# Patient Record
Sex: Female | Born: 1965 | Race: Black or African American | Hispanic: No | Marital: Single | Smoking: Never smoker
Health system: Southern US, Community
[De-identification: ages and names within clinical notes are randomized; demographics above are authoritative.]

## PROBLEM LIST (undated history)

## (undated) ENCOUNTER — Encounter

## (undated) ENCOUNTER — Telehealth

## (undated) ENCOUNTER — Encounter: Attending: Mental Health

## (undated) ENCOUNTER — Ambulatory Visit

## (undated) ENCOUNTER — Encounter: Attending: Physician Assistant

## (undated) ENCOUNTER — Inpatient Hospital Stay

## (undated) ENCOUNTER — Other Ambulatory Visit

## (undated) ENCOUNTER — Encounter: Attending: Internal Medicine

## (undated) ENCOUNTER — Telehealth: Attending: Internal Medicine

## (undated) DIAGNOSIS — I2699 Other pulmonary embolism without acute cor pulmonale: Secondary | ICD-10-CM

## (undated) DIAGNOSIS — I639 Cerebral infarction, unspecified: Secondary | ICD-10-CM

## (undated) DIAGNOSIS — M1990 Unspecified osteoarthritis, unspecified site: Secondary | ICD-10-CM

## (undated) DIAGNOSIS — M797 Fibromyalgia: Secondary | ICD-10-CM

## (undated) DIAGNOSIS — M3500 Sicca syndrome, unspecified: Secondary | ICD-10-CM

## (undated) DIAGNOSIS — M329 Systemic lupus erythematosus, unspecified: Secondary | ICD-10-CM

## (undated) DIAGNOSIS — I201 Angina pectoris with documented spasm: Secondary | ICD-10-CM

## (undated) DIAGNOSIS — IMO0002 Reserved for concepts with insufficient information to code with codable children: Secondary | ICD-10-CM

## (undated) HISTORY — PX: CHOLECYSTECTOMY: SHX55

---

## 2016-07-09 DIAGNOSIS — R748 Abnormal levels of other serum enzymes: Principal | ICD-10-CM

## 2016-07-09 DIAGNOSIS — K7200 Acute and subacute hepatic failure without coma: Secondary | ICD-10-CM

## 2016-07-09 DIAGNOSIS — R197 Diarrhea, unspecified: Secondary | ICD-10-CM

## 2016-07-17 ENCOUNTER — Encounter: Attending: Physician Assistant | Primary: Internal Medicine

## 2016-08-20 ENCOUNTER — Encounter: Attending: Physician Assistant | Primary: Internal Medicine

## 2016-08-22 ENCOUNTER — Encounter: Attending: Internal Medicine | Primary: Internal Medicine

## 2016-11-19 DIAGNOSIS — M329 Systemic lupus erythematosus, unspecified: Secondary | ICD-10-CM

## 2016-11-19 DIAGNOSIS — G43009 Migraine without aura, not intractable, without status migrainosus: Principal | ICD-10-CM

## 2016-11-19 DIAGNOSIS — M3500 Sicca syndrome, unspecified: Secondary | ICD-10-CM

## 2016-11-20 MED ORDER — TOPIRAMATE 50 MG PO TABS
0 refills | Status: CP
Start: 2016-11-20 — End: 2017-01-21

## 2016-12-24 ENCOUNTER — Ambulatory Visit: Attending: Internal Medicine | Primary: Internal Medicine

## 2016-12-24 DIAGNOSIS — IMO0002 Atrial fib/flutter, transient: Secondary | ICD-10-CM

## 2016-12-24 DIAGNOSIS — M1990 Unspecified osteoarthritis, unspecified site: Secondary | ICD-10-CM

## 2016-12-24 DIAGNOSIS — G459 Transient cerebral ischemic attack, unspecified: Secondary | ICD-10-CM

## 2016-12-24 DIAGNOSIS — I2510 Atherosclerotic heart disease of native coronary artery without angina pectoris: Secondary | ICD-10-CM

## 2016-12-24 DIAGNOSIS — M797 Fibromyalgia: Secondary | ICD-10-CM

## 2016-12-24 DIAGNOSIS — J449 Chronic obstructive pulmonary disease, unspecified: Secondary | ICD-10-CM

## 2016-12-24 DIAGNOSIS — M3500 Sicca syndrome, unspecified: Secondary | ICD-10-CM

## 2016-12-24 DIAGNOSIS — I2699 Other pulmonary embolism without acute cor pulmonale: Secondary | ICD-10-CM

## 2016-12-24 DIAGNOSIS — M3501 Sicca syndrome with keratoconjunctivitis: Secondary | ICD-10-CM

## 2016-12-24 DIAGNOSIS — I8290 Acute embolism and thrombosis of unspecified vein: Secondary | ICD-10-CM

## 2016-12-24 DIAGNOSIS — E785 Hyperlipidemia, unspecified: Secondary | ICD-10-CM

## 2016-12-24 DIAGNOSIS — M329 Systemic lupus erythematosus, unspecified: Principal | ICD-10-CM

## 2016-12-24 DIAGNOSIS — I10 Essential (primary) hypertension: Principal | ICD-10-CM

## 2016-12-24 DIAGNOSIS — R251 Tremor, unspecified: Secondary | ICD-10-CM

## 2016-12-24 MED ORDER — CYCLOBENZAPRINE HCL 5 MG PO TABS
5 mg | Freq: Two times a day (BID) | ORAL | 0 refills | Status: CP | PRN
Start: 2016-12-24 — End: 2017-01-15

## 2016-12-24 NOTE — Progress Notes
Teaching/Attestation Statement  I saw and evaluated the patient.  I reviewed the Resident note and agree with the assessment and plan. See Resident's note for details.     Having neck spasm ; switched from baclofen to flexeril  Stop Plaquenil since no eye exam for > 2 years and she c/o poor vision when she took.  # Won't add any other additional meds for Sjogren's without  Evidence of active disease .  # counsel exercise and sleep hygiene

## 2016-12-24 NOTE — Progress Notes
hospitalized 2 times since last being seen for her IBS-D. Denies any other complaints during that time or any surgical procedure. Per pt she still has not see the Ophthalmologist and ran out of her plaquenil about 1 week ago. She also has complaints of continued Sicca symptoms as well as sharp shooting pains in her upper back and shoulders for the past day.    The pain is At   Knees , no swelling Morning stiffness and tender +  Patient describes the level of pain as  7 of 10.   The pain was first noticed    6 Years     It usually lasts  * always there.   When is the problem worst?  No specific time   Does anything make the problem worse?  moving around, walking ,standing , driving ,lying on my side   Does anything help or make the problem better?no     CONSTITUTIONAL SYMPTOMS:No Fever, Weight Loss or Night Sweats , +++fatigue    Review of systems for connective tissue disease revealed:   No Photosensitivity,malar rash ,discoid rash , hair loss, mouth ulcer , dysphagia, dyspnea, chest pain,SOB,  abdominal pain, blood in stool ,easy bruising  paraesthesia, weakness or Raynaud's  + nausea and heart burn     + sicca         ROS FOR FMS: Patient is reviewed for fibromyalgia symptoms.  Sleep difficulty +   Exercise :  None.      Associated symptoms:   Migraine headaches.    Irritable bowel symptoms.  ,Depression:      Review Of Systems: All other ROS were reviewed and were unremarkable      . Rheumatology Family Hx: no family hx related to rheumatology noted  Previous Report(s) Reviewed:  lab reports     Outpatient Medications:  Current Outpatient Medications    Medication Sig Start Date End Date Taking? Authorizing Provider   baclofen (LIORESAL) 10 MG Tablet Take 1 tablet by mouth 2 times daily. 04/04/16  Yes Thway, Myint Vic Blackbird, MD   diltiaZEM (DILT-XR) 180 MG Capsule Extended Release 24 Hour TAKE 1 CAPSULE BY MOUTH DAILY 02/12/16  Yes Gretchen Short, MD

## 2016-12-24 NOTE — Progress Notes
Will stop plaquenil for now as pt has not seen opthalmology since 6/16.   Curently on pilocarpine for Sicca symptoms, now with complaints of worsening dry eye. Pt told to continue with OTC eye drops scheduled 3-4 time daily.  Pt counseled on the need for regular Dental Appointments every 6 months.   Will need Lab work done prior to next appt; for CBC, CMP, ESR, CRP, C3, C4, ds-DNA and UA     FMS: Reports poor sleep with trouble falling asleep and staying asleep as well as daytime fatigue.  Pt counseled on need for better sleep hygiene including setting a scheduled bedtime, no day time napping, appropriate bedroom environment as well scheduled exercise program ( a least 30 mins of activity daily)  Currently on gabapentin and Zoloft  Will stop baclofen at this time and start pt on short course of flexeril to help with muscle spasms.    Clinically stable     follow up 6 -8 weeks with requested labwork.      Noberto Retort, MD   IM PGY-3  12/24/2016  3:40 PM

## 2016-12-24 NOTE — Progress Notes
ALBUMIN/GLOBULIN RATIO 1.2 1.0 - 2.5 (calc)    Total Bilirubin 0.4 0.2 - 1.2 mg/dL    Alkaline Phosphatase 64 33 - 115 U/L    AST 13 10 - 35 U/L    ALT 6 6 - 29 U/L   Urinalysis W Microscopy (No Culture)([facility])   Result Value Ref Range    Color -Ur YELLOW YELLOW    APPEARANCE CLEAR CLEAR    Specific Gravity -Ur 1.019 1.001 - 1.035    pH -Ur 7.5 5.0 - 8.0    Glucose -Ur NEGATIVE NEGATIVE    Bilirubin -Ur NEGATIVE NEGATIVE    Ketones UA NEGATIVE NEGATIVE    Hb -Ur NEGATIVE NEGATIVE    Protein-UA NEGATIVE NEGATIVE    NITRITE NEGATIVE NEGATIVE    Leukocyte Esterase -Ur NEGATIVE NEGATIVE    WBC -Ur NONE SEEN < OR = 5 /HPF    RBC -Ur NONE SEEN < OR = 2 /HPF    Squam Epithel, UA 0-5 < OR = 5 /HPF    BACTERIA NONE SEEN NONE SEEN /HPF    Hyaline Casts, UA NONE SEEN NONE SEEN /LPF   Sedimentation Rate   Result Value Ref Range    SED RATE BY MODIFIED WESTERGREN 17 < OR = 20 mm/h   Anti DNA, Double Stranded   Result Value Ref Range    dsDNA Ab 18 (H) IU/mL   CBC and Differential   Result Value Ref Range    WHITE BLOOD CELL COUNT 3.4 (L) 3.8 - 10.8 Thousand/uL    RBC 4.75 3.80 - 5.10 Million/uL    Hemoglobin 13.4 11.7 - 15.5 g/dL    Hematocrit 40.2 35.0 - 45.0 %    MCV 84.6 80.0 - 100.0 fL    MCH 28.2 27.0 - 33.0 pg    MCHC 33.3 32.0 - 36.0 g/dL    RDW 13.1 11.0 - 15.0 %    Platelets 216 140 - 400 Thousand/uL    MPV 10.1 7.5 - 12.5 fL    Neutrophils Absolute 925 (L) 1500 - 7800 cells/uL    Lymphocytes Absolute 1975 850 - 3900 cells/uL    Monocytes Absolute 350 200 - 950 cells/uL    Eosinophils Absolute 129 15 - 500 cells/uL    Basophils Absolute 20 0 - 200 cells/uL    Neutrophils 27.2 %    LYMPHOCYTES 58.1 %    MONOCYTES 10.3 %    EOSINOPHILS 3.8 %    BASOPHILS 0.6 %       [facility] records  Last visit march 30  SLE Dx 2010 , anti SSA, SSB + , borderline anti ds dna, diffuse arthralgia since 2007  FMS  Complex migraine  But in 2010 labs RF 25, SSA 118, SSB 26, Ds DNA 200 - overall mild disease

## 2016-12-24 NOTE — Progress Notes
hair distribution   Eyes  conjunctivae/corneas clear. PERTL, EOMI    HEENT Lips, mucosa very  dry  .no oral ulceration   Neck supple, symmetrical, trachea midline, no adenopathy,thyroid: not enlarged,  no JVD   Back   Normal posture, alignment  +  tenderness to palpation of spinous processes.? muscle spasm in neck and trapezius   ?   Lungs   Not tachypnea . Normal respiratory effort  clear to auscultation bilaterally       Heart  regular rate and rhythm, S1, S2 normal, no murmur, click, rub or gallop   Abdomen   soft, non-tender. Bowel sounds normal. No masses,  No organomegaly   MSK No objective  synovitis , dactylitis in complete exam.  FMS tender point 16/18*   Extremities extremities normal, atraumatic, no cyanosis or edema   Pulses 2+ and symmetric   Skin Skin color, texture, turgor normal. No rashes or lesions   Lymph nodes Cervical, supraclavicular, and axillary nodes normal.   Neurologic MENTAL STATUS: Alert. Oriented to person, place, and time. Intact recent and remote memory. No aphasia.   CRANIAL NERVES: Cranial nerves II-XII were normal   No motor /sensory deficit        Lab or Diagnostic Testing:     Results for orders placed or performed in visit on 02/15/16   C3 Complement   Result Value Ref Range    C3 Complement 113 90 - 180 mg/dL   C4 Complement   Result Value Ref Range    C4 Complement 21 16 - 47 mg/dL   Comprehensive Metabolic Panel   Result Value Ref Range    Glucose 84 65 - 99 mg/dL    Urea Nitrogen 14 7 - 25 mg/dL    Creatinine 1.01 0.50 - 1.10 mg/dL    EGFR 65 > OR = 60 mL/min/1.57m    Glom Filt Rate, Est African American 76 > OR = 60 mL/min/1.739m   BUN/Creatinine Ratio NOT APPLICABLE 6 - 22 (calc)    Sodium 138 135 - 146 mmol/L    Potassium 3.8 3.5 - 5.3 mmol/L    Chloride 103 98 - 110 mmol/L    CARBON DIOXIDE 23 20 - 31 mmol/L    Calcium 9.7 8.6 - 10.2 mg/dL    Protein, Total 7.9 6.1 - 8.1 g/dL    ALBUMIN 4.3 3.6 - 5.1 g/dL    Globulin 3.6 1.9 - 3.7 g/dL (calc)

## 2016-12-24 NOTE — Progress Notes
Procedure Laterality Date   ? CHOLECYSTECTOMY      C-Section   ? CHOLECYSTECTOMY      Surgery Description: Cholecystectomy;   (Created by Conversion)       Family History:     Family History   Problem Relation Age of Onset   ? Asthma Mother    ? Heart Disease Mother    ? Hypertension Mother    ? Stroke Mother    ? Vision Loss Mother    ? Asthma Brother    ? Birth Defects Brother    ? Early Death Brother    ? Heart Disease Brother    ? High Cholesterol Brother    ? Hypertension Brother    ? Asthma Daughter    ? High Cholesterol Daughter    ? Hypertension Daughter    ? Asthma Maternal Aunt    ? Heart Disease Maternal Aunt    ? Hypertension Maternal Aunt    ? Miscarriages / Stillbirths Maternal Aunt    ? Stroke Maternal Aunt    ? Vision Loss Maternal Aunt    ? Heart Disease Maternal Uncle    ? Hypertension Maternal Uncle    ? Stroke Maternal Uncle    ? Arthritis Maternal Grandmother    ? Asthma Maternal Grandmother    ? Clotting Disorder Maternal Grandmother    ? Heart Disease Maternal Grandmother    ? Hypertension Maternal Grandmother    ? Miscarriages / Stillbirths Maternal Grandmother    ? Vision Loss Maternal Grandmother        Social History:     Social History     Social History   ? Marital status: Single     Spouse name: N/A   ? Number of children: N/A   ? Years of education: N/A     Social History Main Topics   ? Smoking status: Never Smoker   ? Smokeless tobacco: Never Used   ? Alcohol use No   ? Drug use: No   ? Sexual activity: Not Currently     Other Topics Concern   ? None     Social History Narrative       Physical Exam:   BP 128/79 - Pulse 53 - Temp 36.1 ?C (97 ?F) - Resp 14 - Ht 1.702 m (5\' 7" ) - Wt 78.1 kg (172 lb 3.2 oz) - BMI 26.97 kg/m2      General appearance  alert, cooperative, no distress, appears stated age, obese    Psychiatric/Behavioral:  Negative for behavioral problems, confusion and agitation.          Head  Normocephalic, without obvious abnormality, no scalp lesion  normal

## 2016-12-24 NOTE — Progress Notes
gabapentin (NEURONTIN) 600 MG Tablet TAKE 1 TABLET BY MOUTH THREE TIMES DAILY 02/12/16  Yes Gretchen Short, MD   hydroCHLOROthiazide (HYDRODIURIL) 25 MG Tablet TAKE 1 TABLET BY MOUTH DAILY 09/20/15  Yes Gretchen Short, MD   hydroCHLOROthiazide (HYDRODIURIL) 25 MG Tablet TAKE 1 TABLET BY MOUTH DAILY 02/12/16  Yes Gretchen Short, MD   hydroxychloroquine (PLAQUENIL) 200 MG Tablet Take 1 tablet by mouth 2 times daily. 01/31/16  Yes Thway, Myint Myat, MD   magnesium oxide (MAG-OX) 400 MG tablet Take 400 mg by mouth daily.   Yes Information, Historical   meclizine (ANTIVERT) 25 MG Tablet  01/05/15  Yes Information, Historical   moxifloxacin (VIGAMOX) 0.5 % Solution Place 1 drop into both eyes 3 times daily. 03/01/16  Yes Gretchen Short, MD   pilocarpine (SALAGEN) 5 MG Tablet Take 1 tablet by mouth 2 times daily. 02/20/16  Yes Thway, Myint Vic Blackbird, MD   prochlorperazine (COMPAZINE) 10 MG Tablet Take 1 Tablet by mouth every 8 hours as needed. 04/03/15  Yes [name], ARNP   sertraline (ZOLOFT) 50 MG Tablet TAKE 1 TABLET BY MOUTH EVERY DAY 02/12/16  Yes Gretchen Short, MD   solifenacin (VESICARE) 5 MG Tablet Take 1 tablet by mouth daily. 02/12/16  Yes Gretchen Short, MD   topiramate (TOPAMAX) 50 MG PO Tablet TAKE 2 TABLETS BY MOUTH TWICE DAILY 11/20/16  Yes [name], ARNP   vitamin D-2 (ERGOCALCIFEROL) 50000 UNITS Capsule TAKE 1 CAPSULE BY MOUTH 1 TIME A WEEK ON AN EMPTY STOMACH 03/30/15  Yes Thway, Arlyss Gandy, MD       Allergies:  Allergies   Allergen Reactions   ? No Known Drug Allergy      No Known Drug Allergies       PMH:   Past Medical History:   Diagnosis Date   ? Atrial fib/flutter, transient    ? Blood clot in vein    ? CAD (coronary artery disease)    ? COPD (chronic obstructive pulmonary disease)    ? Hyperlipemia    ? Hypertension    ? Lupus    ? Occasional tremors    ? Osteoarthritis    ? Pulmonary embolism    ? Sjogren's syndrome    ? TIA (transient ischemic attack)        PSH:  Past Surgical History:

## 2016-12-24 NOTE — Progress Notes
Ineffecive response to Pla   Rx SSZ 1000mg  bid (started 3/15)  Lack of response to tramadol  Gabapentin 600mg  tid  Pred short taper   MRI lumbar spine -NO sig   One episode of WBC 3.7       Radiology:    05/27/15  [facility]   Electrophysiologic findings:  1. Normal sural sensory conduction studies bilaterally.  2. Absent superficial peroneal sensory responses bilaterally. The symmetric absence responses a normal finding.  3. Reduced amplitude left tibial motor response with a normal distal latency. The right tibial motor conduction was normal.  4. Normal common peroneal motor conductions bilaterally.  5. Absent common peroneal F-wave responses bilaterally. The symmetric absence of responses is sometimes a normal finding and female patients.  6. Prolonged right tibial F-wave latency. The left tibial F-wave latency was normal.  7. Prolonged tibial H reflex responses bilaterally.  ?  Needle EMG: EMG in the left leg was normal in the tibialis anterior and gastrocnemius muscles.  ?  Impression: Electrophysiologic study was abnormal with findings that seem to be most consistent with mild lumbosacral radicular dysfunction as evidenced by the prolonged late responses, low amplitude distal motor response but normal sensory conductions. The absence of any EMG abnormality indicates that there is not significant motor axon loss associated. We did not find any evidence here to suggest that there is a large fiber peripheral neuropathy. Her symptoms do not seem to be consistent with a small fiber neuropathy    ASSESSMENT:     Problem List Items Addressed This Visit     Systemic lupus erythematosus    Sicca syndrome    Sjogrens syndrome    Fibromyalgia          PLAN:     Counseling was given regarding impression.     Sjogren's /SLE: She has Pos ANA 1:1280 in 2012. Previously treated with PLa and SSZ at [facility] .  Now repeat labs consistent with  + SSA , negative SSB, mildly low C4 and low level pos anti Ds DNA   Negative RF and ant CCP

## 2016-12-24 NOTE — Progress Notes
Department of Rheumatology    Patient: Nichole Colon  MRN: [id number]  DOB: 1966/06/03        Chief Complaint:  Follow-up (6 Months)    Patient Active Problem List   Diagnosis   ? Plantar fascial fibromatosis   ? Systemic lupus erythematosus   ? Sicca syndrome   ? Osteoarthrosis involving lower leg   ? Other disorder of menstruation and other abnormal bleeding from female genital tract   ? Unspecified transient cerebral ischemia   ? Other chronic pain   ? Other malaise and fatigue   ? Routine general medical examination at a health care facility   ? Allergy, unspecified not elsewhere classified   ? Chronic airway obstruction, not elsewhere classified   ? Essential hypertension   ? Gonococcal infection (acute) of lower genitourinary tract   ? Other and unspecified hyperlipidemia   ? Pain in limb   ? Sjogrens syndrome   ? Fibromyalgia   ? Migraine without aura and without status migrainosus, not intractable   ? Chronic bilateral low back pain without sciatica   ? OAB (overactive bladder)   ? Body mass index 28.0-28.9, adult       History of Present Illness:     Nichole Colon is an 51 y.o. AA female who is here for   Sjogren's  Disease and fibromyalgia .    Past history   She has been following  with Dr Goal for several years and established Dx of lupus/Sjogren's, Fibromyalgia and OA .   Her last visit was 03/14/14. Her insurance changed   She was Dx  Sj syndrome  in 2010 , was treated with Plaquenil then switched to SSZ . She was on SSZ 2 months and stopped because of nausea . Plq also gave her dry skin and it was stopped .  She was not on any med in her first visit 03/06/15  except for FMS.  Neurontin, baclofen and mobic prn   I also received notes from [facility]   They Dx SLE (2010) anti SSA, SSB  + , borderline Ds DNA , negative APL study   She was restarted PLq and salagen (03/30/2015)  .      12/24/16   She is here for follow up. Last seen 3/17. Reports that she was

## 2017-01-13 ENCOUNTER — Ambulatory Visit: Attending: Ophthalmology | Primary: Internal Medicine

## 2017-01-13 DIAGNOSIS — M1990 Unspecified osteoarthritis, unspecified site: Secondary | ICD-10-CM

## 2017-01-13 DIAGNOSIS — I8290 Acute embolism and thrombosis of unspecified vein: Secondary | ICD-10-CM

## 2017-01-13 DIAGNOSIS — M3501 Sicca syndrome with keratoconjunctivitis: Principal | ICD-10-CM

## 2017-01-13 DIAGNOSIS — R251 Tremor, unspecified: Secondary | ICD-10-CM

## 2017-01-13 DIAGNOSIS — Z79899 Other long term (current) drug therapy: Secondary | ICD-10-CM

## 2017-01-13 DIAGNOSIS — M329 Systemic lupus erythematosus, unspecified: Secondary | ICD-10-CM

## 2017-01-13 DIAGNOSIS — H04123 Dry eye syndrome of bilateral lacrimal glands: Secondary | ICD-10-CM

## 2017-01-13 DIAGNOSIS — E785 Hyperlipidemia, unspecified: Secondary | ICD-10-CM

## 2017-01-13 DIAGNOSIS — H40003 Preglaucoma, unspecified, bilateral: Secondary | ICD-10-CM

## 2017-01-13 DIAGNOSIS — I2510 Atherosclerotic heart disease of native coronary artery without angina pectoris: Secondary | ICD-10-CM

## 2017-01-13 DIAGNOSIS — J449 Chronic obstructive pulmonary disease, unspecified: Secondary | ICD-10-CM

## 2017-01-13 DIAGNOSIS — G459 Transient cerebral ischemic attack, unspecified: Secondary | ICD-10-CM

## 2017-01-13 DIAGNOSIS — I2699 Other pulmonary embolism without acute cor pulmonale: Secondary | ICD-10-CM

## 2017-01-13 DIAGNOSIS — M3500 Sicca syndrome, unspecified: Secondary | ICD-10-CM

## 2017-01-13 DIAGNOSIS — I10 Essential (primary) hypertension: Principal | ICD-10-CM

## 2017-01-13 DIAGNOSIS — IMO0002 Atrial fib/flutter, transient: Secondary | ICD-10-CM

## 2017-01-13 MED ORDER — CYCLOSPORINE 0.05 % OP EMUL
1 [drp] | Freq: Two times a day (BID) | OPHTHALMIC | 11 refills | Status: CP
Start: 2017-01-13 — End: ?

## 2017-01-13 NOTE — Progress Notes
?   High Cholesterol Brother    ? Hypertension Brother    ? Asthma Daughter    ? High Cholesterol Daughter    ? Hypertension Daughter    ? Asthma Maternal Aunt    ? Heart Disease Maternal Aunt    ? Hypertension Maternal Aunt    ? Miscarriages / Stillbirths Maternal Aunt    ? Stroke Maternal Aunt    ? Vision Loss Maternal Aunt    ? Heart Disease Maternal Uncle    ? Hypertension Maternal Uncle    ? Stroke Maternal Uncle    ? Arthritis Maternal Grandmother    ? Asthma Maternal Grandmother    ? Clotting Disorder Maternal Grandmother    ? Heart Disease Maternal Grandmother    ? Hypertension Maternal Grandmother    ? Miscarriages / Stillbirths Maternal Grandmother    ? Vision Loss Maternal Grandmother        Social History     Social History   ? Marital status: Single     Spouse name: N/A   ? Number of children: N/A   ? Years of education: N/A     Occupational History   ? Not on file.     Social History Main Topics   ? Smoking status: Never Smoker   ? Smokeless tobacco: Never Used   ? Alcohol use No   ? Drug use: No   ? Sexual activity: Not Currently     Other Topics Concern   ? Not on file     Social History Narrative       Review of Systems:   Sjogren's disease and KCS      Objective:     Base Eye Exam     Visual Acuity (Snellen - Linear)      Right Left   Dist sc 20/20 20/20   Near cc J1 J1+ (-1)         Tonometry (Applanation, 2:13 PM)      Right Left   Pressure 12 09         Pupils      Dark Light React APD   Right 3 3 Slow None   Left 3 3 Slow None         Visual Fields      Right Left   Result Full Full         Extraocular Movement      Right Left   Result Full Full         Neuro/Psych     Oriented x3:  Yes    Mood/Affect:  Normal      Dilation     Both eyes:  1.0% Mydriacyl @ 2:15 PM            Additional Tests     Color      Right Left   Ishihara 16/16 16/16               Slit Lamp and Fundus Exam     External Exam      Right Left    External Normal Normal      Slit Lamp Exam      Right Left

## 2017-01-13 NOTE — Progress Notes
Visit Date:  01/13/2017     Referring Physician:  Dr. Bryna Colander    Patient Active Problem List    Diagnosis Date Noted   ? Body mass index 28.0-28.9, adult 03/22/2016   ? OAB (overactive bladder) 06/28/2015   ? Chronic bilateral low back pain without sciatica 05/27/2015   ? Migraine without aura and without status migrainosus, not intractable 03/01/2015   ? Sjogrens syndrome 01/30/2015   ? Fibromyalgia 01/30/2015   ? Chronic airway obstruction, not elsewhere classified 03/28/2011   ? Plantar fascial fibromatosis 03/07/2011   ? Pain in limb 03/07/2011   ? Osteoarthrosis involving lower leg 11/06/2010   ? Sicca syndrome 10/19/2010   ? Other disorder of menstruation and other abnormal bleeding from female genital tract 10/12/2010   ? Gonococcal infection (acute) of lower genitourinary tract 10/12/2010   ? Allergy, unspecified not elsewhere classified 08/29/2010   ? Other chronic pain 05/03/2010   ? Systemic lupus erythematosus 04/20/2010   ? Unspecified transient cerebral ischemia 04/20/2010   ? Other malaise and fatigue 04/20/2010   ? Essential hypertension 04/20/2010   ? Other and unspecified hyperlipidemia 04/20/2010   ? Routine general medical examination at a health care facility 12/16/1898       Chief Complaint   Patient presents with   ? Plaquenil Screening              Subjective:     History of Present Illness:  Nichole Colon is a 51 y.o. female who is here today for interval plaquenil screening.    Allergies   Allergen Reactions   ? No Known Drug Allergy      No Known Drug Allergies       Current Outpatient Prescriptions   Medication Sig Dispense Refill   ? cyclobenzaprine (FLEXERIL) 5 MG PO Tablet Take 1 tablet by mouth 2 times daily as needed for muscle spasms. 20 tablet 0   ? diltiaZEM (DILT-XR) 180 MG Capsule Extended Release 24 Hour TAKE 1 CAPSULE BY MOUTH DAILY 90 capsule 3   ? gabapentin (NEURONTIN) 600 MG Tablet TAKE 1 TABLET BY MOUTH THREE TIMES DAILY 270 tablet 3

## 2017-01-13 NOTE — Progress Notes
?   hydroCHLOROthiazide (HYDRODIURIL) 25 MG Tablet TAKE 1 TABLET BY MOUTH DAILY 30 Tablet 0   ? hydroCHLOROthiazide (HYDRODIURIL) 25 MG Tablet TAKE 1 TABLET BY MOUTH DAILY 90 tablet 3   ? hydroxychloroquine (PLAQUENIL) 200 MG Tablet Take 1 tablet by mouth 2 times daily. 90 tablet 3   ? magnesium oxide (MAG-OX) 400 MG tablet Take 400 mg by mouth daily.     ? meclizine (ANTIVERT) 25 MG Tablet      ? moxifloxacin (VIGAMOX) 0.5 % Solution Place 1 drop into both eyes 3 times daily. 3 mL 0   ? pilocarpine (SALAGEN) 5 MG Tablet Take 1 tablet by mouth 2 times daily. 60 tablet 3   ? prochlorperazine (COMPAZINE) 10 MG Tablet Take 1 Tablet by mouth every 8 hours as needed. 40 Tablet 3   ? sertraline (ZOLOFT) 50 MG Tablet TAKE 1 TABLET BY MOUTH EVERY DAY 90 tablet 3   ? solifenacin (VESICARE) 5 MG Tablet Take 1 tablet by mouth daily. 90 tablet 3   ? topiramate (TOPAMAX) 50 MG PO Tablet TAKE 2 TABLETS BY MOUTH TWICE DAILY 360 tablet 0   ? vitamin D-2 (ERGOCALCIFEROL) 50000 UNITS Capsule TAKE 1 CAPSULE BY MOUTH 1 TIME A WEEK ON AN EMPTY STOMACH 13 capsule 0     No current facility-administered medications for this visit.         Past Medical History:   Diagnosis Date   ? Atrial fib/flutter, transient    ? Blood clot in vein    ? CAD (coronary artery disease)    ? COPD (chronic obstructive pulmonary disease)    ? Hyperlipemia    ? Hypertension    ? Lupus    ? Occasional tremors    ? Osteoarthritis    ? Pulmonary embolism    ? Sjogren's syndrome    ? TIA (transient ischemic attack)        Past Surgical History:   Procedure Laterality Date   ? CHOLECYSTECTOMY      C-Section   ? CHOLECYSTECTOMY      Surgery Description: Cholecystectomy;   (Created by Conversion)        Family History   Problem Relation Age of Onset   ? Asthma Mother    ? Heart Disease Mother    ? Hypertension Mother    ? Stroke Mother    ? Vision Loss Mother    ? Asthma Brother    ? Birth Defects Brother    ? Early Death Brother    ? Heart Disease Brother

## 2017-01-13 NOTE — Progress Notes
Lids/Lashes Normal Normal    Conjunctiva/Sclera White and quiet White and quiet    Cornea tr pee tr pee    Anterior Chamber Deep and quiet Deep and quiet    Iris Round and reactive Round and reactive    Lens Clear Clear    Vitreous Normal Normal      Fundus Exam      Right Left    Disc Normal Normal    C/D Ratio 0.4 0.55    Macula Normal Normal    Vessels Normal Superior vein emergens nasally and then loops out    Periphery Normal Normal            Refraction     Wearing Rx      Sphere   Right +2.00   Left +2.00       Type:  SVL (Reading Glasses)                No notes on file    Other testing:   None      Assessment and Plan:       ICD-10-CM ICD-9-CM    1. Sjogren's syndrome with keratoconjunctivitis sicca M35.01 710.2     Start Restasis OU BID and Pred-forte 1 gtt OU QID x 14 days.   2. Long-term use of Plaquenil Z79.899 V58.69     Schedule HVF today for next week, OCT today is WNL OU.   3. Dry eyes, bilateral H04.123 375.15     Ats OU QID.   4. Glaucoma suspect, bilateral H40.003 365.00     CDR asymmetry, schedule OCT and HVF for glaucoma suspect.          Return to clinic:  2  week(s) for HVF 24-2 for glaucoma suspect and 10-2 red target , OCT for ON    Liam Rogers, MD

## 2017-01-13 NOTE — Addendum Note
Addended by: Liam Rogers on: 01/13/2017 02:59 PM      Modules accepted: Orders

## 2017-01-14 DIAGNOSIS — M3501 Sicca syndrome with keratoconjunctivitis: Principal | ICD-10-CM

## 2017-01-14 MED ORDER — PREDNISOLONE ACETATE 1 % OP SUSP
1 [drp] | Freq: Four times a day (QID) | OPHTHALMIC | 0 refills | Status: CP
Start: 2017-01-14 — End: 2018-02-17

## 2017-01-14 NOTE — Telephone Encounter
Rx for PF QID OU for 14 days was sent [facility] pharmacy  .  Spoke with the pharmacist and changes restasis to 60 vials in place of 30

## 2017-01-14 NOTE — Telephone Encounter
Rx for PF QID OU for 14 days was sent [facility] pharmacy

## 2017-01-15 ENCOUNTER — Ambulatory Visit: Attending: Internal Medicine | Primary: Internal Medicine

## 2017-01-15 DIAGNOSIS — G459 Transient cerebral ischemic attack, unspecified: Secondary | ICD-10-CM

## 2017-01-15 DIAGNOSIS — Z124 Encounter for screening for malignant neoplasm of cervix: Secondary | ICD-10-CM

## 2017-01-15 DIAGNOSIS — I2510 Atherosclerotic heart disease of native coronary artery without angina pectoris: Secondary | ICD-10-CM

## 2017-01-15 DIAGNOSIS — I10 Essential (primary) hypertension: Principal | ICD-10-CM

## 2017-01-15 DIAGNOSIS — I2699 Other pulmonary embolism without acute cor pulmonale: Secondary | ICD-10-CM

## 2017-01-15 DIAGNOSIS — M329 Systemic lupus erythematosus, unspecified: Secondary | ICD-10-CM

## 2017-01-15 DIAGNOSIS — R609 Edema, unspecified: Secondary | ICD-10-CM

## 2017-01-15 DIAGNOSIS — IMO0002 Atrial fib/flutter, transient: Secondary | ICD-10-CM

## 2017-01-15 DIAGNOSIS — Z1211 Encounter for screening for malignant neoplasm of colon: Secondary | ICD-10-CM

## 2017-01-15 DIAGNOSIS — N3281 Overactive bladder: Secondary | ICD-10-CM

## 2017-01-15 DIAGNOSIS — J449 Chronic obstructive pulmonary disease, unspecified: Secondary | ICD-10-CM

## 2017-01-15 DIAGNOSIS — F323 Major depressive disorder, single episode, severe with psychotic features: Secondary | ICD-10-CM

## 2017-01-15 DIAGNOSIS — Z0000 Encounter for general adult medical examination without abnormal findings: Secondary | ICD-10-CM

## 2017-01-15 DIAGNOSIS — M3500 Sicca syndrome, unspecified: Secondary | ICD-10-CM

## 2017-01-15 DIAGNOSIS — M1990 Unspecified osteoarthritis, unspecified site: Secondary | ICD-10-CM

## 2017-01-15 DIAGNOSIS — R251 Tremor, unspecified: Secondary | ICD-10-CM

## 2017-01-15 DIAGNOSIS — Z6828 Body mass index (BMI) 28.0-28.9, adult: Principal | ICD-10-CM

## 2017-01-15 DIAGNOSIS — E785 Hyperlipidemia, unspecified: Secondary | ICD-10-CM

## 2017-01-15 DIAGNOSIS — E559 Vitamin D deficiency, unspecified: Secondary | ICD-10-CM

## 2017-01-15 DIAGNOSIS — R42 Dizziness and giddiness: Secondary | ICD-10-CM

## 2017-01-15 DIAGNOSIS — I8290 Acute embolism and thrombosis of unspecified vein: Secondary | ICD-10-CM

## 2017-01-15 DIAGNOSIS — Z1231 Encounter for screening mammogram for malignant neoplasm of breast: Secondary | ICD-10-CM

## 2017-01-15 MED ORDER — DILTIAZEM HCL ER 180 MG PO CP24
3 refills | Status: CP
Start: 2017-01-15 — End: 2018-06-17

## 2017-01-15 MED ORDER — ERGOCALCIFEROL 1.25 MG (50000 UT) PO CAPS
0 refills | Status: CP
Start: 2017-01-15 — End: 2018-06-17

## 2017-01-15 MED ORDER — SERTRALINE HCL 50 MG PO TABS
3 refills | Status: CP
Start: 2017-01-15 — End: 2017-09-05

## 2017-01-15 MED ORDER — MECLIZINE HCL 25 MG PO TABS
25 mg | Freq: Three times a day (TID) | ORAL | 2 refills | Status: CP | PRN
Start: 2017-01-15 — End: 2017-02-10

## 2017-01-15 MED ORDER — SOLIFENACIN SUCCINATE 5 MG PO TABS
5 mg | Freq: Every day | ORAL | 3 refills | Status: CP
Start: 2017-01-15 — End: 2017-07-11

## 2017-01-15 MED ORDER — HYDROCHLOROTHIAZIDE 25 MG PO TABS
3 refills | Status: CP
Start: 2017-01-15 — End: 2018-03-13

## 2017-01-15 NOTE — Progress Notes
?   Clotting Disorder Maternal Grandmother    ? Heart Disease Maternal Grandmother    ? Hypertension Maternal Grandmother    ? Miscarriages / Stillbirths Maternal Grandmother    ? Vision Loss Maternal Grandmother      Social History     Social History   ? Marital status: Single     Spouse name: N/A   ? Number of children: N/A   ? Years of education: N/A     Occupational History   ? Not on file.     Social History Main Topics   ? Smoking status: Never Smoker   ? Smokeless tobacco: Never Used   ? Alcohol use No   ? Drug use: No   ? Sexual activity: Not Currently     Other Topics Concern   ? Not on file     Social History Narrative     Current Outpatient Prescriptions on File Prior to Visit   Medication Sig   ? cycloSPORINE (RESTASIS) 0.05 % OP Emulsion Place 1 drop into both eyes every 12 hours.   ? diltiaZEM (DILT-XR) 180 MG Capsule Extended Release 24 Hour TAKE 1 CAPSULE BY MOUTH DAILY   ? gabapentin (NEURONTIN) 600 MG Tablet TAKE 1 TABLET BY MOUTH THREE TIMES DAILY   ? hydroCHLOROthiazide (HYDRODIURIL) 25 MG Tablet TAKE 1 TABLET BY MOUTH DAILY   ? hydroxychloroquine (PLAQUENIL) 200 MG Tablet Take 1 tablet by mouth 2 times daily.   ? [DISCONTINUED] meclizine (ANTIVERT) 25 MG Tablet    ? pilocarpine (SALAGEN) 5 MG Tablet Take 1 tablet by mouth 2 times daily.   ? prednisoLONE acetate (PRED FORTE) 1 % OP Suspension Place 1 drop into both eyes 4 times daily for 14 days.   ? sertraline (ZOLOFT) 50 MG Tablet TAKE 1 TABLET BY MOUTH EVERY DAY   ? [DISCONTINUED] solifenacin (VESICARE) 5 MG Tablet Take 1 tablet by mouth daily.   ? topiramate (TOPAMAX) 50 MG PO Tablet TAKE 2 TABLETS BY MOUTH TWICE DAILY   ? [DISCONTINUED] vitamin D-2 (ERGOCALCIFEROL) 50000 UNITS Capsule TAKE 1 CAPSULE BY MOUTH 1 TIME A WEEK ON AN EMPTY STOMACH   ? [DISCONTINUED] cyclobenzaprine (FLEXERIL) 5 MG PO Tablet Take 1 tablet by mouth 2 times daily as needed for muscle spasms.   ? [DISCONTINUED] hydroCHLOROthiazide (HYDRODIURIL) 25 MG Tablet TAKE 1

## 2017-01-15 NOTE — Progress Notes
Assessment:       ICD-10-CM ICD-9-CM    1. Medicare annual wellness visit, subsequent Z00.00 V70.0    2. BMI 28.0-28.9,adult Z68.28 V85.24    3. Screening mammogram, encounter for Z12.31 V76.12 MAM Screening   4. Screening for malignant neoplasm of cervix Z12.4 V76.2 THINPREP TIS AND HPV mRNA E6/E7      THINPREP TIS AND HPV mRNA E6/E7   5. Hypovitaminosis D E55.9 268.9 ergocalciferol (vitamin D-2) 50000 units PO Capsule   6. OAB (overactive bladder) N32.81 596.51 solifenacin (VESICARE) 5 MG PO Tablet   7. Dizziness R42 780.4 meclizine (ANTIVERT) 25 MG PO Tablet          Plan:     Orders Placed This Encounter   Medications   ? ergocalciferol (vitamin D-2) 50000 units PO Capsule     Sig: TAKE 1 CAPSULE BY MOUTH 1 TIME A WEEK ON AN EMPTY STOMACH     Dispense:  13 capsule     Refill:  0     **Patient requests 90 days supply**   ? solifenacin (VESICARE) 5 MG PO Tablet     Sig: Take 1 tablet by mouth daily.     Dispense:  90 tablet     Refill:  3   ? meclizine (ANTIVERT) 25 MG PO Tablet     Sig: Take 1 tablet by mouth 3 times daily as needed.     Dispense:  60 tablet     Refill:  2     Orders Placed This Encounter   Procedures   ? MAM Screening       Health Maintenance was reviewed. The patient's HM Topic list was:                                            Health Maintenance   Topic Date Due   ? Breast Cancer Screening  02/12/2017 (Originally 03/28/2016)   ? Lipid Profile  02/18/2017   ? Basic Metabolic Panel  [id number]   ? Preventive Wellness Visit  01/15/2018   ? Pap Smear  01/16/2020   ? Colon Cancer Screening  06/15/2023   ? Zoster Vaccine (1) 03/28/2026   ? USPSTF HIV Risk Assessment  Completed   ? Influenza Vaccine  Addressed   ? DTaP,Tdap,and Td Vaccines  Excluded   ? Pneumovax / Prevnar  Excluded

## 2017-01-15 NOTE — Progress Notes
Functional Ability Assessment 01/15/2017   Eating Feeds self completely.   Continence: Complete bowel and/or bladder control, or able to self-cath.   Bathing: Bathes or showers without help.   Dressing: Dresses self completely.   Toileting: Uses toilet by self.   Transferring: Is able to move in and out of bed or chair.   Home Maintenance Independent   Cooking Independent   Laundry Independent   Telephone Independent   Finances Independent   Medications Independent   Transport Independent   Ambulation status Independent   Cognitive status:  The patient  does not have cognitive impairment;does not have problems with memory or concentration;does not have dementia        PHQ-9/Mood Disorder Scores 01/15/2017   PHQ-9 Total Score 0        No flowsheet data found.    Fall risk - Encounter Level              HEDIS 01/15/2017   Lipid Management  3048F - Most recent LDL-C level within 12 months [id number] mg/dL   Systolic blood pressure  0000000 - Systolic blood pressure [id number] Hg   Diastolic blood pressure [id number] - Diastolic blood pressure [id number] Hg   Documentation in record  1157F - Advanced Care Planning documentation in record   Discussion documented in record 1158F - [place] discussion documented in record   Medication list documented 1159F - Medication list documented in medical record   Medication reviewed and reconciled 1160F - Medication review and reconciliation   Functional Status Assessment 1170F - Functional Status Assessment (ADL's, IADL's, Ambulation Status, Cognitive Status)   Eating Feeds self completely.   Continence: Complete bowel and/or bladder control, or able to self-cath.   Bathing: Bathes or showers without help.   Dressing: Dresses self completely.   Toileting: Uses toilet by self.   Transferring: Is able to move in and out of bed or chair.   Home Maintenance Independent   Cooking Independent   Teacher, music

## 2017-01-15 NOTE — Progress Notes
?   Smokeless tobacco: Never Used   ? Alcohol use No     Current Outpatient Prescriptions on File Prior to Visit   Medication Sig   ? cycloSPORINE (RESTASIS) 0.05 % OP Emulsion Place 1 drop into both eyes every 12 hours.   ? diltiaZEM (DILT-XR) 180 MG Capsule Extended Release 24 Hour TAKE 1 CAPSULE BY MOUTH DAILY   ? gabapentin (NEURONTIN) 600 MG Tablet TAKE 1 TABLET BY MOUTH THREE TIMES DAILY   ? hydroCHLOROthiazide (HYDRODIURIL) 25 MG Tablet TAKE 1 TABLET BY MOUTH DAILY   ? hydroxychloroquine (PLAQUENIL) 200 MG Tablet Take 1 tablet by mouth 2 times daily.   ? [DISCONTINUED] meclizine (ANTIVERT) 25 MG Tablet    ? pilocarpine (SALAGEN) 5 MG Tablet Take 1 tablet by mouth 2 times daily.   ? prednisoLONE acetate (PRED FORTE) 1 % OP Suspension Place 1 drop into both eyes 4 times daily for 14 days.   ? sertraline (ZOLOFT) 50 MG Tablet TAKE 1 TABLET BY MOUTH EVERY DAY   ? [DISCONTINUED] solifenacin (VESICARE) 5 MG Tablet Take 1 tablet by mouth daily.   ? topiramate (TOPAMAX) 50 MG PO Tablet TAKE 2 TABLETS BY MOUTH TWICE DAILY   ? [DISCONTINUED] vitamin D-2 (ERGOCALCIFEROL) 50000 UNITS Capsule TAKE 1 CAPSULE BY MOUTH 1 TIME A WEEK ON AN EMPTY STOMACH   ? [DISCONTINUED] cyclobenzaprine (FLEXERIL) 5 MG PO Tablet Take 1 tablet by mouth 2 times daily as needed for muscle spasms.   ? [DISCONTINUED] hydroCHLOROthiazide (HYDRODIURIL) 25 MG Tablet TAKE 1 TABLET BY MOUTH DAILY   ? [DISCONTINUED] magnesium oxide (MAG-OX) 400 MG tablet Take 400 mg by mouth daily.   ? [DISCONTINUED] moxifloxacin (VIGAMOX) 0.5 % Solution Place 1 drop into both eyes 3 times daily.   ? [DISCONTINUED] prochlorperazine (COMPAZINE) 10 MG Tablet Take 1 Tablet by mouth every 8 hours as needed.     No current facility-administered medications on file prior to visit.      Allergies   Allergen Reactions   ? No Known Drug Allergy      No Known Drug Allergies       Health Risk Assessment  General Care Management - Patient Level       Exercise:  yes

## 2017-01-15 NOTE — Progress Notes
?   meclizine (ANTIVERT) 25 MG PO Tablet     Sig: Take 1 tablet by mouth 3 times daily as needed.     Dispense:  60 tablet     Refill:  2     Orders Placed This Encounter   Procedures   ? MAM Screening     No orders of the following type were placed in this encounter: Referrals      During the course of the visit the patient was educated and counseled about appropriate screening and preventive services as follows:  Health Maintenance Reminders       Due Completion Dates    Pap Smear 02/12/2016 02/11/2013 (Done), 04/01/2011    Colon Cancer Screening 03/28/2016 ---    Breast Cancer Screening 02/12/2017 (Originally 03/28/2016) ---    Lipid Profile 02/18/2017 02/19/2016, 0000000    Basic Metabolic Panel [id number], 02/19/2016, 02/19/2016    Preventive Wellness Visit 01/15/2018 01/15/2017 (Done)    Zoster Vaccine (1) 03/28/2026 ---          Health Maintenance Topics with due status: Completed       Topic Last Completion Date    USPSTF HIV Risk Assessment 02/11/2014     Health Maintenance Topics with due status: Excluded       Topic Date Due    DTaP,Tdap,and Td Vaccines Excluded    Pneumovax / Prevnar Excluded       Preventive Health Counseling    Age appropriate anticipatory guidelines discussed with the patient.    Nutrition: Stressed importance of moderation in sodium/caffeine intake, saturated fat and cholesterol, caloric balance, sufficient intake of fresh fruits, vegetables, fiber, calcium, and iron.  Exercise: Stressed the importance of regular exercise.   Substance Abuse: Discussed cessation/primary prevention of tobacco, alcohol, or other drug use; driving or other dangerous activities under the influence; availability of treatment for abuse.   Sexuality: Discussed sexually transmitted diseases, partner selection, and use of condoms.  Dental health: Discussed importance of regular tooth brushing, flossing, and dental visits.  End of Life:  Voluntary advance care planning was held with patient and/or

## 2017-01-15 NOTE — Progress Notes
-  VH at 01/15/17 0813       BP 138/78    -VH at 01/15/17 0813       BP Location Left upper arm    -VH at 01/15/17 0813       Position Sitting    -VH at 01/15/17 0813       Pulse 68    -VH at 01/15/17 0813       Pulse Source Radial    -VH at 01/15/17 0813       Pulse Quality Normal    -VH at 01/15/17 0813       Resp 16    -VH at 01/15/17 0813       Respiration Quality Normal    -VH at 01/15/17 0813       Education/Communication Barriers?    Learning/Communication Barriers? No    -VH at 01/15/17 0813       Fall Risk Assessment    Had recent fall / Last 6 months? No recent fall    -VH at 01/15/17 0813       Does patient have a fear of falling? No    -VH at 01/15/17 0813         User Key  (r) = Recorded By, (t) = Taken By, (c) = Cosigned By    [facility] Name Effective Dates    [place], [place], [place] 03/19/16 -           Assessment:       ICD-10-CM ICD-9-CM    1. Medicare annual wellness visit, subsequent Z00.00 V70.0    2. BMI 28.0-28.9,adult Z68.28 V85.24    3. Screening mammogram, encounter for Z12.31 V76.12 MAM Screening   4. Screening for malignant neoplasm of cervix Z12.4 V76.2 THINPREP TIS AND HPV mRNA E6/E7      THINPREP TIS AND HPV mRNA E6/E7   5. Hypovitaminosis D E55.9 268.9 ergocalciferol (vitamin D-2) 50000 units PO Capsule   6. OAB (overactive bladder) N32.81 596.51 solifenacin (VESICARE) 5 MG PO Tablet   7. Dizziness R42 780.4 meclizine (ANTIVERT) 25 MG PO Tablet     Medicare AWV completed.      Plan:     During the exam the following risks were identified and plans for these risks are discussed below:      Orders Placed This Encounter   Medications   ? ergocalciferol (vitamin D-2) 50000 units PO Capsule     Sig: TAKE 1 CAPSULE BY MOUTH 1 TIME A WEEK ON AN EMPTY STOMACH     Dispense:  13 capsule     Refill:  0     **Patient requests 90 days supply**   ? solifenacin (VESICARE) 5 MG PO Tablet     Sig: Take 1 tablet by mouth daily.     Dispense:  90 tablet     Refill:  3

## 2017-01-15 NOTE — Progress Notes
Subjective:   Nichole Colon is a 51 y.o. female being seen today for Subsequent Medicare Annual Wellness (AWV) (with pap)       HPI  Medicare wellness    Subjective:   Nichole Colon is a 51 y.o. female here for her Subsequent Medicare Annual Wellness (AWV) (with pap)      Patient Care Team:  Gretchen Short, MD as PCP - General (Family Medicine)     Past Medical History:   Diagnosis Date   ? Atrial fib/flutter, transient    ? Blood clot in vein    ? CAD (coronary artery disease)    ? COPD (chronic obstructive pulmonary disease)    ? Hyperlipemia    ? Hypertension    ? Lupus    ? Occasional tremors    ? Osteoarthritis    ? Pulmonary embolism    ? Sjogren's syndrome    ? TIA (transient ischemic attack)      Past Surgical History:   Procedure Laterality Date   ? CHOLECYSTECTOMY      C-Section   ? CHOLECYSTECTOMY      Surgery Description: Cholecystectomy;   (Created by Conversion)     Family History   Problem Relation Age of Onset   ? Asthma Mother    ? Heart Disease Mother    ? Hypertension Mother    ? Stroke Mother    ? Vision Loss Mother    ? Asthma Brother    ? Birth Defects Brother    ? Early Death Brother    ? Heart Disease Brother    ? High Cholesterol Brother    ? Hypertension Brother    ? Asthma Daughter    ? High Cholesterol Daughter    ? Hypertension Daughter    ? Asthma Maternal Aunt    ? Heart Disease Maternal Aunt    ? Hypertension Maternal Aunt    ? Miscarriages / Stillbirths Maternal Aunt    ? Stroke Maternal Aunt    ? Vision Loss Maternal Aunt    ? Heart Disease Maternal Uncle    ? Hypertension Maternal Uncle    ? Stroke Maternal Uncle    ? Arthritis Maternal Grandmother    ? Asthma Maternal Grandmother    ? Clotting Disorder Maternal Grandmother    ? Heart Disease Maternal Grandmother    ? Hypertension Maternal Grandmother    ? Miscarriages / Stillbirths Maternal Grandmother    ? Vision Loss Maternal Grandmother      Social History   Substance Use Topics   ? Smoking status: Never Smoker

## 2017-01-15 NOTE — Progress Notes
surrogates about creating/reviewing their advance directive. An advance directive is a document appointing an agent and/or recording the wishes of a patient pertaining to his/her medical treatment at a future tie should he/she lack decisional capacity at that time.    The patient verbalized understanding of the above assessments and treatment and agrees to follow through.         After visit summary provided with screening schedule, treatment options and education for established conditions or risk factors, and personized health advice.      Pap smear today    Colonoscopy 2014 done at [facility] orders            Past Medical History:   Diagnosis Date   ? Atrial fib/flutter, transient    ? Blood clot in vein    ? CAD (coronary artery disease)    ? COPD (chronic obstructive pulmonary disease)    ? Hyperlipemia    ? Hypertension    ? Lupus    ? Occasional tremors    ? Osteoarthritis    ? Pulmonary embolism    ? Sjogren's syndrome    ? TIA (transient ischemic attack)      Past Surgical History:   Procedure Laterality Date   ? CHOLECYSTECTOMY      C-Section   ? CHOLECYSTECTOMY      Surgery Description: Cholecystectomy;   (Created by Conversion)     Family History   Problem Relation Age of Onset   ? Asthma Mother    ? Heart Disease Mother    ? Hypertension Mother    ? Stroke Mother    ? Vision Loss Mother    ? Asthma Brother    ? Birth Defects Brother    ? Early Death Brother    ? Heart Disease Brother    ? High Cholesterol Brother    ? Hypertension Brother    ? Asthma Daughter    ? High Cholesterol Daughter    ? Hypertension Daughter    ? Asthma Maternal Aunt    ? Heart Disease Maternal Aunt    ? Hypertension Maternal Aunt    ? Miscarriages / Stillbirths Maternal Aunt    ? Stroke Maternal Aunt    ? Vision Loss Maternal Aunt    ? Heart Disease Maternal Uncle    ? Hypertension Maternal Uncle    ? Stroke Maternal Uncle    ? Arthritis Maternal Grandmother    ? Asthma Maternal Grandmother

## 2017-01-15 NOTE — Progress Notes
Medications Independent   Transport Independent   Ambulation status Independent   Cognitive status:  The patient  does not have cognitive impairment;does not have problems with memory or concentration;does not have dementia   Depression screening 3725F - Screening for Depression   Feeling down, depressed, or hopeless? No   Loss of interest or pleasure in doing things? No   Pain screening 1126F - Pain screening quantified.  No pain issues are present.   Has patient fallen or had problems with walking or balance in the past year? 1101F - No falls in past year or 1 fall with no injury   Smoking Status 1033F - Current non-smoker   Tobacco Cessation:  4004F - Tobacco screening and tobacco cessation intervention (counseling and/or pharmacotherapy)   Select if you assessed the patient for Urinary Incontinence 1090F - Urinary Incontinence Assessment       Objective:          Office Visit on 01/15/2017   Component Date Value   ? Colonoscopy Comment 06/14/2013 [facility] [place]    Office Visit on 12/24/2016   Component Date Value   ? Color -Ur 12/25/2016 DARK YELLOW    ? APPEARANCE 12/25/2016 CLEAR    ? Specific Gravity -Ur 12/25/2016 1.015    ? pH -Ur 12/25/2016 7.5    ? Glucose -Ur 12/25/2016 NEGATIVE    ? Bilirubin -Ur 12/25/2016 NEGATIVE    ? Ketones UA 12/25/2016 NEGATIVE    ? Hb -Ur 12/25/2016 NEGATIVE    ? Protein-UA 12/25/2016 NEGATIVE    ? NITRITE 12/25/2016 NEGATIVE    ? Leukocyte Esterase -Ur 12/25/2016 NEGATIVE    ? WBC -Ur 12/25/2016 NONE SEEN    ? RBC -Ur 12/25/2016 NONE SEEN    ? Squam Epithel, UA 12/25/2016 NONE SEEN    ? BACTERIA 12/25/2016 NONE SEEN    ? Hyaline Casts, UA 12/25/2016 NONE SEEN             VITAL SIGNS (all recorded)      Clinic Vitals       01/15/17 0812             Amb Encounter Vitals    Weight 83 kg (183 lb)    -VH at 01/15/17 0813       Height 1.702 m (5\' 7" )    -VH at 01/15/17 0813       BMI (Calculated) 28.72    -VH at 01/15/17 0813       BSA (Calculated - sq m) 1.98

## 2017-01-15 NOTE — Progress Notes
Education/Communication Barriers?    Learning/Communication Barriers? No    -VH at 01/15/17 0813       Fall Risk Assessment    Had recent fall / Last 6 months? No recent fall    -VH at 01/15/17 0813       Does patient have a fear of falling? No    -VH at 01/15/17 0813         User Key  (r) = Recorded By, (t) = Taken By, (c) = Cosigned By    Initials Name Effective Dates    [place], [place], [place] 03/19/16 -         Physical Exam   BP 138/78 - Pulse 68 - Resp 16 - Ht 1.702 m (5\' 7" ) - Wt 83 kg (183 lb) - BMI 28.66 kg/m2  General:  Alert, cooperative, no distress, appears stated age.   Head:  Normocephalic, without obvious abnormality, atraumatic.   Eyes:  Conjunctivae/corneas clear. PERRL, EOMs intact. Fundi benign.   Ears:  Normal TMs and external ear canals both ears.   Nose: Nares normal. Septum midline. Mucosa normal. No drainage or sinus tenderness.   Throat: Lips, mucosa, and tongue normal. Teeth and gums normal.   Neck: Supple, symmetrical, trachea midline, no adenopathy, thyroid: no enlargment/tenderness/nodules, no carotid bruit and no JVD.   Back:   Symmetric, no curvature. ROM normal. No CVA tenderness.   Lungs:   Clear to auscultation bilaterally.   Chest wall:  No tenderness or deformity.   Heart:  Regular rate and rhythm, S1, S2 normal, no murmur, click, rub or gallop.   Breast Exam:  No tenderness, masses, or nipple abnormality.   Abdomen:   Soft, non-tender. Bowel sounds normal. No masses,  No organomegaly.   Genitalia:  Normal female without lesion, discharge or tenderness.   Rectal:  Normal tone, normal prostate, no masses or tenderness  Guaiac negative stool.   Extremities: Extremities normal, atraumatic, no cyanosis or edema.   Pulses: 2+ and symmetric all extremities.   Skin: Skin color, texture, turgor normal. No rashes or lesions.   Lymph nodes: Cervical, supraclavicular, and axillary nodes normal.   Neurologic: CNII-XII intact. Normal strength, sensation and reflexes throughout.

## 2017-01-15 NOTE — Progress Notes
TABLET BY MOUTH DAILY   ? [DISCONTINUED] magnesium oxide (MAG-OX) 400 MG tablet Take 400 mg by mouth daily.   ? [DISCONTINUED] moxifloxacin (VIGAMOX) 0.5 % Solution Place 1 drop into both eyes 3 times daily.   ? [DISCONTINUED] prochlorperazine (COMPAZINE) 10 MG Tablet Take 1 Tablet by mouth every 8 hours as needed.     No current facility-administered medications on file prior to visit.      Allergies   Allergen Reactions   ? No Known Drug Allergy      No Known Drug Allergies     Under Rheum SLE    Review of Systems  Review of Systems    Constitutional: denies fever, chills, or night sweats  HENT: denies headaches or changes in hearing  Eyes: denies visual disturbance  Respiratory: denies cough, shortness of breath  Cardiovascular: denies chest pain or palpitations  GI: denies nausea, vomiting, diarrhea, constipation, anorexia, and weight flucuates  Neurological: denies numbness, weakness,   Endocrine: denies malaise, sleepiness, hot flashes  Hematologic/Lymphatic: denies fatigue, lumps or bumps anywhere, groin or armpit lumps, edema  GU: denies dysuria, hematuria, and increase or decrease in urination  Integumentary: denies rashes or other lesions  Psychiatric: denies changes in mood or affect      Objective:        VITAL SIGNS (all recorded)      Clinic Vitals       01/15/17 0812             Amb Encounter Vitals    Weight 83 kg (183 lb)    -VH at 01/15/17 0813       Height 1.702 m (5\' 7" )    -VH at 01/15/17 0813       BMI (Calculated) 28.72    -VH at 01/15/17 0813       BSA (Calculated - sq m) 1.98    -VH at 01/15/17 0813       BP 138/78    -VH at 01/15/17 0813       BP Location Left upper arm    -VH at 01/15/17 0813       Position Sitting    -VH at 01/15/17 0813       Pulse 68    -VH at 01/15/17 0813       Pulse Source Radial    -VH at 01/15/17 0813       Pulse Quality Normal    -VH at 01/15/17 0813       Resp 16    -VH at 01/15/17 0813       Respiration Quality Normal    -VH at 01/15/17 [id number]

## 2017-01-20 ENCOUNTER — Ambulatory Visit: Attending: Optometrist | Primary: Internal Medicine

## 2017-01-20 DIAGNOSIS — M3501 Sicca syndrome with keratoconjunctivitis: Principal | ICD-10-CM

## 2017-01-20 DIAGNOSIS — H04123 Dry eye syndrome of bilateral lacrimal glands: Secondary | ICD-10-CM

## 2017-01-20 DIAGNOSIS — Z79899 Other long term (current) drug therapy: Secondary | ICD-10-CM

## 2017-01-21 ENCOUNTER — Ambulatory Visit: Admit: 2017-01-21 | Discharge: 2017-01-21 | Attending: Acute Care | Primary: Internal Medicine

## 2017-01-21 DIAGNOSIS — J449 Chronic obstructive pulmonary disease, unspecified: Secondary | ICD-10-CM

## 2017-01-21 DIAGNOSIS — I10 Essential (primary) hypertension: Secondary | ICD-10-CM

## 2017-01-21 DIAGNOSIS — Z79899 Other long term (current) drug therapy: Secondary | ICD-10-CM

## 2017-01-21 DIAGNOSIS — G43711 Chronic migraine without aura, intractable, with status migrainosus: Secondary | ICD-10-CM

## 2017-01-21 DIAGNOSIS — IMO0002 Atrial fib/flutter, transient: Secondary | ICD-10-CM

## 2017-01-21 DIAGNOSIS — M3500 Sicca syndrome, unspecified: Secondary | ICD-10-CM

## 2017-01-21 DIAGNOSIS — G4730 Sleep apnea, unspecified: Secondary | ICD-10-CM

## 2017-01-21 DIAGNOSIS — E785 Hyperlipidemia, unspecified: Secondary | ICD-10-CM

## 2017-01-21 DIAGNOSIS — I2510 Atherosclerotic heart disease of native coronary artery without angina pectoris: Secondary | ICD-10-CM

## 2017-01-21 DIAGNOSIS — R51 Headache: Secondary | ICD-10-CM

## 2017-01-21 DIAGNOSIS — G459 Transient cerebral ischemic attack, unspecified: Secondary | ICD-10-CM

## 2017-01-21 DIAGNOSIS — M1990 Unspecified osteoarthritis, unspecified site: Secondary | ICD-10-CM

## 2017-01-21 DIAGNOSIS — R251 Tremor, unspecified: Secondary | ICD-10-CM

## 2017-01-21 DIAGNOSIS — I2699 Other pulmonary embolism without acute cor pulmonale: Secondary | ICD-10-CM

## 2017-01-21 DIAGNOSIS — I8290 Acute embolism and thrombosis of unspecified vein: Secondary | ICD-10-CM

## 2017-01-21 DIAGNOSIS — M329 Systemic lupus erythematosus, unspecified: Secondary | ICD-10-CM

## 2017-01-21 DIAGNOSIS — G43009 Migraine without aura, not intractable, without status migrainosus: Principal | ICD-10-CM

## 2017-01-21 MED ORDER — ZONISAMIDE 100 MG PO CAPS
0 refills | Status: CP
Start: 2017-01-21 — End: 2017-02-10

## 2017-01-21 MED ORDER — PREDNISONE 20 MG PO TABS
1 refills | Status: CP
Start: 2017-01-21 — End: 2017-02-10

## 2017-01-21 MED ORDER — BUTALBITAL-APAP-CAFFEINE 50-325-40 MG PO TABS
ORAL_TABLET | 2 refills | Status: SS
Start: 2017-01-21 — End: 2018-08-15

## 2017-01-21 NOTE — Patient Instructions
Start Prednisone taper

## 2017-01-21 NOTE — Patient Instructions
Start Prednisone taper    Wean off TOPAMAX-- take 1 tab nightly for 5-7 days then 1/2 tab nightly for 5 days then STOP     Start Zonisamide 100mg  caps nightly for 1 week then 2 caps nightly for headache prevention    For severe migraine=== take FIORICET. Do not use more then 2 times per week

## 2017-01-23 DIAGNOSIS — M329 Systemic lupus erythematosus, unspecified: Principal | ICD-10-CM

## 2017-01-24 NOTE — Progress Notes
No teaching statement needed

## 2017-01-25 ENCOUNTER — Inpatient Hospital Stay

## 2017-01-25 ENCOUNTER — Observation Stay: Admit: 2017-01-25 | Discharge: 2017-01-28 | Primary: Internal Medicine

## 2017-01-25 ENCOUNTER — Emergency Department: Admit: 2017-01-25 | Discharge: 2017-01-28

## 2017-01-25 DIAGNOSIS — Z86711 Personal history of pulmonary embolism: Secondary | ICD-10-CM

## 2017-01-25 DIAGNOSIS — R110 Nausea: Secondary | ICD-10-CM

## 2017-01-25 DIAGNOSIS — Z8673 Personal history of transient ischemic attack (TIA), and cerebral infarction without residual deficits: Secondary | ICD-10-CM

## 2017-01-25 DIAGNOSIS — G459 Transient cerebral ischemic attack, unspecified: Secondary | ICD-10-CM

## 2017-01-25 DIAGNOSIS — Z79899 Other long term (current) drug therapy: Secondary | ICD-10-CM

## 2017-01-25 DIAGNOSIS — R6884 Jaw pain: Secondary | ICD-10-CM

## 2017-01-25 DIAGNOSIS — I739 Peripheral vascular disease, unspecified: Secondary | ICD-10-CM

## 2017-01-25 DIAGNOSIS — Z8249 Family history of ischemic heart disease and other diseases of the circulatory system: Secondary | ICD-10-CM

## 2017-01-25 DIAGNOSIS — I10 Essential (primary) hypertension: Principal | ICD-10-CM

## 2017-01-25 DIAGNOSIS — G629 Polyneuropathy, unspecified: Secondary | ICD-10-CM

## 2017-01-25 DIAGNOSIS — R61 Generalized hyperhidrosis: Secondary | ICD-10-CM

## 2017-01-25 DIAGNOSIS — R0789 Other chest pain: Principal | ICD-10-CM

## 2017-01-25 DIAGNOSIS — M3500 Sicca syndrome, unspecified: Secondary | ICD-10-CM

## 2017-01-25 DIAGNOSIS — R9431 Abnormal electrocardiogram [ECG] [EKG]: Secondary | ICD-10-CM

## 2017-01-25 DIAGNOSIS — I2510 Atherosclerotic heart disease of native coronary artery without angina pectoris: Secondary | ICD-10-CM

## 2017-01-25 DIAGNOSIS — R51 Headache: Secondary | ICD-10-CM

## 2017-01-25 DIAGNOSIS — R002 Palpitations: Secondary | ICD-10-CM

## 2017-01-25 DIAGNOSIS — Z7952 Long term (current) use of systemic steroids: Secondary | ICD-10-CM

## 2017-01-25 DIAGNOSIS — I6350 Cerebral infarction due to unspecified occlusion or stenosis of unspecified cerebral artery: Secondary | ICD-10-CM

## 2017-01-25 DIAGNOSIS — R0602 Shortness of breath: Secondary | ICD-10-CM

## 2017-01-25 DIAGNOSIS — R251 Tremor, unspecified: Secondary | ICD-10-CM

## 2017-01-25 DIAGNOSIS — I4891 Unspecified atrial fibrillation: Secondary | ICD-10-CM

## 2017-01-25 DIAGNOSIS — Z9119 Patient's noncompliance with other medical treatment and regimen: Secondary | ICD-10-CM

## 2017-01-25 DIAGNOSIS — R079 Chest pain, unspecified: Secondary | ICD-10-CM

## 2017-01-25 DIAGNOSIS — M3501 Sicca syndrome with keratoconjunctivitis: Secondary | ICD-10-CM

## 2017-01-25 DIAGNOSIS — I8290 Acute embolism and thrombosis of unspecified vein: Secondary | ICD-10-CM

## 2017-01-25 DIAGNOSIS — M329 Systemic lupus erythematosus, unspecified: Secondary | ICD-10-CM

## 2017-01-25 DIAGNOSIS — M1990 Unspecified osteoarthritis, unspecified site: Secondary | ICD-10-CM

## 2017-01-25 DIAGNOSIS — I2699 Other pulmonary embolism without acute cor pulmonale: Secondary | ICD-10-CM

## 2017-01-25 DIAGNOSIS — J449 Chronic obstructive pulmonary disease, unspecified: Secondary | ICD-10-CM

## 2017-01-25 DIAGNOSIS — I209 Angina pectoris, unspecified: Secondary | ICD-10-CM

## 2017-01-25 DIAGNOSIS — IMO0002 Atrial fib/flutter, transient: Secondary | ICD-10-CM

## 2017-01-25 DIAGNOSIS — R072 Precordial pain: Secondary | ICD-10-CM

## 2017-01-25 DIAGNOSIS — E785 Hyperlipidemia, unspecified: Secondary | ICD-10-CM

## 2017-01-25 MED ORDER — PILOCARPINE HCL 5 MG PO TABS
5 mg | Freq: Two times a day (BID) | ORAL | Status: DC
Start: 2017-01-25 — End: 2017-01-29

## 2017-01-25 MED ORDER — ZONISAMIDE 100 MG PO CAPS
100 mg | Freq: Every evening | ORAL | Status: DC
Start: 2017-01-25 — End: 2017-01-25

## 2017-01-25 MED ORDER — PREDNISONE 20 MG PO TABS
60 mg | Freq: Every day | ORAL | Status: CP
Start: 2017-01-25 — End: ?

## 2017-01-25 MED ORDER — ENOXAPARIN SODIUM 40 MG/0.4ML SC SOLN
40 mg | Freq: Every day | SUBCUTANEOUS | Status: DC
Start: 2017-01-25 — End: 2017-01-29

## 2017-01-25 MED ORDER — GLUCOSE 4 G PO CHEW JX
16 g | ORAL | Status: DC | PRN
Start: 2017-01-25 — End: 2017-01-29

## 2017-01-25 MED ORDER — ASPIRIN 325 MG PO TABS
325 mg | Freq: Once | ORAL | Status: CP
Start: 2017-01-25 — End: ?

## 2017-01-25 MED ORDER — DILTIAZEM HCL ER 180 MG PO CP24
180 mg | Freq: Every day | ORAL | Status: DC
Start: 2017-01-25 — End: 2017-01-29

## 2017-01-25 MED ORDER — TOPIRAMATE 50 MG PO TABS
Freq: Two times a day (BID) | ORAL
Start: 2017-01-25 — End: 2017-02-10

## 2017-01-25 MED ORDER — SERTRALINE HCL 50 MG PO TABS
50 mg | Freq: Every day | ORAL | Status: DC
Start: 2017-01-25 — End: 2017-01-29

## 2017-01-25 MED ORDER — HYDRALAZINE HCL 20 MG/ML IJ SOLN
10 mg | Freq: Four times a day (QID) | INTRAVENOUS | Status: DC | PRN
Start: 2017-01-25 — End: 2017-01-29

## 2017-01-25 MED ORDER — ONDANSETRON 4 MG PO TBDP
4 mg | Freq: Four times a day (QID) | ORAL | Status: DC | PRN
Start: 2017-01-25 — End: 2017-01-29

## 2017-01-25 MED ORDER — FENTANYL CITRATE INJ 50 MCG/ML CUSTOM AMP/VIAL
50 ug | Freq: Once | INTRAVENOUS | Status: CP
Start: 2017-01-25 — End: ?

## 2017-01-25 MED ORDER — CYCLOSPORINE 0.05 % OP EMUL
1 [drp] | Freq: Two times a day (BID) | OPHTHALMIC | Status: DC
Start: 2017-01-25 — End: 2017-01-29

## 2017-01-25 MED ORDER — SENNOSIDES-DOCUSATE SODIUM 8.6-50 MG PO TABS
1 | ORAL_TABLET | Freq: Two times a day (BID) | ORAL | Status: DC | PRN
Start: 2017-01-25 — End: 2017-01-29

## 2017-01-25 MED ORDER — ONDANSETRON HCL 4 MG/2ML IJ SOLN
4 mg | Freq: Four times a day (QID) | INTRAVENOUS | Status: DC | PRN
Start: 2017-01-25 — End: 2017-01-29

## 2017-01-25 MED ORDER — SOLIFENACIN SUCCINATE 5 MG PO TABS
5 mg | Freq: Every day | ORAL | Status: DC
Start: 2017-01-25 — End: 2017-01-29

## 2017-01-25 MED ORDER — DEXTROSE 50 % IV SOLN
30 mL | INTRAVENOUS | Status: DC | PRN
Start: 2017-01-25 — End: 2017-01-29

## 2017-01-25 MED ORDER — ALUM & MAG HYDROX-SIMETH 1200-1200-120 MG/30ML PO SUSP UD
30 mL | Freq: Four times a day (QID) | ORAL | Status: DC | PRN
Start: 2017-01-25 — End: 2017-01-29

## 2017-01-25 MED ORDER — GABAPENTIN 300 MG PO CAPS
600 mg | Freq: Three times a day (TID) | ORAL | Status: DC
Start: 2017-01-25 — End: 2017-01-29

## 2017-01-25 MED ORDER — PERFLUTREN LIPID MICROSPHERE 6.52 MG/ML IV SUSP
.2-1.3 mL | INTRAVENOUS | Status: DC | PRN
Start: 2017-01-25 — End: 2017-01-29

## 2017-01-25 MED ORDER — ASPIRIN 81 MG PO CHEW
81 mg | Freq: Every day | ORAL | Status: DC
Start: 2017-01-25 — End: 2017-01-29

## 2017-01-25 MED ORDER — ACETAMINOPHEN 325 MG PO TABS
650 mg | ORAL | Status: DC | PRN
Start: 2017-01-25 — End: 2017-01-29

## 2017-01-25 MED ORDER — NITROGLYCERIN 0.4 MG SL SUBL
.4 mg | Freq: Once | SUBLINGUAL | Status: CP
Start: 2017-01-25 — End: ?

## 2017-01-25 MED ORDER — ONDANSETRON HCL 4 MG/2ML IJ SOLN
4 mg | Freq: Once | INTRAVENOUS | Status: CP
Start: 2017-01-25 — End: ?

## 2017-01-25 MED ORDER — TOPIRAMATE 25 MG PO TABS
50 mg | Freq: Every evening | ORAL | Status: DC
Start: 2017-01-25 — End: 2017-01-29

## 2017-01-25 MED ORDER — HYDROCHLOROTHIAZIDE 25 MG PO TABS
25 mg | Freq: Every day | ORAL | Status: DC
Start: 2017-01-25 — End: 2017-01-29

## 2017-01-25 MED ORDER — NITROGLYCERIN 0.4 MG SL SUBL
.4 mg | SUBLINGUAL | Status: DC | PRN
Start: 2017-01-25 — End: 2017-01-29

## 2017-01-25 NOTE — H&P
?   Occasional tremors    ? Osteoarthritis    ? Personal history of noncompliance with medical treatment, presenting hazards to health    ? Pulmonary embolism    ? Sjogren's syndrome    ? TIA (transient ischemic attack)      Allergies   Allergen Reactions   ? No Known Drug Allergy      No Known Drug Allergies     Social History     Social History   ? Marital status: Single     Spouse name: N/A   ? Number of children: N/A   ? Years of education: N/A     Occupational History   ? Not on file.     Social History Main Topics   ? Smoking status: Never Smoker   ? Smokeless tobacco: Never Used   ? Alcohol use No   ? Drug use: No   ? Sexual activity: Not Currently     Other Topics Concern   ? Not on file     Social History Narrative     Family History   Problem Relation Age of Onset   ? Asthma Mother    ? Heart Disease Mother    ? Hypertension Mother    ? Stroke Mother    ? Vision Loss Mother    ? Asthma Brother    ? Birth Defects Brother    ? Early Death Brother    ? Heart Disease Brother    ? High Cholesterol Brother    ? Hypertension Brother    ? Asthma Daughter    ? High Cholesterol Daughter    ? Hypertension Daughter    ? Asthma Maternal Aunt    ? Heart Disease Maternal Aunt    ? Hypertension Maternal Aunt    ? Miscarriages / Stillbirths Maternal Aunt    ? Stroke Maternal Aunt    ? Vision Loss Maternal Aunt    ? Heart Disease Maternal Uncle    ? Hypertension Maternal Uncle    ? Stroke Maternal Uncle    ? Arthritis Maternal Grandmother    ? Asthma Maternal Grandmother    ? Clotting Disorder Maternal Grandmother    ? Heart Disease Maternal Grandmother    ? Hypertension Maternal Grandmother    ? Miscarriages / Stillbirths Maternal Grandmother    ? Vision Loss Maternal Grandmother                    Review of Systems:  All the 10  systems were reviewed and they are negative except for those mentioned in theHPI    Physical Exam:   VITALS: BP 127/84 - Pulse 59 - Temp 36.6 ?C (97.9 ?F) (Oral)  - Resp 20 -

## 2017-01-25 NOTE — ED Notes
Stress test called to notify caring nurse that test was not completed due to pt stating previous test completed in past. Paged admitting physician. Will continue to monitor.

## 2017-01-25 NOTE — ED Provider Notes
All other systems reviewed and are negative.      Physical Exam     ED Triage Vitals   BP 01/25/17 0125 132/86   Pulse 01/25/17 0125 63   Resp 01/25/17 0125 20   Temp 01/25/17 0125 36.6 ?C (97.9 ?F)   Temp src 01/25/17 0125 Oral   Height 01/25/17 0125 1.702 m   Weight 01/25/17 0125 81.6 kg   SpO2 01/25/17 0125 100 %   BMI (Calculated) 01/25/17 0125 28.25             Physical Exam   Constitutional: She is oriented to person, place, and time. She appears well-developed and well-nourished. No distress.   HENT:   Head: Normocephalic and atraumatic.   Right Ear: External ear normal.   Left Ear: External ear normal.   Nose: Nose normal.   Mouth/Throat: Oropharynx is clear and moist.   Eyes: Conjunctivae and EOM are normal. Pupils are equal, round, and reactive to light.   Neck: Normal range of motion. Neck supple.   Cardiovascular: Normal rate, regular rhythm, normal heart sounds and intact distal pulses.    No murmur heard.  Pulmonary/Chest: Effort normal and breath sounds normal. No respiratory distress. She has no wheezes.   Abdominal: Soft. Bowel sounds are normal. She exhibits no distension. There is no tenderness.   Musculoskeletal: Normal range of motion.   Neurological: She is alert and oriented to person, place, and time. No cranial nerve deficit.   Skin: Skin is warm and dry.   Nursing note and vitals reviewed.      Differential DDx: CP, ACS, AMI, musculoskeletal pain, pneumothorax, PE, and other.    Is this an Emergent Medical Condition? Yes - Severe Pain/Acute Onset of Symptons  409.901 FS  641.19 FS  627.732 (16) FS    ED Workup   Procedures    Labs:  -   POCT URINALYSIS AUTO W/O MICROSCOPY - Abnormal        Result Value Ref Range    Color -Ur Yellow      Clarity, UA Clear      Spec Grav 1.010  1.003 - 1.030    pH 7.0 (*) 7.4 - 7.4    Urobilinogen -Ur 0.2  <=2.0 E.U./dL    Nitrite -Ur Negative  Negative    Protein-UA Negative  Negative mg/dL    Glucose -Ur Negative  Negative mg/dL

## 2017-01-25 NOTE — H&P
treadmill stress test . Pt says that she had Treadmill stress test 7 months ago here at [facility] ? , last stress test at [facility] 2012 per Epic records     ? Headache / Migraine d/o seen by Neuro , No Triptan due to coronary spasm h/o , Now on Topamax taper , Prednisone taper for HA  And start on Zonegran once off Topamax . See Neuro Note 01/21/2017 for further details . If any acute HA in [place] , order Fioricet as recommended by Neuro .  Dont treat no more than 2 HA per week with Fioricet   ? SLE / Sjogrens , follows with Rheum as OP , cont Pilocarpine , Plaquenil stopped due to no follow up [place] [facility] since 6/16   ? HTN cont Home medications   ? 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 Neuropathy cont Gabapentin     Dispo Pending above       01/25/2017, 3:57 AM  Yehuda Mao, MD

## 2017-01-25 NOTE — Progress Notes
Absolute NRBC Count 0.00     Neutrophils % 67.0 34.0 - 73.0 %    Lymphocytes % 28.7 25.0 - 45.0 %    Monocytes % 3.7 2.0 - 6.0 %    Eosinophils % 0.0 (L) 1.0 - 4.0 %    Immature Granulocytes % 0.4 0.0 - 2.0 %    Neutrophils Absolute 3.10 1.80 - 8.70 x10E3/uL    Lymphocytes Absolute 1.33 x10E3/uL    Monocytes Absolute 0.17 x10E3/uL    Eosinophils Absolute 0.00 x10E3/uL    Basophil Absolute 0.01 x10E3/uL    Basophils % 0.2 0 - 1 %   POCT TROPININ I    Collection Time: 01/25/17  2:58 AM   Result Value Ref Range    Troponin I (Point of Care) <0.05 0.00 - 0.23 ng/mL   Urinalysis W/Microscopy    Collection Time: 01/25/17  3:38 AM   Result Value Ref Range    Color -Ur Equities trader, UA Clear Hazy    Specific Gravity, Urine 1.005 1.003 - 1.030    pH, Urine 8.0 4.5 - 8.0    Protein-UA Negative Negative mg/dL    Glucose -Ur Negative Negative mg/dL    Ketones UA Negative Negative mg/dL    Bilirubin -Ur Negative Negative    Blood -Ur Negative Negative    Nitrite -Ur Negative Negative    Urobilinogen -Ur Normal Normal    Leukocytes -Ur Negative Negative    RBC -Ur 3 0 - 5 /HPF    WBC -Ur <1 0 - 5 /HPF    Bacteria -Ur None seen None seen /HPF    Mucus -Ur Rare 2+ /LPF    ASCORBIC ACID Negative 20 mg/dL   POCT Urinalysis w/o Microscopy auto    Collection Time: 01/25/17  3:44 AM   Result Value Ref Range    Color -Ur Yellow     Clarity, UA Clear     Spec Grav 1.015 1.003 - 1.030    pH 8.0 (H) 7.4 - 7.4    Urobilinogen -Ur 0.2 <=2.0 E.U./dL    Nitrite -Ur Negative Negative    Protein-UA Negative Negative mg/dL    Glucose -Ur Negative Negative mg/dL    Blood, UA Negative Negative    WBC, UA Trace (A) Negative    Bilirubin -Ur Negative Negative    Ketones -UR Negative Negative   Troponin T    Collection Time: 01/25/17  6:44 AM   Result Value Ref Range    Troponin T <0.01 0.00 - 0.04 ng/ml         Radiology:  Per Radiology: Xr Chest Single View    Result Date: 01/25/2017

## 2017-01-25 NOTE — Consults
H and P complete

## 2017-01-25 NOTE — Plan of Care
Problem: Pain, Acute & Chronic  Goal: Pain is relieved/acceptable level with minimal side effects    Intervention: Assess pain level  Pt medicated for leg pain as per MAR. Will continue to monitor.

## 2017-01-25 NOTE — ED Provider Notes
GABAPENTIN (NEURONTIN) 600 MG TABLET    TAKE 1 TABLET BY MOUTH THREE TIMES DAILY    HYDROCHLOROTHIAZIDE (HYDRODIURIL) 25 MG PO TABLET    TAKE 1 TABLET BY MOUTH DAILY    HYDROXYCHLOROQUINE (PLAQUENIL) 200 MG TABLET    Take 1 tablet by mouth 2 times daily.    MECLIZINE (ANTIVERT) 25 MG PO TABLET    Take 1 tablet by mouth 3 times daily as needed.    PILOCARPINE (SALAGEN) 5 MG TABLET    Take 1 tablet by mouth 2 times daily.    PREDNISOLONE ACETATE (PRED FORTE) 1 % OP SUSPENSION    Place 1 drop into both eyes 4 times daily for 14 days.    PREDNISONE (DELTASONE) 20 MG PO TABLET    Take 3 tabs daily x 4 days then take 2 tabs daily x 3 days then take 1 tab daily x 2 days and STOP    SERTRALINE (ZOLOFT) 50 MG PO TABLET    TAKE 1 TABLET BY MOUTH EVERY DAY    SOLIFENACIN (VESICARE) 5 MG PO TABLET    Take 1 tablet by mouth daily.    TOPIRAMATE (TOPAMAX) 50 MG PO TABLET    Take by mouth 2 times daily.    ZONISAMIDE (ZONEGRAN) 100 MG PO CAPSULE    Take 1 caps nightly for 1 week then take 2 caps nightly for headache prevention   Modified Medications    No medications on file   Discontinued Medications    No medications on file       Past Medical History:   Diagnosis Date   ? Atrial fib/flutter, transient    ? Blood clot in vein    ? CAD (coronary artery disease)    ? Cerebral artery occlusion with cerebral infarction    ? COPD (chronic obstructive pulmonary disease)    ? Hyperlipemia    ? Hypertension    ? Lupus    ? Occasional tremors    ? Osteoarthritis    ? Personal history of noncompliance with medical treatment, presenting hazards to health    ? Pulmonary embolism    ? Sjogren's syndrome    ? TIA (transient ischemic attack)        Past Surgical History:   Procedure Laterality Date   ? CHOLECYSTECTOMY      C-Section   ? CHOLECYSTECTOMY      Surgery Description: Cholecystectomy;   (Created by Conversion)       Family History   Problem Relation Age of Onset   ? Asthma Mother    ? Heart Disease Mother    ? Hypertension Mother

## 2017-01-25 NOTE — ED Notes
Called heads up to floor. Room is not clean yet. Advised to call back in 15 mins.

## 2017-01-25 NOTE — ED Provider Notes
Calcium 10.0  8.6 - 10.0 mg/dL    Osmolality Calc 284.7      Anion Gap 17 (*) 4 - 16 mmol/L    EGFR >59  mL/min/1.73M2    Comment:   Reference range: =>90 ml/min/1.73M2  eGFR estimates are unable to accurately differentiate levels of GFR above 60 ml/min/1.73M2.   CBC AUTODIFF - Abnormal     WBC 4.63  4.5 - 11 x10E3/uL    RBC 4.80  4.20 - 5.40 x10E6/uL    Hemoglobin 13.0  12.0 - 16.0 g/dL    Hematocrit 41.0  37.0 - 47.0 %    MCV 85.4  82.0 - 101.0 fl    MCH 27.1  27.0 - 34.0 pg    MCHC 31.7  31.0 - 36.0 g/dL    RDW 13.3  12.0 - 16.1 %    Platelet Count 231  140 - 440 thou/cu mm    MPV 9.8  9.5 - 11.5 fl    nRBC % 0.0  0.0 - 1.0 %    Absolute NRBC Count 0.00      Neutrophils % 67.0  34.0 - 73.0 %    Lymphocytes % 28.7  25.0 - 45.0 %    Monocytes % 3.7  2.0 - 6.0 %    Eosinophils % 0.0 (*) 1.0 - 4.0 %    Immature Granulocytes % 0.4  0.0 - 2.0 %    Neutrophils Absolute 3.10  1.80 - 8.70 x10E3/uL    Lymphocytes Absolute 1.33  x10E3/uL    Monocytes Absolute 0.17  x10E3/uL    Eosinophils Absolute 0.00  x10E3/uL    Basophil Absolute 0.01  x10E3/uL    Basophils % 0.2  0 - 1 %   POCT URINALYSIS AUTO W/O MICROSCOPY - Abnormal     Color -Ur Yellow      Clarity, UA Clear      Spec Grav 1.010  1.003 - 1.030    pH 7.0 (*) 7.4 - 7.4    Urobilinogen -Ur 0.2  <=2.0 E.U./dL    Nitrite -Ur Negative  Negative    Protein-UA Negative  Negative mg/dL    Glucose -Ur Negative  Negative mg/dL    Blood, UA Trace (*) Negative    WBC, UA Negative  Negative    Bilirubin -Ur Negative  Negative    Ketones -UR Negative  Negative   POCT URINALYSIS AUTO W/O MICROSCOPY - Abnormal     Color -Ur Yellow      Clarity, UA Clear      Spec Grav 1.015  1.003 - 1.030    pH 8.0 (*) 7.4 - 7.4    Urobilinogen -Ur 0.2  <=2.0 E.U./dL    Nitrite -Ur Negative  Negative    Protein-UA Negative  Negative mg/dL    Glucose -Ur Negative  Negative mg/dL    Blood, UA Negative  Negative    WBC, UA Trace (*) Negative    Bilirubin -Ur Negative  Negative

## 2017-01-25 NOTE — Progress Notes
Spoke with patient about stress test.  Patient stated she had a nuclear stress test a few months ago at another facility, maybe [facility].  Patients stated they gave her medcation on the treadmill to increase her heart rate because she was no longer able to treadmill.  Patient stated she would not be able to make her target heart rate due to her Lupus and painful joints at this time.  Patient would like to get more information from her daughters before we proceed with the test regarding her last stress test.  Spoke with primary nurse, Lockie Mola regarding patients request.

## 2017-01-25 NOTE — ED Provider Notes
All other systems reviewed and are negative.      Physical Exam       ED Triage Vitals   BP 01/25/17 0125 132/86   Pulse 01/25/17 0125 63   Resp 01/25/17 0125 20   Temp 01/25/17 0125 36.6 ?C (97.9 ?F)   Temp src 01/25/17 0125 Oral   Height 01/25/17 0125 1.702 m   Weight 01/25/17 0125 81.6 kg   SpO2 01/25/17 0125 100 %   BMI (Calculated) 01/25/17 0125 28.25             Physical Exam   Constitutional: She is oriented to person, place, and time. She appears well-developed and well-nourished. No distress.   HENT:   Head: Normocephalic and atraumatic.   Right Ear: External ear normal.   Left Ear: External ear normal.   Nose: Nose normal.   Mouth/Throat: Oropharynx is clear and moist.   Eyes: Conjunctivae and EOM are normal. Pupils are equal, round, and reactive to light.   Neck: Normal range of motion. Neck supple.   Cardiovascular: Normal rate, regular rhythm, normal heart sounds and intact distal pulses.    No murmur heard.  Pulmonary/Chest: Effort normal and breath sounds normal. No respiratory distress. She has no wheezes.   Abdominal: Soft. Bowel sounds are normal. She exhibits no distension. There is no tenderness.   Musculoskeletal: Normal range of motion.   Neurological: She is alert and oriented to person, place, and time. No cranial nerve deficit.   Skin: Skin is warm and dry.   Nursing note and vitals reviewed.      Differential DDx: CP, ACS, AMI, musculoskeletal pain, pneumothorax, PE, and other.    Is this an Emergent Medical Condition? Yes - Severe Pain/Acute Onset of Symptons  409.901 FS  641.19 FS  627.732 (16) FS    ED Workup   Procedures    Labs:  -   BASIC METABOLIC PANEL - Abnormal        Result Value Ref Range    Sodium 142  135 - 145 mmol/L    Potassium 3.5  3.3 - 4.6 mmol/L    Chloride 103  101 - 110 mmol/L    CO2 22  21 - 29 mmol/L    Urea Nitrogen 13  6 - 22 mg/dL    Creatinine 0.71  0.51 - 0.95 mg/dL    BUN/Creatinine Ratio 18.3  6.0 - 22.0 (calc)    Glucose 124 (*) 71 - 99 mg/dL

## 2017-01-25 NOTE — Progress Notes
Department of Medicine  Hospitalist Service    Date of Admission: 01/25/2017   LOS: 0 days     Reason for admit: chest pain    Subjective:     Patient was seen and examined at bedside today.  Pt  is not in acute Respiratory distress currently.  Pt  is calm . Denied any complaints today.    Review of Systems:  All organ systems reviewed and Review of Systems are negative except as noted above.    I have reviewed the past medical, surgical, family and social history.      Objective:     Vital Signs: Last Filed Vitals Signs: 24 Hour Range   BP: 117/71 (02/10 0721) BP: (117-148)/(71-90)    Temp: 36.6 ?C (97.9 ?F) (02/10 0125) Temp:  [36.6 ?C (97.9 ?F)]    Pulse: 63 (02/10 0721) Pulse:  [51-72]    Resp: 20 (02/10 0721) Resp:  [13-22]    SpO2: 100 % (02/10 0721) SpO2:  [98 %-100 %]    Respiratory Device: Room Air / (None)        Pain Rating ([facility] Only) : 7        No intake or output data in the 24 hours ending 01/25/17 1017    SCHEDULED MEDS:  ? [START ON 01/26/2017] aspirin  81 mg Oral daily   ? cycloSPORINE  1 drop Both Eyes Q12H   ? dilTIAZem  180 mg Oral daily   ? enoxaparin  40 mg Subcutaneous daily   ? gabapentin  600 mg Oral TID   ? hydroCHLOROthiazide  25 mg Oral daily   ? pilocarpine  5 mg Oral BID   ? predniSONE  60 mg Oral daily   ? sertraline  50 mg Oral daily   ? solifenacin  5 mg Oral daily   ? topiramate  50 mg Oral Nightly     IV INFUSIONS:    PRN MEDS:    acetaminophen 650 mg Oral Q4H PRN   aluminum-magnesium-simethicone hydroxide 30 mL Oral Q6H PRN   dextrose 30 mL Intravenous PRN   glucose 16 g Oral PRN   hydrALAZINE 10 mg Intravenous Q6H PRN   nitroGLYCERIN 0.4 mg Sublingual Q5 Min PRN   ondansetron 4 mg Oral Q6H PRN   Or      ondansetron 4 mg Intravenous Q6H PRN   perflutren lipid microspheres 0.2-1.3 mL Intravenous PRN   senna-docusate 1 tablet Oral BID PRN       Physical Exam:  General: alert, cooperative, no acute distress  Eyes: EOMI, PERRL, anicteric sclera

## 2017-01-25 NOTE — ED Notes
Report to Shavonne, RN.

## 2017-01-25 NOTE — H&P
Glucose 124 (H)    Calcium 10.0    Osmolality Calc 284.7    Anion Gap 17 (H)    EGFR >59   CBC with Differential panel result    Collection Time: 01/25/17  2:31 AM   Result Value    WBC 4.63    RBC 4.80    Hemoglobin 13.0    Hematocrit 41.0    MCV 85.4    MCH 27.1    MCHC 31.7    RDW 13.3    Platelet Count 231    MPV 9.8    nRBC % 0.0    Absolute NRBC Count 0.00    Neutrophils % 67.0    Lymphocytes % 28.7    Monocytes % 3.7    Eosinophils % 0.0 (L)    Immature Granulocytes % 0.4    Neutrophils Absolute 3.10    Lymphocytes Absolute 1.33    Monocytes Absolute 0.17    Eosinophils Absolute 0.00    Basophil Absolute 0.01    Basophils % 0.2   POCT TROPININ I    Collection Time: 01/25/17  2:58 AM   Result Value    Troponin I (Point of Care) <0.05   POCT Urinalysis w/o Microscopy auto    Collection Time: 01/25/17  3:44 AM   Result Value    Color -Ur Yellow    Clarity, UA Clear    Spec Grav 1.015    pH 8.0 (H)    Urobilinogen -Ur 0.2    Nitrite -Ur Negative    Protein-UA Negative    Glucose -Ur Negative    Blood, UA Negative    WBC, UA Trace (A)    Bilirubin -Ur Negative    Ketones -UR Negative       Per Radiology: Xr Chest Single View    Result Date: 01/25/2017  PROCEDURE: XR CHEST SINGLE VIEW Comparison: Chest x-ray from June 09, 2011 History: Chest pain Findings: The trachea is midline. Cardiac silhouette is within normal limits. There is no pleural effusion. There is no focal consolidation. There is no pneumothorax. There is no acute osseous abnormality.     Impression: No radiographic evidence of acute cardiopulmonary disease. No significant change compared to the prior exam.       Current Outpatient Medications    Medication Sig Start Date End Date Taking? Authorizing Provider   butalbital-acetaminophen-caffeine (FIORICET, ESGIC) 50-325-40 MG PO Tablet Take 1-2 tabs at headache onset. May repeat 1 tab in 6 hours if needed. Do not treat more then 2 headaches per week. 01/21/17  Yes [name], [place]

## 2017-01-25 NOTE — ED Provider Notes
Ketones -UR Negative  Negative   POCT TROPININ I - Normal    Troponin I (Point of Care) <0.05  0.00 - 0.23 ng/mL   URINALYSIS W/MICROSCOPY    Color -Ur Risk manager, UA Clear  Hazy    Specific Gravity, Urine 1.005  1.003 - 1.030    pH, Urine 8.0  4.5 - 8.0    Protein-UA Negative  Negative mg/dL    Glucose -Ur Negative  Negative mg/dL    Ketones UA Negative  Negative mg/dL    Bilirubin -Ur Negative  Negative    Blood -Ur Negative  Negative    Nitrite -Ur Negative  Negative    Urobilinogen -Ur Normal  Normal    Leukocytes -Ur Negative  Negative    RBC -Ur 3  0 - 5 /HPF    WBC -Ur <1  0 - 5 /HPF    Bacteria -Ur None seen  None seen /HPF    Mucus -Ur Rare  2+ /LPF    ASCORBIC ACID Negative  20 mg/dL   TROPONIN T   TROPONIN T   POCT URINALYSIS AUTO W/O MICROSCOPY   POCT TROPININ I   CBC AND DIFFERENTIAL         Imaging (Read by ED Provider):  Per Radiology: Xr Chest Single View  Impression: No radiographic evidence of acute cardiopulmonary disease. No significant change compared to the prior exam.         EKG (Read by ED Provider):  NSR with diffuse t-wave inversions (similar to prior), repeat with reversal of inversion at V5.        ED Course & Re-Evaluation     ED Course   Comment By Time   51yo BF with SLE pw typical angina. Onset while sleeping with diaphoresis, jaw pain, arm numbness. Concern for ACS vs lupus flare. Basic labs ordered. ASA and nitro ordered. Maryjo Rochester, MD 02/10 [phone]   EKG with diffuse t-wave inversions, seen previously. Labs pending. Maryjo Rochester, MD 02/10 0236   Labs unremarkable. Repeat EKG with flipped t-wave at V5. Given compilation of symptoms with significant history, will admit for ACS w/u. Fentanyl and zofran ordered. Maryjo Rochester, MD 02/10 [phone]         MDM   Decide to obtain history from someone other than the patient: No    Decide to obtain previous medical records: No    Clinical Lab Test(s): Ordered and Reviewed

## 2017-01-25 NOTE — ED Notes
Transport here to take pt to floor. Pt left dept via w/c in nad with all of her belongings.

## 2017-01-25 NOTE — ED Notes
Pt given 1st nitro @ 0240.  At 0245 pt reported chest pain 9/10.  Pt given 2nd nitro @ 0245.  At 0250 pt reported chest pain 6/10.  Pt refused a 3rd nitro and stated "I liven in chronic pain and this is as good as it gets."  Pt refused third nitro.

## 2017-01-25 NOTE — H&P
Hospitalist Service  History and Physical Examination         PCP Gretchen Short  Chief Complaint:      Chest Pain        Source of history: Patient and Medical Records   HPI:   51 y.o. female Nichole Colon with  [place] as below come in for chest pain , Pt has sudden onset of chest pain , while sleeping in bed , its substernal , pressure like , 8/10 , also has Pain on L side of chest with radiation go Pain to Jaw and tingling in extremity with associated SOB , nausea , sweating . No orthopnea , PND  Compliant with medications . Marland KitchenPt has h.o Coronary artery spasm and is on CCB . Pt gives h/o similar pain in the past and work up with stress test Neg at that time . No h/o drug abuse . No h/o DVT/PE . No recent long travel      stress test 05/2011     Conclusions:     The pre-test probability of CAD was not calculated.       Not consistent with ischemia by EKG and patient achieved the target heart rate.At rest LV   is normal. At peak stress LV wall motion is hyperkinetic. No evidence of inducible cardiac   ischemia at achieved HR      ECHO 05/2011   Conclusions:  1. - Study Quality - Adequate.  2. - Normal left ventricular size and wall thickness. Normal left ventricular function. All   segments contract normally. Left ventricular ejection fraction ranges from 60-65%.  3. - Estimated RV systolic pressure is 33 mmHg, assuming right atrial pressure equals 8   mmHg?consistent with mild pulmonary hypertension.  4. - Mild mitral annular calcification, otherwise normal mitral valve.  5. - Mild tricuspid regurgitation.  6. - Mild pulmonic regurgitation.  7. - Compared to a Echo study dated 04/03/2010 there is no significant change.        Past Medical History:   Diagnosis Date   ? Atrial fib/flutter, transient    ? Blood clot in vein    ? CAD (coronary artery disease)    ? Cerebral artery occlusion with cerebral infarction    ? COPD (chronic obstructive pulmonary disease)    ? Hyperlipemia    ? Hypertension    ? Lupus

## 2017-01-25 NOTE — H&P
cycloSPORINE (RESTASIS) 0.05 % OP Emulsion Place 1 drop into both eyes every 12 hours. 01/13/17  Yes Liam Rogers, MD   dilTIAZem (DILT-XR) 180 MG PO Capsule Extended Release 24 Hour TAKE 1 CAPSULE BY MOUTH DAILY 01/15/17  Yes Gretchen Short, MD   ergocalciferol (vitamin D-2) 50000 units PO Capsule TAKE 1 CAPSULE BY MOUTH 1 TIME A WEEK ON AN EMPTY STOMACH 01/15/17  Yes Gretchen Short, MD   gabapentin (NEURONTIN) 600 MG Tablet TAKE 1 TABLET BY MOUTH THREE TIMES DAILY 02/12/16  Yes Gretchen Short, MD   hydroCHLOROthiazide (HYDRODIURIL) 25 MG PO Tablet TAKE 1 TABLET BY MOUTH DAILY 01/15/17  Yes Gretchen Short, MD   pilocarpine (SALAGEN) 5 MG Tablet Take 1 tablet by mouth 2 times daily. 02/20/16  Yes Thway, Myint Vic Blackbird, MD   prednisoLONE acetate (PRED FORTE) 1 % OP Suspension Place 1 drop into both eyes 4 times daily for 14 days. 01/14/17 01/28/17 Yes Verdene Lennert, MD   predniSONE (DELTASONE) 20 MG PO Tablet Take 3 tabs daily x 4 days then take 2 tabs daily x 3 days then take 1 tab daily x 2 days and STOP 01/21/17  Yes [name], ARNP   sertraline (ZOLOFT) 50 MG PO Tablet TAKE 1 TABLET BY MOUTH EVERY DAY 01/15/17  Yes Gretchen Short, MD   solifenacin (VESICARE) 5 MG PO Tablet Take 1 tablet by mouth daily. 01/15/17  Yes Gretchen Short, MD   topiramate (TOPAMAX) 50 MG PO Tablet Take by mouth 2 times daily.   Yes Information, Historical   hydroxychloroquine (PLAQUENIL) 200 MG Tablet Take 1 tablet by mouth 2 times daily. 01/31/16   Thway, Arlyss Gandy, MD   meclizine (ANTIVERT) 25 MG PO Tablet Take 1 tablet by mouth 3 times daily as needed. 01/15/17   Gretchen Short, MD   zonisamide (ZONEGRAN) 100 MG PO Capsule Take 1 caps nightly for 1 week then take 2 caps nightly for headache prevention 01/21/17   [name], ARNP         Assessment and Plan:   ? Atypical chest pain with h./o SLE : EKG reviewed by me TWI In anterolateral leads , No change from prior . Trend CE . Check ECHO and

## 2017-01-25 NOTE — H&P
Ht 1.702 m (5\' 7" ) - Wt 81.6 kg (180 lb) - SpO2 100% - BMI 28.19 kg/m2, 100%, No intake or output data in the 24 hours ending 01/25/17 0357  Gen: Patient is not in acute distress, No diaphoresis  Eyes: PERRL; no scleral Jaundice, no pallor  ENT: normal external exam of ears and nose; oral mucosa moist  Resp.: No wheezing, rales, ronchi heard; use of accessory muscles not seen  CV: S1, S2, Regular rate and rhythm; murmurs not heard; carotid bruit not heard; JVD not seen  GI: No abdominal tenderness; no hepatosplenomegaly; bowel sounds present  MSK: No joint swellings and No leg swellings    CNS: No focal deficits, patient is alert and oriented,    Skin: Good turgor, No rashes    Lymphatics: No axillary or cervical lymphadenopathy    Psychiatry: Patient is Not agitated or anxious    Data Review:   Results for orders placed or performed during the hospital encounter of 01/25/17 (from the past 48 hour(s))   POCT Urinalysis w/o Microscopy auto    Collection Time: 01/25/17  1:46 AM   Result Value    Color -Ur Yellow    Clarity, UA Clear    Spec Grav 1.010    pH 7.0 (L)    Urobilinogen -Ur 0.2    Nitrite -Ur Negative    Protein-UA Negative    Glucose -Ur Negative    Blood, UA Trace (A)    WBC, UA Negative    Bilirubin -Ur Negative    Ketones -UR Negative   CBC and Differential    Collection Time: 01/25/17  2:31 AM    Narrative    The following orders were created for panel order CBC and Differential.  Procedure                               Abnormality         Status                     ---------                               -----------         ------                     CBC with Differential pa.Marland KitchenMarland KitchenBK:[id number]  Abnormal            Final result                 Please view results for these tests on the individual orders.   Basic Metabolic Panel    Collection Time: 01/25/17  2:31 AM   Result Value    Sodium 142    Potassium 3.5    Chloride 103    CO2 22    Urea Nitrogen 13    Creatinine 0.71    BUN/Creatinine Ratio 18.3

## 2017-01-25 NOTE — Progress Notes
PROCEDURE: XR CHEST SINGLE VIEW Comparison: Chest x-ray from June 09, 2011 History: Chest pain Findings: The trachea is midline. Cardiac silhouette is within normal limits. There is no pleural effusion. There is no focal consolidation. There is no pneumothorax. There is no acute osseous abnormality.     Impression: No radiographic evidence of acute cardiopulmonary disease. No significant change compared to the prior exam. I personally reviewed the images and the residents findings and agree with the above. Read By Cordie Grice  Electronically Verified By - Cordie Grice  Released Date Time - 01/25/2017 9:18 AM  Resident - Sonia Baller          Assessment & Plan:   # chest  Pain atypical trend CE EKG echo and NST AS PT CAN NOT DO TREADMILL STRESS TEST     # Headache / Migraine d/o seen by Neuro , No Triptan due to coronary spasm h/o , Now on Topamax taper , Prednisone taper for HA  And start on Zonegran once off Topamax . See Neuro Note 01/21/2017 for further details . If any acute HA in [place] , order Fioricet as recommended by Neuro .  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     Dispo:MI   Code: FULL       Fredrik Cove, MD    01/25/2017 10:17 AM

## 2017-01-25 NOTE — ED Notes
Pt transported to stress test.

## 2017-01-25 NOTE — ED Triage Notes
A/ox3 c/o  Stabbing and pressure chest pain tingling in left side of jaw and sob for 2 hours c/o nausea. To ecc.

## 2017-01-25 NOTE — ED Notes
Report received by Jeneen Rinks, RN. Pt currently sleeping but easily arousable. Pt a/o x 4, respirations are even and unlabored, and in NAD. Pt has no current complaints at this time. Bed in the lowest and locked position with side rails up x 2. Pt provided with belongings bag. Will continue to monitor. Care assumed.

## 2017-01-25 NOTE — ED Notes
Pt stated that IV in R AC was hurting and "just didn't feel right." IV flushed and immediately swelled up/infiltrated.  IV removed and site bandaged. NAD.

## 2017-01-25 NOTE — ED Provider Notes
Blood, UA Trace (*) Negative    WBC, UA Negative  Negative    Bilirubin -Ur Negative  Negative    Ketones -UR Negative  Negative   POCT URINALYSIS AUTO W/O MICROSCOPY         Imaging (Read by ED Provider):  ***      EKG (Read by ED Provider):  ***        ED Course & Re-Evaluation     ED Course         MDM   Decide to obtain history from someone other than the patient: No    Decide to obtain previous medical records: No    Clinical Lab Test(s): Ordered and Reviewed    Diagnostic Tests (Radiology, EKG): Ordered and Reviewed    Independent Visualization (ED [place], Wet Prep, Other): No    Discussed patient with NON-ED Provider: {SH ED Dutch Quint MDM - ANOTHER PROVIDER:[id number]}      ED Disposition   ED Disposition: No ED Disposition Set      ED Clinical Impression   ED Clinical Impression:   Chest pain, unspecified type      ED Patient Status   Patient Status:   {SH ED [facility] PATIENT [phone]}        ED Medical Evaluation Initiated   Medical Evaluation Initiated:   Yes, filed at 01/25/17 0209  by Maryjo Rochester, MD

## 2017-01-25 NOTE — H&P
treadmill stress test . Pt says that she had Treadmill stress test 7 months ago here at [facility] ? , last stress test at [facility] 2012 per Epic records     ? Headache / Migraine d/o seen by Neuro , No Triptan due to coronary spasm h/o , Now on Topamax taper , Prednisone taper for HA  And start on Zonegran once off Topamax . See Neuro Note 01/21/2017 for further details . If any acute HA in [place] , order Fioricet as recommended by Neuro .  Dont treat no more than 2 HA per week with Fioricet   ? SLE / Sjogrens , follows with Rheum as OP , cont Pilocarpine , Plaquenil stopped due to no follow up [facility] since 6/16   ? HTN cont Home medications   ? Neuropathy cont Gabapentin     Dispo Pending above       01/25/2017, 3:57 AM  Yehuda Mao, MD

## 2017-01-25 NOTE — ED Provider Notes
History     Chief Complaint   Patient presents with   ? Chest Pain       HPI Comments: 50yo BF with pmh SLE pw chest pain  Onset at 0000 this am while sleeping  Substernal sharp pressure  Radiation into L side jaw with "tongue heaviness"  Tingling into LUE  Associated SOB, nausea, diaphoresis  Recently started on prednisone 20mg  per neurology  Different in sensation compared to lupus flare  H/o coronary spasm (on diltiazem)  Cath previously with 10% at unknown vessel (over 56yrs prior)  Stress test negative previously    Patient is a 51 y.o. female presenting with chest pain. The history is provided by the patient.   Chest Pain   Pain location:  Substernal area  Pain quality: aching, pressure, sharp and shooting    Pain radiates to:  L jaw and L arm  Pain severity:  Severe  Onset quality:  Sudden  Duration:  2 hours  Timing:  Constant  Progression:  Improving  Chronicity:  New  Context: at rest    Relieved by:  Nothing  Worsened by:  Movement  Ineffective treatments:  None tried  Associated symptoms: diaphoresis, numbness, palpitations and shortness of breath    Associated symptoms: no abdominal pain, no back pain, no cough, no dizziness, no dysphagia, no fever, no headache, no nausea, no vomiting and no weakness        Allergies   Allergen Reactions   ? No Known Drug Allergy      No Known Drug Allergies       Patient's Medications   New Prescriptions    No medications on file   Previous Medications    BUTALBITAL-ACETAMINOPHEN-CAFFEINE (FIORICET, ESGIC) 50-325-40 MG PO TABLET    Take 1-2 tabs at headache onset. May repeat 1 tab in 6 hours if needed. Do not treat more then 2 headaches per week.    CYCLOSPORINE (RESTASIS) 0.05 % OP EMULSION    Place 1 drop into both eyes every 12 hours.    DILTIAZEM (DILT-XR) 180 MG PO CAPSULE EXTENDED RELEASE 24 HOUR    TAKE 1 CAPSULE BY MOUTH DAILY    ERGOCALCIFEROL (VITAMIN D-2) 50000 UNITS PO CAPSULE    TAKE 1 CAPSULE BY MOUTH 1 TIME A WEEK ON AN EMPTY STOMACH

## 2017-01-25 NOTE — Progress Notes
ENT/Neck: Trachea midline. MMM. Supple neck.  Lymphatics: no cervical and inguinal lymphadenopathy  Chest: clear to ausculation, no wheezes or crackles  Cardiac: RRR, normal S1 and S2, no murmurs appreciated  Abdomen: soft, nontender, nondistended, no rebound or guarding  Extremities: no swelling or erythema noted  Neuro: alert and oriented, no focal neurologic deficits noted, CN II-XII grossly intact  Skin: no lesions or rashes noted, skin warm to touch    Microbiology:     Labs:  Recent Results (from the past 24 hour(s))   POCT Urinalysis w/o Microscopy auto    Collection Time: 01/25/17  1:46 AM   Result Value Ref Range    Color -Ur Yellow     Clarity, UA Clear     Spec Grav 1.010 1.003 - 1.030    pH 7.0 (L) 7.4 - 7.4    Urobilinogen -Ur 0.2 <=2.0 E.U./dL    Nitrite -Ur Negative Negative    Protein-UA Negative Negative mg/dL    Glucose -Ur Negative Negative mg/dL    Blood, UA Trace (A) Negative    WBC, UA Negative Negative    Bilirubin -Ur Negative Negative    Ketones -UR Negative Negative   Basic Metabolic Panel    Collection Time: 01/25/17  2:31 AM   Result Value Ref Range    Sodium 142 135 - 145 mmol/L    Potassium 3.5 3.3 - 4.6 mmol/L    Chloride 103 101 - 110 mmol/L    CO2 22 21 - 29 mmol/L    Urea Nitrogen 13 6 - 22 mg/dL    Creatinine 0.71 0.51 - 0.95 mg/dL    BUN/Creatinine Ratio 18.3 6.0 - 22.0 (calc)    Glucose 124 (H) 71 - 99 mg/dL    Calcium 10.0 8.6 - 10.0 mg/dL    Osmolality Calc 284.7     Anion Gap 17 (H) 4 - 16 mmol/L    EGFR >59 mL/min/1.73M2   CBC with Differential panel result    Collection Time: 01/25/17  2:31 AM   Result Value Ref Range    WBC 4.63 4.5 - 11 x10E3/uL    RBC 4.80 4.20 - 5.40 x10E6/uL    Hemoglobin 13.0 12.0 - 16.0 g/dL    Hematocrit 41.0 37.0 - 47.0 %    MCV 85.4 82.0 - 101.0 fl    MCH 27.1 27.0 - 34.0 pg    MCHC 31.7 31.0 - 36.0 g/dL    RDW 13.3 12.0 - 16.1 %    Platelet Count 231 140 - 440 thou/cu mm    MPV 9.8 9.5 - 11.5 fl    nRBC % 0.0 0.0 - 1.0 %

## 2017-01-25 NOTE — ED Notes
Hand off given to [facility].  VS upsated. Pt in NAD.

## 2017-01-25 NOTE — ED Provider Notes
?   Stroke Mother    ? Vision Loss Mother    ? Asthma Brother    ? Birth Defects Brother    ? Early Death Brother    ? Heart Disease Brother    ? High Cholesterol Brother    ? Hypertension Brother    ? Asthma Daughter    ? High Cholesterol Daughter    ? Hypertension Daughter    ? Asthma Maternal Aunt    ? Heart Disease Maternal Aunt    ? Hypertension Maternal Aunt    ? Miscarriages / Stillbirths Maternal Aunt    ? Stroke Maternal Aunt    ? Vision Loss Maternal Aunt    ? Heart Disease Maternal Uncle    ? Hypertension Maternal Uncle    ? Stroke Maternal Uncle    ? Arthritis Maternal Grandmother    ? Asthma Maternal Grandmother    ? Clotting Disorder Maternal Grandmother    ? Heart Disease Maternal Grandmother    ? Hypertension Maternal Grandmother    ? Miscarriages / Stillbirths Maternal Grandmother    ? Vision Loss Maternal Grandmother        Social History     Social History   ? Marital status: Single     Spouse name: N/A   ? Number of children: N/A   ? Years of education: N/A     Social History Main Topics   ? Smoking status: Never Smoker   ? Smokeless tobacco: Never Used   ? Alcohol use No   ? Drug use: No   ? Sexual activity: Not Currently     Other Topics Concern   ? None     Social History Narrative       Review of Systems   Constitutional: Positive for diaphoresis. Negative for fever, chills and appetite change.   HENT: Negative for nasal discharge and trouble swallowing.    Eyes: Negative for visual disturbance.   Respiratory: Positive for chest tightness and shortness of breath. Negative for cough.    Cardiovascular: Positive for chest pain and palpitations.   Gastrointestinal: Negative for nausea, vomiting, abdominal pain, diarrhea, blood in stool and abdominal distention.   Genitourinary: Negative for frequency, hematuria and difficulty urinating.   Musculoskeletal: Negative for back pain and arthralgias.   Neurological: Positive for numbness. Negative for dizziness, syncope, weakness and headaches.

## 2017-01-25 NOTE — ED Provider Notes
Diagnostic Tests (Radiology, EKG): Ordered and Reviewed    Independent Visualization (ED [place], Wet Prep, Other): No    Discussed patient with NON-ED Provider: Admitting Team      ED Disposition   ED Disposition: Admit      ED Clinical Impression   ED Clinical Impression:   Chest pain, unspecified type  Palpitation  Typical angina  Sjogren's syndrome with keratoconjunctivitis sicca  Systemic lupus erythematosus, unspecified SLE type, unspecified organ involvement status      ED Patient Status   Patient Status:   [place]        ED Medical Evaluation Initiated   Medical Evaluation Initiated:   Yes, filed at 01/25/17 0209  by Maryjo Rochester, MD             Maryjo Rochester, MD  Resident  01/25/17 [phone]

## 2017-01-26 NOTE — Progress Notes
ECG stat, notified EKG tech.

## 2017-01-26 NOTE — Plan of Care
Problem: Pain, Acute & Chronic  Goal: Pain is relieved/acceptable level with minimal side effects    Intervention: Assess pain level  Patient states chest pain 6/10 and feels "a little pressure". Patient stated she also feels "fluttery" in middle of chest. RN asked if this is new. Patient stated the fluttery is not new but now feels like "birds fluttering in chest instead of butterflys". Upon reassessment, patient stated pressure in chest "feels better". Will continue to monitor patient.

## 2017-01-26 NOTE — Progress Notes
Received pt in bed from ED in the room. Introduced self and explained role. Pt oriented to room. Fall protocol explained and fall sheet signed. Will continue to monitor.

## 2017-01-26 NOTE — Progress Notes
Lymphatics: no cervical and inguinal lymphadenopathy  Chest: clear to ausculation, no wheezes or crackles  Cardiac: RRR, normal S1 and S2, no murmurs appreciated  Abdomen: soft, nontender, nondistended, no rebound or guarding  Extremities: no swelling or erythema noted  Neuro: alert and oriented, no focal neurologic deficits noted, CN II-XII grossly intact  Skin: no lesions or rashes noted, skin warm to touch    Microbiology:     Labs:  Recent Results (from the past 24 hour(s))   Troponin T    Collection Time: 01/25/17  1:59 PM   Result Value Ref Range    Troponin T <0.01 0.00 - 0.04 ng/ml         Radiology:  Per Radiology: Xr Chest Single View    Result Date: 01/25/2017  PROCEDURE: XR CHEST SINGLE VIEW Comparison: Chest x-ray from June 09, 2011 History: Chest pain Findings: The trachea is midline. Cardiac silhouette is within normal limits. There is no pleural effusion. There is no focal consolidation. There is no pneumothorax. There is no acute osseous abnormality.     Impression: No radiographic evidence of acute cardiopulmonary disease. No significant change compared to the prior exam. I personally reviewed the images and the residents findings and agree with the above. Read By Cordie Grice  Electronically Verified By - Cordie Grice  Released Date Time - 01/25/2017 9:18 AM  Resident - Sonia Baller          Assessment & Plan:   # chest  Pain atypical trend CE EKG echo and MPI for AM      # Headache / Migraine d/o seen by Neuro recently as OP , No Triptan due to coronary spasm h/o , Now on Topamax taper , Prednisone taper for HA  And start on Zonegran once off Topamax . See Neuro Note 01/21/2017 for further details . If any acute HA in [place] , order Fioricet as recommended by Neuro .  Dont treat no more than 2 HA per week with Fioricet     # SLE / SJOGRENS she is on pilocarpine FU rheum as out pt     # HTN stable home meds with PRN and adjust as need it   # H/O coronary artery spasm on CCB

## 2017-01-26 NOTE — Plan of Care
Problem: Pain, Acute & Chronic  Goal: Pain is relieved/acceptable level with minimal side effects    Intervention: Assess pain level  Patient states pain in chest 5/10 and states that "it feels like slight pressure". Patient states that she is experiencing "fluttering" in chest but that she normally experiences that daily. Upon reassessment, patient states chest is still 5/10 but "feels better than before". Will continue to assess and monitor patient.

## 2017-01-26 NOTE — Progress Notes
#    Neuropathy on gabapentin    # DVT PPx.     Dispo:MI   Code: FULL    d/c plan is tomm.     Amadeo Garnet, MD    01/26/2017 1:52 PM

## 2017-01-26 NOTE — Progress Notes
EKG done at bedside.

## 2017-01-26 NOTE — Progress Notes
Department of Medicine  Hospitalist Service    Date of Admission: 01/25/2017   LOS: 0 days     Reason for admit: chest pain    Subjective:     No pain , no chest pain. No HA.     Review of Systems:  All organ systems reviewed and Review of Systems are negative except as noted above.    I have reviewed the past medical, surgical, family and social history.      Objective:     Vital Signs: Last Filed Vitals Signs: 24 Hour Range   BP: 99/57 (02/11 1109) BP: (99-132)/(57-82)    Temp: 36.9 ?C (98.4 ?F) (02/11 1109) Temp:  [36.6 ?C (97.9 ?F)-36.9 ?C (98.4 ?F)]    Pulse: 63 (02/11 1109) Pulse:  [52-71]    Resp: 18 (02/11 1109) Resp:  [18]    SpO2: 98 % (02/11 1109) SpO2:  [98 %-100 %]    Respiratory Device: Room Air / (None)                   Intake/Output Summary (Last 24 hours) at 01/26/17 1352  Last data filed at 01/26/17 1000   Gross per 24 hour   Intake              360 ml   Output                0 ml   Net              360 ml       SCHEDULED MEDS:  ? aspirin  81 mg Oral daily   ? cycloSPORINE  1 drop Both Eyes Q12H   ? dilTIAZem  180 mg Oral daily   ? enoxaparin  40 mg Subcutaneous daily   ? gabapentin  600 mg Oral TID   ? hydroCHLOROthiazide  25 mg Oral daily   ? pilocarpine  5 mg Oral BID   ? predniSONE  60 mg Oral daily   ? sertraline  50 mg Oral daily   ? solifenacin  5 mg Oral daily   ? topiramate  50 mg Oral Nightly     IV INFUSIONS:    PRN MEDS:    acetaminophen 650 mg Oral Q4H PRN   aluminum-magnesium-simethicone hydroxide 30 mL Oral Q6H PRN   dextrose 30 mL Intravenous PRN   glucose 16 g Oral PRN   hydrALAZINE 10 mg Intravenous Q6H PRN   nitroGLYCERIN 0.4 mg Sublingual Q5 Min PRN   ondansetron 4 mg Oral Q6H PRN   Or      ondansetron 4 mg Intravenous Q6H PRN   perflutren lipid microspheres 0.2-1.3 mL Intravenous PRN   senna-docusate 1 tablet Oral BID PRN       Physical Exam:  General: alert, cooperative, no acute distress  Eyes: EOMI, PERRL, anicteric sclera  ENT/Neck: Trachea midline. MMM. Supple neck.

## 2017-01-26 NOTE — Plan of Care
Problem: Altered Cardiac Function  Goal: BP, HR, heart rhythm WNL for patient    Intervention: Monitor vital signs  Telemetry on as per orders. Will continue to monitor.

## 2017-01-27 MED ORDER — REGADENOSON 0.4 MG/5ML IV SOLN
.4 mg | Freq: Once | INTRAVENOUS | Status: CP
Start: 2017-01-27 — End: ?

## 2017-01-27 NOTE — Plan of Care
Problem: Pain, Acute & Chronic  Goal: Pain is relieved/acceptable level with minimal side effects    Intervention: Assess pain level  Pain level assessed  Intervention: Medicate with scheduled pain medication as ordered  Schedule medication provided

## 2017-01-27 NOTE — Progress Notes
Zonegran once off Topamax . See Neuro Note 01/21/2017 for further details . If any acute HA in [place] , order Fioricet as recommended by Neuro .  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     Dispo:MI   Code: FULL       Fredrik Cove, MD    01/27/2017 12:07 PM

## 2017-01-27 NOTE — Progress Notes
Pt transported to nuclear med for stress test

## 2017-01-27 NOTE — Plan of Care
Rest and Stress Lexiscan Sestamibi completed; patient needs to eat and drink and will return to the nuclear lab for final images

## 2017-01-27 NOTE — Progress Notes
Tc99m MIBI was given IV for MPI

## 2017-01-27 NOTE — Progress Notes
01/27/17 11:36 AM Care Coordinator scheduled the patient a hospital discharge follow up appointment with [facility] Provider. Appointment scheduled for February 26 at 11:00 am and is listed on AVS for reference.  Nichole [place]

## 2017-01-27 NOTE — Progress Notes
Pt  C/o IV is burning , IV flushed whithout difficulty with blood returned. No sign of infiltration noted. Endorsed f/up shift.

## 2017-01-27 NOTE — Progress Notes
General: alert, cooperative, no acute distress  Eyes: EOMI, PERRL, anicteric sclera  ENT/Neck: Trachea midline. MMM. Supple neck.  Lymphatics: no cervical and inguinal lymphadenopathy  Chest: clear to ausculation, no wheezes or crackles  Cardiac: RRR, normal S1 and S2, no murmurs appreciated  Abdomen: soft, nontender, nondistended, no rebound or guarding  Extremities: no swelling or erythema noted  Neuro: alert and oriented, no focal neurologic deficits noted, CN II-XII grossly intact  Skin: no lesions or rashes noted, skin warm to touch    Microbiology:     Labs:  Recent Results (from the past 24 hour(s))   ECG - Adult    Collection Time: 01/26/17  3:18 PM   Result Value Ref Range    Ventricular Rate 60 BPM    Atrial Rate 60 BPM    P-R Interval 140 ms    QRS Duration 90 ms    Q-T Interval 400 ms    QTC Calculation (Bezet) 400 ms    Calculated P Axis 61 degrees    Calculated R Axis 13 degrees    Calculated T Axis -34 degrees         Radiology:  Per Radiology: Xr Chest Single View    Result Date: 01/25/2017  PROCEDURE: XR CHEST SINGLE VIEW Comparison: Chest x-ray from June 09, 2011 History: Chest pain Findings: The trachea is midline. Cardiac silhouette is within normal limits. There is no pleural effusion. There is no focal consolidation. There is no pneumothorax. There is no acute osseous abnormality.     Impression: No radiographic evidence of acute cardiopulmonary disease. No significant change compared to the prior exam. I personally reviewed the images and the residents findings and agree with the above. Read By Cordie Grice  Electronically Verified By - Cordie Grice  Released Date Time - 01/25/2017 9:18 AM  Resident - Sonia Baller          Assessment & Plan:   # chest  Pain atypical trend CE  Negative EKG seen  Pending echo and NST      # Headache / Migraine d/o seen by Neuro , No Triptan due to coronary spasm h/o , Now on Topamax taper , Prednisone taper for HA  And start on

## 2017-01-27 NOTE — Progress Notes
Department of Medicine  Hospitalist Service    Date of Admission: 01/25/2017   LOS: 0 days     Reason for admit: chest pain    Subjective:     Patient was seen and examined at bedside today.  Pt  is not in acute Respiratory distress currently.  Pt  is calm . Denied any complaints today.    Review of Systems:  All organ systems reviewed and Review of Systems are negative except as noted above.    I have reviewed the past medical, surgical, family and social history.      Objective:     Vital Signs: Last Filed Vitals Signs: 24 Hour Range   BP: 119/75 (02/12 1152) BP: (105-126)/(65-84)    Temp: 36.9 ?C (98.4 ?F) (02/12 1152) Temp:  [36.6 ?C (97.8 ?F)-36.9 ?C (98.5 ?F)]    Pulse: 50 (02/12 1152) Pulse:  [50-61]    Resp: 18 (02/12 1152) Resp:  [16-18]    SpO2: 99 % (02/12 1152) SpO2:  [97 %-99 %]    Respiratory Device: Room Air / (None)        Pain Rating ([facility] Only) : 0          Intake/Output Summary (Last 24 hours) at 01/27/17 1207  Last data filed at 01/27/17 0800   Gross per 24 hour   Intake             1200 ml   Output                0 ml   Net             1200 ml       SCHEDULED MEDS:  ? aspirin  81 mg Oral daily   ? cycloSPORINE  1 drop Both Eyes Q12H   ? dilTIAZem  180 mg Oral daily   ? enoxaparin  40 mg Subcutaneous daily   ? gabapentin  600 mg Oral TID   ? hydroCHLOROthiazide  25 mg Oral daily   ? pilocarpine  5 mg Oral BID   ? sertraline  50 mg Oral daily   ? solifenacin  5 mg Oral daily   ? topiramate  50 mg Oral Nightly     IV INFUSIONS:    PRN MEDS:    acetaminophen 650 mg Oral Q4H PRN   aluminum-magnesium-simethicone hydroxide 30 mL Oral Q6H PRN   dextrose 30 mL Intravenous PRN   glucose 16 g Oral PRN   hydrALAZINE 10 mg Intravenous Q6H PRN   nitroGLYCERIN 0.4 mg Sublingual Q5 Min PRN   ondansetron 4 mg Oral Q6H PRN   Or      ondansetron 4 mg Intravenous Q6H PRN   perflutren lipid microspheres 0.2-1.3 mL Intravenous PRN   senna-docusate 1 tablet Oral BID PRN       Physical Exam:

## 2017-01-28 ENCOUNTER — Ambulatory Visit: Attending: Ophthalmology | Primary: Internal Medicine

## 2017-01-28 DIAGNOSIS — J449 Chronic obstructive pulmonary disease, unspecified: Secondary | ICD-10-CM

## 2017-01-28 DIAGNOSIS — Z9119 Patient's noncompliance with other medical treatment and regimen: Secondary | ICD-10-CM

## 2017-01-28 DIAGNOSIS — R251 Tremor, unspecified: Secondary | ICD-10-CM

## 2017-01-28 DIAGNOSIS — R002 Palpitations: Secondary | ICD-10-CM

## 2017-01-28 DIAGNOSIS — R9431 Abnormal electrocardiogram [ECG] [EKG]: Secondary | ICD-10-CM

## 2017-01-28 DIAGNOSIS — R072 Precordial pain: Secondary | ICD-10-CM

## 2017-01-28 DIAGNOSIS — I4891 Unspecified atrial fibrillation: Secondary | ICD-10-CM

## 2017-01-28 DIAGNOSIS — R0602 Shortness of breath: Secondary | ICD-10-CM

## 2017-01-28 DIAGNOSIS — Z86711 Personal history of pulmonary embolism: Secondary | ICD-10-CM

## 2017-01-28 DIAGNOSIS — R0789 Other chest pain: Principal | ICD-10-CM

## 2017-01-28 DIAGNOSIS — G459 Transient cerebral ischemic attack, unspecified: Secondary | ICD-10-CM

## 2017-01-28 DIAGNOSIS — Z79899 Other long term (current) drug therapy: Secondary | ICD-10-CM

## 2017-01-28 DIAGNOSIS — I10 Essential (primary) hypertension: Principal | ICD-10-CM

## 2017-01-28 DIAGNOSIS — G629 Polyneuropathy, unspecified: Secondary | ICD-10-CM

## 2017-01-28 DIAGNOSIS — R6884 Jaw pain: Secondary | ICD-10-CM

## 2017-01-28 DIAGNOSIS — I2510 Atherosclerotic heart disease of native coronary artery without angina pectoris: Secondary | ICD-10-CM

## 2017-01-28 DIAGNOSIS — Z7952 Long term (current) use of systemic steroids: Secondary | ICD-10-CM

## 2017-01-28 DIAGNOSIS — M3500 Sicca syndrome, unspecified: Secondary | ICD-10-CM

## 2017-01-28 DIAGNOSIS — H40003 Preglaucoma, unspecified, bilateral: Principal | ICD-10-CM

## 2017-01-28 DIAGNOSIS — I6350 Cerebral infarction due to unspecified occlusion or stenosis of unspecified cerebral artery: Secondary | ICD-10-CM

## 2017-01-28 DIAGNOSIS — Z8249 Family history of ischemic heart disease and other diseases of the circulatory system: Secondary | ICD-10-CM

## 2017-01-28 DIAGNOSIS — IMO0002 Atrial fib/flutter, transient: Secondary | ICD-10-CM

## 2017-01-28 DIAGNOSIS — M329 Systemic lupus erythematosus, unspecified: Secondary | ICD-10-CM

## 2017-01-28 DIAGNOSIS — I2699 Other pulmonary embolism without acute cor pulmonale: Secondary | ICD-10-CM

## 2017-01-28 DIAGNOSIS — R61 Generalized hyperhidrosis: Secondary | ICD-10-CM

## 2017-01-28 DIAGNOSIS — I8290 Acute embolism and thrombosis of unspecified vein: Secondary | ICD-10-CM

## 2017-01-28 DIAGNOSIS — M1990 Unspecified osteoarthritis, unspecified site: Secondary | ICD-10-CM

## 2017-01-28 DIAGNOSIS — Z8673 Personal history of transient ischemic attack (TIA), and cerebral infarction without residual deficits: Secondary | ICD-10-CM

## 2017-01-28 DIAGNOSIS — M3501 Sicca syndrome with keratoconjunctivitis: Secondary | ICD-10-CM

## 2017-01-28 DIAGNOSIS — R110 Nausea: Secondary | ICD-10-CM

## 2017-01-28 DIAGNOSIS — I739 Peripheral vascular disease, unspecified: Secondary | ICD-10-CM

## 2017-01-28 DIAGNOSIS — E785 Hyperlipidemia, unspecified: Secondary | ICD-10-CM

## 2017-01-28 DIAGNOSIS — R51 Headache: Secondary | ICD-10-CM

## 2017-01-28 NOTE — Progress Notes
- >  5 years of use: almost, 5 years per history   - concurrent renal disease: no   - concurrent tamoxifen use: no   - pre-existing macular disease: no  - Screening   - Baseline HVF (10-2 white SITA): needs to be performed   - Baseline SD OCT 12/2016: wnl OU    - recommend annual screening with repeat testing after 5 years of exposure  - discussed risk of toxicity and possible signs/symptoms with patient  - continue per primary/rheumatology       RTC: 4-6 weeks, HVF 10-2 white, and OCT RNFL/pachymetry at test review visit , or sooner if any problems arise    Verdene Lennert, MD  Dept of Ophthalmology

## 2017-01-28 NOTE — Discharge Summary
Take 1 caps nightly for 1 week then take 2 caps nightly for headache prevention                         Discharge Orders  Diet regular     For patients with heart or kidney problems: Measure weight daily   If I have heart or kidney problems, I will record my daily weight and call my physician to report a gain of 3 lbs or more in 5 days.       Activity as tolerated     Symptoms to monitor after discharge   For any questions or concerns and to report any of the following symptoms/problems: Worsening of current symptoms, Chest pain, Shortness of breath, Dizziness, Swelling in ankles or legs, Pain uncontrolled by medication, Drainage or foul odor from incision, Extensive bruising or discoloration, Nausea/vomiting, Elevated temperature. Notify your physician.     Smoking cessation   If you smoke, stop.  If you don't smoke, do not start.  Avoid second hand smoke.  For help call the toll-free QuitLine at 3102942610.     WARFARIN (Coumadin) Education is NOT Required     The orders/medications listed here were reviewed, ordered and electronically signed by:   Fredrik Cove, MD          Signed:  Ashley Murrain Dwal  01/28/2017 7:53 AM      I discussed the discharge plan face to face with the patient, who agrees to follow up with above said recommendations.    Total discharge time 55 min

## 2017-01-28 NOTE — Progress Notes
times daily. 01/31/16   Thway, Arlyss Gandy, MD   meclizine (ANTIVERT) 25 MG PO Tablet Take 1 tablet by mouth 3 times daily as needed. 01/15/17   Gretchen Short, MD   pilocarpine (SALAGEN) 5 MG Tablet Take 1 tablet by mouth 2 times daily. 02/20/16   Thway, Arlyss Gandy, MD   prednisoLONE acetate (PRED FORTE) 1 % OP Suspension Place 1 drop into both eyes 4 times daily for 14 days. 01/14/17 01/28/17  Verdene Lennert, MD   predniSONE (DELTASONE) 20 MG PO Tablet Take 3 tabs daily x 4 days then take 2 tabs daily x 3 days then take 1 tab daily x 2 days and STOP 01/21/17   [name], ARNP   sertraline (ZOLOFT) 50 MG PO Tablet TAKE 1 TABLET BY MOUTH EVERY DAY 01/15/17   Gretchen Short, MD   solifenacin (VESICARE) 5 MG PO Tablet Take 1 tablet by mouth daily. 01/15/17   Gretchen Short, MD   topiramate (TOPAMAX) 50 MG PO Tablet Take by mouth 2 times daily.    Information, Historical   zonisamide (ZONEGRAN) 100 MG PO Capsule Take 1 caps nightly for 1 week then take 2 caps nightly for headache prevention 01/21/17   [name], ARNP         Assesment and Plan    Nichole Colon was seen today for follow - up: review test results.    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 with rim artifact OS  - IOP has been wnl, continue to observe for now      Sjogren's syndrome with keratoconjunctivitis sicca  - continue follow up with rheumatology  - recommend artificial tears QID OU  - discussed use of preservative free artificial tears if using more frequently   - discussed minimizing environmental factors      Long-term use of Plaquenil  - currently off plaquenil   - no evidence of maculopathy at this time   - patient currently on 400 mg Plaquenil x 4-5 years  - real body weight: 81.6 kg and daily dose 4.9 mg/kg  - Patient's major risk factors for toxicity:    - >5.0 mg/kg real weight: no

## 2017-01-28 NOTE — Plan of Care
Problem: Pain, Acute & Chronic  Goal: Pain is relieved/acceptable level with minimal side effects  Pt assessed for pain upon each interaction. Pt denies pain.     Problem: Altered Cardiac Function  Goal: BP, HR, heart rhythm WNL for patient  Telemetry monitor on as per order. VS WNL. Will continue to monitor.

## 2017-01-28 NOTE — Discharge Summary
Procedures   ? Hospitalist Consult (Auto-Page)       Significant Diagnostic Studies:     Physical Exam:  Heart. S1, S2 heard  Lungs. Clear to auscultation  Abdomen. Soft, non tender  Neuro. AAOx3. No FND  Extremities. No edma    Treatments:    Nichole Colon, Nichole Colon   Home Medication Instructions [id number]    Printed on:01/28/17 0755   Medication Information                      butalbital-acetaminophen-caffeine (FIORICET, ESGIC) 50-325-40 MG PO Tablet  Take 1-2 tabs at headache onset. May repeat 1 tab in 6 hours if needed. Do not treat more then 2 headaches per week.             cycloSPORINE (RESTASIS) 0.05 % OP Emulsion  Place 1 drop into both eyes every 12 hours.             dilTIAZem (DILT-XR) 180 MG PO Capsule Extended Release 24 Hour  TAKE 1 CAPSULE BY MOUTH DAILY             ergocalciferol (vitamin D-2) 50000 units PO Capsule  TAKE 1 CAPSULE BY MOUTH 1 TIME A WEEK ON AN EMPTY STOMACH             gabapentin (NEURONTIN) 600 MG Tablet  TAKE 1 TABLET BY MOUTH THREE TIMES DAILY             hydroCHLOROthiazide (HYDRODIURIL) 25 MG PO Tablet  TAKE 1 TABLET BY MOUTH DAILY             hydroxychloroquine (PLAQUENIL) 200 MG Tablet  Take 1 tablet by mouth 2 times daily.             meclizine (ANTIVERT) 25 MG PO Tablet  Take 1 tablet by mouth 3 times daily as needed.             pilocarpine (SALAGEN) 5 MG Tablet  Take 1 tablet by mouth 2 times daily.             prednisoLONE acetate (PRED FORTE) 1 % OP Suspension  Place 1 drop into both eyes 4 times daily for 14 days.             predniSONE (DELTASONE) 20 MG PO Tablet  Take 3 tabs daily x 4 days then take 2 tabs daily x 3 days then take 1 tab daily x 2 days and STOP             sertraline (ZOLOFT) 50 MG PO Tablet  TAKE 1 TABLET BY MOUTH EVERY DAY             solifenacin (VESICARE) 5 MG PO Tablet  Take 1 tablet by mouth daily.             topiramate (TOPAMAX) 50 MG PO Tablet  Take by mouth 2 times daily.             zonisamide (ZONEGRAN) 100 MG PO Capsule

## 2017-01-28 NOTE — Discharge Summary
Department of Medicine  Hospitalist Service      Nichole Colon is a 51 y.o. female patient who is being discharged.  Patient seen and evaluated on day of d/c and stable for d/c, see physical exam below.    Admit date: 01/25/2017    Discharge date and time: No discharge date for patient encounter.     Admitting Physician: Yehuda Mao, MD     Discharge Physician: Ashley Murrain Dwal    Admission Diagnoses: <principal problem not specified>  Active Hospital Problems    Diagnosis Date Noted   ? Chest pain 01/25/2017      Resolved Hospital Problems    Diagnosis Date Noted Date Resolved   No resolved problems to display.     Discharge Diagnoses: chest pain    Discharge Condition: stable     Disposition:  home    Hospital Course: 51 y.o. female Nichole Colon with  PMH as below come in for chest pain , Pt has sudden onset of chest pain , while sleeping in bed , its substernal , pressure like , 8/10 , also has Pain on L side of chest with radiation go Pain to Jaw and tingling in extremity with associated SOB , nausea , sweating . No orthopnea , PND  Compliant with medications . Marland KitchenPt has h.o Coronary artery spasm and is on CCB . Pt gives h/o similar pain in the past and work up with stress test Neg at that time . No h/o drug abuse . No h/o DVT/PE . No recent long travel pt was admitted for # chest  Pain atypical CE  Negative EKG seen  echo and NST  Normal   # Headache / Migraine d/o seen by Neuro , No Triptan due to coronary spasm h/o , Now on Topamax taper , Prednisone taper for HA ?And start on Zonegran once off Topamax . See Neuro Note 01/21/2017 for further details . If any acute HA in [place] , order Fioricet as recommended by Neuro . ?Dont treat no more than 2 HA per week with Fioricet   # SLE / SJOGRENS she is on pilocarpine FU rheum as out pt   # HTN stable home meds with PRN and adjust as need it   # H/O coronary artery spasm on CCB   #  Neuropathy on gabapentin  Pt DC in good general condition   Consults:   Consults

## 2017-01-28 NOTE — Progress Notes
Department of Ophthalmology    HPI:   Nichole Colon is a 51 y.o. female here for HVF review  No new complaints including pain, decreased vision, flashes or floaters.     Past Medical History:   Diagnosis Date   ? Atrial fib/flutter, transient    ? Blood clot in vein    ? CAD (coronary artery disease)    ? Cerebral artery occlusion with cerebral infarction    ? COPD (chronic obstructive pulmonary disease)    ? Hyperlipemia    ? Hypertension    ? Lupus    ? Occasional tremors    ? Osteoarthritis    ? Personal history of noncompliance with medical treatment, presenting hazards to health    ? Pulmonary embolism    ? Sjogren's syndrome    ? TIA (transient ischemic attack)        Past Surgical History:   Procedure Laterality Date   ? CHOLECYSTECTOMY      C-Section   ? CHOLECYSTECTOMY      Surgery Description: Cholecystectomy;   (Created by Conversion)       Review of systems completed by technician- no changes     Current Outpatient Medications    Medication Sig Start Date End Date Taking? Authorizing Provider   butalbital-acetaminophen-caffeine (FIORICET, ESGIC) 50-325-40 MG PO Tablet Take 1-2 tabs at headache onset. May repeat 1 tab in 6 hours if needed. Do not treat more then 2 headaches per week. 01/21/17   [name], ARNP   cycloSPORINE (RESTASIS) 0.05 % OP Emulsion Place 1 drop into both eyes every 12 hours. 01/13/17   Liam Rogers, MD   dilTIAZem (DILT-XR) 180 MG PO Capsule Extended Release 24 Hour TAKE 1 CAPSULE BY MOUTH DAILY 01/15/17   Gretchen Short, MD   ergocalciferol (vitamin D-2) 50000 units PO Capsule TAKE 1 CAPSULE BY MOUTH 1 TIME A WEEK ON AN EMPTY STOMACH 01/15/17   Gretchen Short, MD   gabapentin (NEURONTIN) 600 MG Tablet TAKE 1 TABLET BY MOUTH THREE TIMES DAILY 02/12/16   Gretchen Short, MD   hydroCHLOROthiazide (HYDRODIURIL) 25 MG PO Tablet TAKE 1 TABLET BY MOUTH DAILY 01/15/17   Gretchen Short, MD   hydroxychloroquine (PLAQUENIL) 200 MG Tablet Take 1 tablet by mouth 2

## 2017-01-28 NOTE — Plan of Care Update
Patient aware of discharge orders and will leave when her daughter gets off of work.

## 2017-01-28 NOTE — Plan of Care Update
Patient being discharged, no acute distress noted. Pt verbalized understanding of discharge orders.

## 2017-01-29 ENCOUNTER — Ambulatory Visit: Attending: Internal Medicine | Primary: Internal Medicine

## 2017-01-29 DIAGNOSIS — I6350 Cerebral infarction due to unspecified occlusion or stenosis of unspecified cerebral artery: Secondary | ICD-10-CM

## 2017-01-29 DIAGNOSIS — G459 Transient cerebral ischemic attack, unspecified: Secondary | ICD-10-CM

## 2017-01-29 DIAGNOSIS — J449 Chronic obstructive pulmonary disease, unspecified: Secondary | ICD-10-CM

## 2017-01-29 DIAGNOSIS — E785 Hyperlipidemia, unspecified: Secondary | ICD-10-CM

## 2017-01-29 DIAGNOSIS — M1990 Unspecified osteoarthritis, unspecified site: Secondary | ICD-10-CM

## 2017-01-29 DIAGNOSIS — M3500 Sicca syndrome, unspecified: Secondary | ICD-10-CM

## 2017-01-29 DIAGNOSIS — I2510 Atherosclerotic heart disease of native coronary artery without angina pectoris: Secondary | ICD-10-CM

## 2017-01-29 DIAGNOSIS — I2699 Other pulmonary embolism without acute cor pulmonale: Secondary | ICD-10-CM

## 2017-01-29 DIAGNOSIS — R251 Tremor, unspecified: Secondary | ICD-10-CM

## 2017-01-29 DIAGNOSIS — E559 Vitamin D deficiency, unspecified: Secondary | ICD-10-CM

## 2017-01-29 DIAGNOSIS — M329 Systemic lupus erythematosus, unspecified: Secondary | ICD-10-CM

## 2017-01-29 DIAGNOSIS — M3219 Other organ or system involvement in systemic lupus erythematosus: Principal | ICD-10-CM

## 2017-01-29 DIAGNOSIS — I8290 Acute embolism and thrombosis of unspecified vein: Secondary | ICD-10-CM

## 2017-01-29 DIAGNOSIS — IMO0002 Atrial fib/flutter, transient: Secondary | ICD-10-CM

## 2017-01-29 DIAGNOSIS — M797 Fibromyalgia: Secondary | ICD-10-CM

## 2017-01-29 DIAGNOSIS — I10 Essential (primary) hypertension: Principal | ICD-10-CM

## 2017-01-29 DIAGNOSIS — Z9119 Patient's noncompliance with other medical treatment and regimen: Secondary | ICD-10-CM

## 2017-01-29 MED ORDER — TRAMADOL HCL 50 MG PO TABS
50 mg | Freq: Three times a day (TID) | ORAL | 0 refills | Status: CP | PRN
Start: 2017-01-29 — End: 2017-04-03

## 2017-01-29 MED ORDER — PREDNISONE 5 MG PO TABS
5 mg | Freq: Every day | ORAL | 0 refills | Status: CP
Start: 2017-01-29 — End: 2017-04-01

## 2017-01-29 MED ORDER — CYCLOBENZAPRINE HCL 10 MG PO TABS
10 mg | Freq: Three times a day (TID) | ORAL | 1 refills | Status: CP | PRN
Start: 2017-01-29 — End: 2017-10-13

## 2017-01-29 MED ORDER — HYDROXYCHLOROQUINE SULFATE 200 MG PO TABS
200 mg | Freq: Two times a day (BID) | ORAL | 3 refills | Status: CP
Start: 2017-01-29 — End: 2017-08-25

## 2017-02-03 NOTE — Addendum Note
Addended by: Eligah East. on: 02/03/2017 12:46 PM      Modules accepted: Orders

## 2017-02-03 NOTE — Progress Notes
She was restarted PLq and salagen (03/30/2015)  .      12/24/16   She is here for follow up. Last seen March 2017. Reports that she was hospitalized 2 times since last being seen for her IBS-. Denies any other complaints during that time or any surgical procedure. Per pt she still has not see the Ophthalmologist and ran out of her plaquenil about 1 week ago. She also has complaints of continued Sicca symptoms as well as sharp shooting pains in her upper back and shoulders for the past day.    01/29/17   C/o fatigue and pain all over . We stopped Plaquenil last visit because no eye exam . Now she was seen  Recently and no maculopathy .  Her treatment for fibromyalgia is adjused in last visit .       The pain is At   Knees , no swelling Morning stiffness and tender +  Patient describes the level of pain as  7 of 10.   The pain was first noticed    6 Years     It usually lasts  * always there.   When is the problem worst?  No specific time   Does anything make the problem worse?  moving around, walking ,standing , driving ,lying on my side   Does anything help or make the problem better?no     CONSTITUTIONAL SYMPTOMS:No Fever, Weight Loss or Night Sweats , +++fatigue    Review of systems for connective tissue disease revealed:   No Photosensitivity,malar rash ,discoid rash , hair loss, mouth ulcer , dysphagia, dyspnea, chest pain,SOB,  abdominal pain, blood in stool ,easy bruising  paraesthesia, weakness or Raynaud's  + nausea and heart burn     + sicca         ROS FOR FMS: Patient is reviewed for fibromyalgia symptoms.  Sleep difficulty +   Exercise :  None.      Associated symptoms:   Migraine headaches.    Irritable bowel symptoms.  ,Depression:      Review Of Systems: All other ROS were reviewed and were unremarkable      . Rheumatology Family Hx: no family hx related to rheumatology noted  Previous Report(s) Reviewed:  lab reports     Outpatient Medications:  Current Outpatient Medications

## 2017-02-03 NOTE — Progress Notes
?   No Known Drug Allergy      No Known Drug Allergies       Current Med List   Medication Sig   ? cycloSPORINE (RESTASIS) 0.05 % OP Emulsion Place 1 drop into both eyes every 12 hours.   ? dilTIAZem (DILT-XR) 180 MG PO Capsule Extended Release 24 Hour TAKE 1 CAPSULE BY MOUTH DAILY   ? ergocalciferol (vitamin D-2) 50000 units PO Capsule TAKE 1 CAPSULE BY MOUTH 1 TIME A WEEK ON AN EMPTY STOMACH   ? gabapentin (NEURONTIN) 600 MG Tablet TAKE 1 TABLET BY MOUTH THREE TIMES DAILY   ? hydroCHLOROthiazide (HYDRODIURIL) 25 MG PO Tablet TAKE 1 TABLET BY MOUTH DAILY   ? hydroxychloroquine (PLAQUENIL) 200 MG Tablet Take 1 tablet by mouth 2 times daily.   ? meclizine (ANTIVERT) 25 MG PO Tablet Take 1 tablet by mouth 3 times daily as needed.   ? pilocarpine (SALAGEN) 5 MG Tablet Take 1 tablet by mouth 2 times daily.   ? prednisoLONE acetate (PRED FORTE) 1 % OP Suspension Place 1 drop into both eyes 4 times daily for 14 days.   ? sertraline (ZOLOFT) 50 MG PO Tablet TAKE 1 TABLET BY MOUTH EVERY DAY   ? solifenacin (VESICARE) 5 MG PO Tablet Take 1 tablet by mouth daily.   ? [DISCONTINUED] topiramate (TOPAMAX) 50 MG PO Tablet TAKE 2 TABLETS BY MOUTH TWICE DAILY       REVIEW OF SYSTEMS: see neuro HPI . Constitution, eyes, ear, nose, mouth, throat, cardiovascular, gastrointestinal, genitourinary, hematologic, lymphatic, endocrine, musculoskeletal, and psychiatric systems were reviewed and were unremarkable, except for the aforementioned.      PHYSICAL EXAM:     Blood pressure 116/69, pulse 62, temperature 36.9 ?C (98.5 ?F), resp. rate 16, height 1.702 m (5\' 7" ), weight 81.6 kg (180 lb).     GENERAL APPEARANCE: Coherent, appropriately dressed. Able to provide a history.  CARDIAC: Heart: regular rate and rhythm, no bruits, no murmurs   NECK: S no carotid  or supraclavicular bruits auscualtated  LUNGS  clear to auscultation   NEUROLOGIC:  MENTAL STATUS: Alert. Oriented to person, place, and time  Intact recent

## 2017-02-03 NOTE — Progress Notes
Addendum: 02/03/19- spoke with patient whom continues to report severe migraines. Currently with headache 30/30 days per month with migraines at least 150-20 days er month. She has tried and failed TOPAMAX, PROPRANOLOL, GABAPENTIN, ZONEGRAN for preventative and has failed IBUPROFEN, FIORICET, TRAMADOL for abortive. She is a candidate for BOTOX given migraine frequency and intensity.   - orders placed for BOTOX  - Chart CC'd to [employer] for [employer]

## 2017-02-03 NOTE — Progress Notes
Calculated T Axis -34 degrees     *Note: Due to a large number of results and/or encounters for the requested time period, some results have not been displayed. A complete set of results can be found in Results Review.       [facility] records  Last visit march 30  SLE Dx 2010 , anti SSA, SSB + , borderline anti ds dna, diffuse arthralgia since 2007  FMS  Complex migraine  But in 2010 labs RF 25, SSA 118, SSB 26, Ds DNA 200 - overall mild disease  Ineffecive response to Pla   Rx SSZ 1000mg  bid (started 3/15)  Lack of response to tramadol  Gabapentin 600mg  tid  Pred short taper   MRI lumbar spine -NO sig   One episode of WBC 3.7       Radiology:    05/27/15  [facility]   Electrophysiologic findings:  1. Normal sural sensory conduction studies bilaterally.  2. Absent superficial peroneal sensory responses bilaterally. The symmetric absence responses a normal finding.  3. Reduced amplitude left tibial motor response with a normal distal latency. The right tibial motor conduction was normal.  4. Normal common peroneal motor conductions bilaterally.  5. Absent common peroneal F-wave responses bilaterally. The symmetric absence of responses is sometimes a normal finding and female patients.  6. Prolonged right tibial F-wave latency. The left tibial F-wave latency was normal.  7. Prolonged tibial H reflex responses bilaterally.  ?  Needle EMG: EMG in the left leg was normal in the tibialis anterior and gastrocnemius muscles.  ?  Impression: Electrophysiologic study was abnormal with findings that seem to be most consistent with mild lumbosacral radicular dysfunction as evidenced by the prolonged late responses, low amplitude distal motor response but normal sensory conductions. The absence of any EMG abnormality indicates that there is not significant motor axon loss associated. We did not find any evidence here to suggest that there is a large fiber peripheral neuropathy. Her symptoms do not seem to be

## 2017-02-03 NOTE — Progress Notes
Urinalysis W Microscopy ([facility])    Sjogrens syndrome    Relevant Medications    hydroxychloroquine (PLAQUENIL) 200 MG PO Tablet    predniSONE (DELTASONE) 5 MG PO Tablet    Other Relevant Orders    CBC and Differential (All [facility])    Comprehensive Metabolic Panel (All Labs)    C3 Complement (All Labs)    C4 Complement (All Labs)    Anti DNA, Double Stranded (All Labs)    Sedimentation Rate ESR (All Labs)    25-HYDROXY VITAMIN D    Urinalysis W Microscopy ([facility])    Fibromyalgia    Relevant Medications    cyclobenzaprine (FLEXERIL) 10 MG PO Tablet    traMADol (ULTRAM) 50 MG PO Tablet    Other Relevant Orders    CBC and Differential ([facility])    Comprehensive Metabolic Panel ([facility])    C3 Complement (All Labs)    C4 Complement (All Labs)    Anti DNA, Double Stranded (All Labs)    Sedimentation Rate ESR (All Labs)    25-HYDROXY VITAMIN D    Urinalysis W Microscopy ([facility])      Other Visit Diagnoses     Hypovitaminosis D        Relevant Orders    25-HYDROXY VITAMIN D          PLAN:     Counseling was given regarding impression.     Sjogren's /SLE: She has Pos ANA 1:1280 in 2012. Previously treated with PLa and SSZ at [facility] .  Now repeat labs consistent with  + SSA , negative SSB, mildly low C4 and low level pos anti Ds DNA   Negative RF and ant CCP  Pt counseled on the need for regular Dental Appointments every 6 months.   Patient was just seen by opthalmology for dry eye and plaquenil eye exam ( 01/2017)   Her DS DNA is higher , normal complements  And leucopenia in Jan labs which is better now   She also has generalized fatigue and severe sicca /.  Will restart Plaquenil   She is currently on prednisone by other provider  For short course  Follow up in 2 months with prior labs     # will get Vit D level       FMS: Reports poor sleep with trouble falling asleep and staying asleep as well as daytime fatigue.  Pt counseled on need for better sleep hygiene including setting a

## 2017-02-03 NOTE — Progress Notes
Social History:     Social History     Social History   ? Marital status: Single     Spouse name: N/A   ? Number of children: N/A   ? Years of education: N/A     Social History Main Topics   ? Smoking status: Never Smoker   ? Smokeless tobacco: Never Used   ? Alcohol use No   ? Drug use: No   ? Sexual activity: Not Currently     Other Topics Concern   ? None     Social History Narrative       Physical Exam:   BP 121/80 - Pulse 60 - Temp 36.3 ?C (97.4 ?F) - Resp 14 - Ht 1.702 m (5\' 7" ) - Wt 82.3 kg (181 lb 6.4 oz) - BMI 28.41 kg/m2      General appearance  alert, cooperative, no distress, appears stated age, obese    Psychiatric/Behavioral:  Negative for behavioral problems, confusion and agitation.          Head  Normocephalic, without obvious abnormality, no scalp lesion  normal hair distribution   Eyes  conjunctivae/corneas clear. PERTL, EOMI    HEENT Lips, mucosa very  dry  .no oral ulceration   Neck supple, symmetrical, trachea midline, no adenopathy,thyroid: not enlarged,  no JVD   Back   Normal posture, alignment  +  tenderness to palpation of spinous processes.? muscle spasm in neck and trapezius   ?   Lungs   Not tachypnea . Normal respiratory effort  clear to auscultation bilaterally       Heart  regular rate and rhythm, S1, S2 normal, no murmur, click, rub or gallop   Abdomen   soft, non-tender. Bowel sounds normal. No masses,  No organomegaly   MSK No objective  synovitis , dactylitis in complete exam.  FMS tender point 16/18*   Extremities extremities normal, atraumatic, no cyanosis or edema   Pulses 2+ and symmetric   Skin Skin color, texture, turgor normal. No rashes or lesions   Lymph nodes Cervical, supraclavicular, and axillary nodes normal.   Neurologic MENTAL STATUS: Alert. Oriented to person, place, and time. Intact recent and remote memory. No aphasia.   CRANIAL NERVES: Cranial nerves II-XII were normal   No motor /sensory deficit        Lab or Diagnostic Testing:

## 2017-02-03 NOTE — Progress Notes
Results for orders placed or performed during the hospital encounter of 25/85/27   Basic Metabolic Panel   Result Value Ref Range    Sodium 142 135 - 145 mmol/L    Potassium 3.5 3.3 - 4.6 mmol/L    Chloride 103 101 - 110 mmol/L    CO2 22 21 - 29 mmol/L    Urea Nitrogen 13 6 - 22 mg/dL    Creatinine 0.71 0.51 - 0.95 mg/dL    BUN/Creatinine Ratio 18.3 6.0 - 22.0 (calc)    Glucose 124 (H) 71 - 99 mg/dL    Calcium 10.0 8.6 - 10.0 mg/dL    Osmolality Calc 284.7     Anion Gap 17 (H) 4 - 16 mmol/L    EGFR >59 mL/min/1.73M2   CBC with Differential panel result   Result Value Ref Range    WBC 4.63 4.5 - 11 x10E3/uL    RBC 4.80 4.20 - 5.40 x10E6/uL    Hemoglobin 13.0 12.0 - 16.0 g/dL    Hematocrit 41.0 37.0 - 47.0 %    MCV 85.4 82.0 - 101.0 fl    MCH 27.1 27.0 - 34.0 pg    MCHC 31.7 31.0 - 36.0 g/dL    RDW 13.3 12.0 - 16.1 %    Platelet Count 231 140 - 440 thou/cu mm    MPV 9.8 9.5 - 11.5 fl    nRBC % 0.0 0.0 - 1.0 %    Absolute NRBC Count 0.00     Neutrophils % 67.0 34.0 - 73.0 %    Lymphocytes % 28.7 25.0 - 45.0 %    Monocytes % 3.7 2.0 - 6.0 %    Eosinophils % 0.0 (L) 1.0 - 4.0 %    Immature Granulocytes % 0.4 0.0 - 2.0 %    Neutrophils Absolute 3.10 1.80 - 8.70 x10E3/uL    Lymphocytes Absolute 1.33 x10E3/uL    Monocytes Absolute 0.17 x10E3/uL    Eosinophils Absolute 0.00 x10E3/uL    Basophil Absolute 0.01 x10E3/uL    Basophils % 0.2 0 - 1 %   Urinalysis W/Microscopy   Result Value Ref Range    Color -Ur Equities trader, UA Clear Hazy    Specific Gravity, Urine 1.005 1.003 - 1.030    pH, Urine 8.0 4.5 - 8.0    Protein-UA Negative Negative mg/dL    Glucose -Ur Negative Negative mg/dL    Ketones UA Negative Negative mg/dL    Bilirubin -Ur Negative Negative    Blood -Ur Negative Negative    Nitrite -Ur Negative Negative    Urobilinogen -Ur Normal Normal    Leukocytes -Ur Negative Negative    RBC -Ur 3 0 - 5 /HPF    WBC -Ur <1 0 - 5 /HPF    Bacteria -Ur None seen None seen /HPF    Mucus -Ur Rare 2+ /LPF
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
## 2017-02-03 NOTE — Progress Notes
scheduled bedtime, no day time napping, appropriate bedroom environment as well scheduled exercise program ( a least 30 mins of activity daily)  Currently on gabapentin and Zoloft, flexeril   Will add tramadol prn       Clinically stable           Myint Loni Beckwith, MD

## 2017-02-03 NOTE — Progress Notes
and remote memory.  Intact attention span and concentration. Intact language and speech. Intact fund of knowledge.  CRANIAL NERVES: PERRTL .Full EOMs. Attempted fundoscopic examination however unable to adequately visualize fundus . Intact facial sensation and strength.  Grossly normal hearing. Intact spontaneous palatal elevation. Tongue midline.  No APD appreciated   MOTOR: Normal bulk and tone. 5/5 strength throughout all limbs.   SENSORY:  Intact light touch, pinprick, and temperature in all limbs.  REFLEXES: 2  bicep/tricep/BR bilaterally 2 knee/ankle bilaterally.   COORDINATION: Intact finger to nose and heel to shin maneuver in arms and legs.  GAIT: normal       DIAGNOSTIC STUDIES REVIEWED:  Office Visit on 01/15/2017   Component Date Value   ? CLINICAL INFORMATION: 01/15/2017 None given    ? LMP: 01/15/2017 None given    ? PREV. PAP: 01/15/2017 None given    ? PREV. BX: 01/15/2017 None given    ? SOURCE: 01/15/2017 Cervix    ? STATEMENT OF ADEQUACY: 01/15/2017 Satisfactory for evaluation. Endocervical/transformation zone component present.    ? INTERPRETATION/RESULT: 01/15/2017 Negative for intraepithelial lesion or malignancy.    ? COMMENT: 01/15/2017 This Pap test has been evaluated with computer assisted technology.    ? CYTOTECHNOLOGIST: 01/15/2017 NXB, CT(ASCP) CT screening location: [facility] [address] [place] [address]    ? HPV E6+E7 mRNA Cervix Ql* 01/15/2017 Not Detected    ? Colonoscopy Comment 06/14/2013 [facility]    ? Fecal Occult Bld Spec #1 01/15/2017 negative    Office Visit on 12/24/2016   Component Date Value   ? Color -Ur 12/25/2016 DARK YELLOW    ? APPEARANCE 12/25/2016 CLEAR    ? Specific Gravity -Ur 12/25/2016 1.015    ? pH -Ur 12/25/2016 7.5    ? Glucose -Ur 12/25/2016 NEGATIVE    ? Bilirubin -Ur 12/25/2016 NEGATIVE    ? Ketones UA 12/25/2016 NEGATIVE    ? Hb -Ur 12/25/2016 NEGATIVE    ? Protein-UA 12/25/2016 NEGATIVE    ? NITRITE 12/25/2016 NEGATIVE

## 2017-02-03 NOTE — Progress Notes
Addendum: 02/03/19- spoke with patient whom continues to report severe migraines. PATIENT WITH CHRONIC MIGRAINE. Currently with headache 30/30 days per month with migraines at least 150-20 days er month. She has tried and failed TOPAMAX, PROPRANOLOL, GABAPENTIN, ZONEGRAN for preventative and has failed IBUPROFEN, FIORICET, TRAMADOL for abortive. She is a candidate for BOTOX given migraine frequency and intensity.   - orders placed for BOTOX  - Chart CC'd to [employer] for [employer]

## 2017-02-03 NOTE — Progress Notes
?   Atrial fib/flutter, transient    ? Blood clot in vein    ? CAD (coronary artery disease)    ? COPD (chronic obstructive pulmonary disease)    ? Hyperlipemia    ? Hypertension    ? Lupus    ? Occasional tremors    ? Osteoarthritis    ? Pulmonary embolism    ? Sjogren's syndrome    ? TIA (transient ischemic attack)         Past Surgical History:   Procedure Laterality Date   ? CHOLECYSTECTOMY      C-Section   ? CHOLECYSTECTOMY      Surgery Description: Cholecystectomy;   (Created by Conversion)       Family History   Problem Relation Age of Onset   ? Asthma Mother    ? Heart Disease Mother    ? Hypertension Mother    ? Stroke Mother    ? Vision Loss Mother    ? Asthma Brother    ? Birth Defects Brother    ? Early Death Brother    ? Heart Disease Brother    ? High Cholesterol Brother    ? Hypertension Brother    ? Asthma Daughter    ? High Cholesterol Daughter    ? Hypertension Daughter    ? Asthma Maternal Aunt    ? Heart Disease Maternal Aunt    ? Hypertension Maternal Aunt    ? Miscarriages / Stillbirths Maternal Aunt    ? Stroke Maternal Aunt    ? Vision Loss Maternal Aunt    ? Heart Disease Maternal Uncle    ? Hypertension Maternal Uncle    ? Stroke Maternal Uncle    ? Arthritis Maternal Grandmother    ? Asthma Maternal Grandmother    ? Clotting Disorder Maternal Grandmother    ? Heart Disease Maternal Grandmother    ? Hypertension Maternal Grandmother    ? Miscarriages / Stillbirths Maternal Grandmother    ? Vision Loss Maternal Grandmother        Social History     Social History   ? Marital status: Single     Spouse name: N/A   ? Number of children: N/A   ? Years of education: N/A     Occupational History   ? Not on file.     Social History Main Topics   ? Smoking status: Never Smoker   ? Smokeless tobacco: Never Used   ? Alcohol use No   ? Drug use: No   ? Sexual activity: Not Currently     Other Topics Concern   ? Not on file     Social History Narrative          Allergies   Allergen Reactions

## 2017-02-03 NOTE — Progress Notes
Department of Rheumatology    Patient: Nichole Colon  MRN: [id number]  DOB: [birth year]        Chief Complaint:  Follow-up (6 Weeks)    Patient Active Problem List   Diagnosis   ? Plantar fascial fibromatosis   ? Systemic lupus erythematosus   ? Sicca syndrome   ? Osteoarthrosis involving lower leg   ? Other disorder of menstruation and other abnormal bleeding from female genital tract   ? Unspecified transient cerebral ischemia   ? Other chronic pain   ? Other malaise and fatigue   ? Routine general medical examination at a health care facility   ? Allergy, unspecified not elsewhere classified   ? Chronic airway obstruction, not elsewhere classified   ? Essential hypertension   ? Gonococcal infection (acute) of lower genitourinary tract   ? Other and unspecified hyperlipidemia   ? Pain in limb   ? Sjogrens syndrome   ? Fibromyalgia   ? Migraine without aura and without status migrainosus, not intractable   ? Chronic bilateral low back pain without sciatica   ? OAB (overactive bladder)   ? Body mass index 28.0-28.9, adult   ? Medicare annual wellness visit, subsequent   ? Screening mammogram, encounter for   ? Screening for malignant neoplasm of cervix   ? Chest pain       History of Present Illness:     Nichole Colon is an 51 y.o. AA female who is here for   Sjogren's  Disease and fibromyalgia .    Past history   She has been following  with Dr Barrie Folk for several years and established Dx of lupus/Sjogren's, Fibromyalgia and OA around 2010  .   Her last visit  With him was 03/14/14. Her insurance changed   She was treated with Plaquenil then switched to SSZ . She was on SSZ 2 months and stopped because of nausea . Plq also gave her dry skin and it was stopped .  She was not on any med in her first visit 03/06/15  except for FMS.  Neurontin, baclofen and mobic prn   I also received notes from [facility]   They Dx SLE (2010) anti SSA, SSB  + , borderline Ds DNA , negative APL study

## 2017-02-03 NOTE — Progress Notes
Medication Sig Start Date End Date Taking? Authorizing Provider   butalbital-acetaminophen-caffeine (FIORICET, ESGIC) 50-325-40 MG PO Tablet Take 1-2 tabs at headache onset. May repeat 1 tab in 6 hours if needed. Do not treat more then 2 headaches per week. 01/21/17  Yes [name], ARNP   cycloSPORINE (RESTASIS) 0.05 % OP Emulsion Place 1 drop into both eyes every 12 hours. 01/13/17  Yes Liam Rogers, MD   dilTIAZem (DILT-XR) 180 MG PO Capsule Extended Release 24 Hour TAKE 1 CAPSULE BY MOUTH DAILY 01/15/17  Yes Gretchen Short, MD   ergocalciferol (vitamin D-2) 50000 units PO Capsule TAKE 1 CAPSULE BY MOUTH 1 TIME A WEEK ON AN EMPTY STOMACH 01/15/17  Yes Gretchen Short, MD   gabapentin (NEURONTIN) 600 MG Tablet TAKE 1 TABLET BY MOUTH THREE TIMES DAILY 02/12/16  Yes Gretchen Short, MD   hydroCHLOROthiazide (HYDRODIURIL) 25 MG PO Tablet TAKE 1 TABLET BY MOUTH DAILY 01/15/17  Yes Gretchen Short, MD   hydroxychloroquine (PLAQUENIL) 200 MG Tablet Take 1 tablet by mouth 2 times daily. 01/31/16  Yes Thway, Myint Vic Blackbird, MD   meclizine (ANTIVERT) 25 MG PO Tablet Take 1 tablet by mouth 3 times daily as needed. 01/15/17  Yes Gretchen Short, MD   pilocarpine (SALAGEN) 5 MG Tablet Take 1 tablet by mouth 2 times daily. 02/20/16  Yes Thway, Myint Vic Blackbird, MD   predniSONE (DELTASONE) 20 MG PO Tablet Take 3 tabs daily x 4 days then take 2 tabs daily x 3 days then take 1 tab daily x 2 days and STOP 01/21/17  Yes [name], ARNP   sertraline (ZOLOFT) 50 MG PO Tablet TAKE 1 TABLET BY MOUTH EVERY DAY 01/15/17  Yes Gretchen Short, MD   solifenacin (VESICARE) 5 MG PO Tablet Take 1 tablet by mouth daily. 01/15/17  Yes Gretchen Short, MD   topiramate (TOPAMAX) 50 MG PO Tablet Take by mouth 2 times daily.   Yes Information, Historical   zonisamide (ZONEGRAN) 100 MG PO Capsule Take 1 caps nightly for 1 week then take 2 caps nightly for headache prevention 01/21/17  Yes [name], [place]

## 2017-02-03 NOTE — Progress Notes
ASCORBIC ACID Negative 20 mg/dL   Troponin T   Result Value Ref Range    Troponin T <0.01 0.00 - 0.04 ng/ml   Troponin T   Result Value Ref Range    Troponin T <0.01 0.00 - 0.04 ng/ml   POCT Urinalysis w/o Microscopy auto   Result Value Ref Range    Color -Ur Yellow     Clarity, UA Clear     Spec Grav 1.010 1.003 - 1.030    pH 7.0 (L) 7.4 - 7.4    Urobilinogen -Ur 0.2 <=2.0 E.U./dL    Nitrite -Ur Negative Negative    Protein-UA Negative Negative mg/dL    Glucose -Ur Negative Negative mg/dL    Blood, UA Trace (A) Negative    WBC, UA Negative Negative    Bilirubin -Ur Negative Negative    Ketones -UR Negative Negative   POCT Urinalysis w/o Microscopy auto   Result Value Ref Range    Color -Ur Yellow     Clarity, UA Clear     Spec Grav 1.015 1.003 - 1.030    pH 8.0 (H) 7.4 - 7.4    Urobilinogen -Ur 0.2 <=2.0 E.U./dL    Nitrite -Ur Negative Negative    Protein-UA Negative Negative mg/dL    Glucose -Ur Negative Negative mg/dL    Blood, UA Negative Negative    WBC, UA Trace (A) Negative    Bilirubin -Ur Negative Negative    Ketones -UR Negative Negative   ECG - Performed by ED Staff   Result Value Ref Range    Ventricular Rate 63 BPM    Atrial Rate 63 BPM    P-R Interval 164 ms    QRS Duration 88 ms    Q-T Interval 412 ms    QTC Calculation (Bezet) 421 ms    Calculated P Axis 49 degrees    Calculated R Axis -5 degrees    Calculated T Axis -58 degrees   EKG - Adult (autopage)   Result Value Ref Range    Ventricular Rate 59 BPM    Atrial Rate 59 BPM    P-R Interval 156 ms    QRS Duration 90 ms    Q-T Interval 422 ms    QTC Calculation (Bezet) 417 ms    Calculated P Axis 62 degrees    Calculated R Axis 5 degrees    Calculated T Axis -47 degrees   ECG - Adult   Result Value Ref Range    Ventricular Rate 60 BPM    Atrial Rate 60 BPM    P-R Interval 140 ms    QRS Duration 90 ms    Q-T Interval 400 ms    QTC Calculation (Bezet) 400 ms    Calculated P Axis 61 degrees    Calculated R Axis 13 degrees

## 2017-02-03 NOTE — Progress Notes
prednisoLONE acetate (PRED FORTE) 1 % OP Suspension Place 1 drop into both eyes 4 times daily for 14 days. 01/14/17 01/28/17  Verdene Lennert, MD       Allergies:  Allergies   Allergen Reactions   ? No Known Drug Allergy      No Known Drug Allergies       PMH:   Past Medical History:   Diagnosis Date   ? Atrial fib/flutter, transient    ? Blood clot in vein    ? CAD (coronary artery disease)    ? Cerebral artery occlusion with cerebral infarction    ? COPD (chronic obstructive pulmonary disease)    ? Hyperlipemia    ? Hypertension    ? Lupus    ? Occasional tremors    ? Osteoarthritis    ? Personal history of noncompliance with medical treatment, presenting hazards to health    ? Pulmonary embolism    ? Sjogren's syndrome    ? TIA (transient ischemic attack)        PSH:  Past Surgical History:   Procedure Laterality Date   ? CHOLECYSTECTOMY      C-Section   ? CHOLECYSTECTOMY      Surgery Description: Cholecystectomy;   (Created by Conversion)       Family History:     Family History   Problem Relation Age of Onset   ? Asthma Mother    ? Heart Disease Mother    ? Hypertension Mother    ? Stroke Mother    ? Vision Loss Mother    ? Asthma Brother    ? Birth Defects Brother    ? Early Death Brother    ? Heart Disease Brother    ? High Cholesterol Brother    ? Hypertension Brother    ? Asthma Daughter    ? High Cholesterol Daughter    ? Hypertension Daughter    ? Asthma Maternal Aunt    ? Heart Disease Maternal Aunt    ? Hypertension Maternal Aunt    ? Miscarriages / Stillbirths Maternal Aunt    ? Stroke Maternal Aunt    ? Vision Loss Maternal Aunt    ? Heart Disease Maternal Uncle    ? Hypertension Maternal Uncle    ? Stroke Maternal Uncle    ? Arthritis Maternal Grandmother    ? Asthma Maternal Grandmother    ? Clotting Disorder Maternal Grandmother    ? Heart Disease Maternal Grandmother    ? Hypertension Maternal Grandmother    ? Miscarriages / Stillbirths Maternal Grandmother    ? Vision Loss Maternal Grandmother

## 2017-02-03 NOTE — Progress Notes
for abortive therapy for Migraine.   -Had recent migraine x 6 days- bloody nose, was unable to function, nausea and vomiting. + light and noise sensitivity.   Say Dr. Paulla Fore ( rheumatology) recommended that she decrease the amount of MOBIC. She was also placed on Prednisone 5mg  daily for her Lupus.   - notes that Sjogrens and Lupus is acting up- just placed on PLAQUINEL, PILOCARPINE- she was also referred to opthalmology for dry eyes, notes problems swallowing.   - she notes migraines during the month however more severe during her menses- will get a migraine 1-2 days before onset menses and 1-2 days after menses . Does get severe nausea with her headaches   - now on TOPAMAX 100mg  BID. Reports that the TOPAMAX has helped reduce the intensity and duration of the headaches however continues to have frequent headaches ( has been on this dose now x 1 months)   - she notes problems sleeping. Some nights she falls asleep however is unable to stay asleep. When she wakes up she is unable to fall back to sleep. Averages 4 hours per night. Feels exhausted all the time. Notes daytime fatigue. Sometimes she will fall asleep mid day. Recently she was told she snored.     Currently: Was last seen in April 2016--> November/December 2017 almost daily headaches. Migraines last days to a week at a time. Migraines around menses has stopped. Patient is having 25+ days with a headache. With the severe headaches she has nausea and vomitting. Swallowing even  Makes her sick. She has anorexia with the severe migraines. Patient continues to have sleep issues. She is not on a good sleep cycle. She has daytime fatigue. She reports snoring. She will fall asleep mid-day. Continues on TOPAMAX 100mg  BID. Not sure whether its helping. For abortive, does not take anything. Lays in a dark room.she feels like the LUPUS is not well controlled which is contributing to her headaches.     Past Medical History:   Diagnosis Date

## 2017-02-03 NOTE — Progress Notes
consistent with a small fiber neuropathy    Eye exam (01/2017)     Glaucoma suspect, bilateral  - (+) family history: mother, mGm, mAunt   - African American   - HVF 24-2 01/2017: possible bilateral nasal defects with rim artifact OS  - IOP has been wnl, continue to observe for now      Sjogren's syndrome with keratoconjunctivitis sicca  - continue follow up with rheumatology  - recommend artificial tears QID OU  - discussed use of preservative free artificial tears if using more frequently   - discussed minimizing environmental factors    ?  Long-term use of Plaquenil  - currently off plaquenil   - no evidence of maculopathy at this time   - patient currently on 400 mg Plaquenil x 4-5 years  - real body weight: 81.6 kg and daily dose 4.9 mg/kg  - Patient's major risk factors for toxicity:                         - >5.0 mg/kg real weight: no                        - >5 years of use: almost, 5 years per history                        - concurrent renal disease: no                        - concurrent tamoxifen use: no                        - pre-existing macular disease: no  - Screening                        - Baseline HVF (10-2 white SITA): needs to be performed                        - Baseline SD OCT 12/2016: wnl OU                         - recommend annual screening with repeat testing after 5 years of exposure  - discussed risk of toxicity and possible signs/symptoms with patient  - continue per primary/rheumatology    ASSESSMENT:     Problem List Items Addressed This Visit     Systemic lupus erythematosus    Relevant Medications    hydroxychloroquine (PLAQUENIL) 200 MG PO Tablet    predniSONE (DELTASONE) 5 MG PO Tablet    Other Relevant Orders    CBC and Differential (All Labs)    Comprehensive Metabolic Panel (All Labs)    C3 Complement (All Labs)    C4 Complement (All Labs)    Anti DNA, Double Stranded (All Labs)    Sedimentation Rate ESR (All Labs)    25-HYDROXY VITAMIN D

## 2017-02-03 NOTE — Progress Notes
?   Leukocyte Esterase -Ur 12/25/2016 NEGATIVE    ? WBC -Ur 12/25/2016 NONE SEEN    ? RBC -Ur 12/25/2016 NONE SEEN    ? Squam Epithel, UA 12/25/2016 NONE SEEN    ? BACTERIA 12/25/2016 NONE SEEN    ? Hyaline Casts, UA 12/25/2016 NONE SEEN        No results found.         ASSESSMENT :  H/o common migraines with accleration in pattern. Currently on TOPAMAX 100mg  BID however currently having headaches 25+ days per month headaches.   Difficulty sleeping with + STOP BANG FORM in office    PLAN:     - refer to neurology sleep specialist for + STOP BANG  - Reports h/o coronary artery spasm- will avoid triptans- given medical history recommended FIORICET. Of note patient has tried and failed Naproxen, Reglan, Tramadol, Ibuprofen. CAN NOT TAKE ANY TRIPTANS DUE TO CARDIAC HISTORY AND H/O CORONARY ARTERY SPASMS  - PREDNISONE TAPER TO ABORT CURRENT HEADACHE  - WHILE ON PREDNISONE WEAN OFF TOPAMAX   - ONCE OFF TOPAMAX START ZONISAMIDE 100MG  NIGHTLY X 1 WEEK THEN INCREASE TO 200MG  NIGHTLY   - IF THIS DOES NOT HELP, PATIENT IS A GOOD CANDIDATE FOR BOTOX  - RECOMMENDED TO KEEP HEADACHE CALENDER AND FOLLOW UP 8 WEEKS     Nichole Colon was seen today for follow-up.    Diagnoses and all orders for this visit:    Migraine without aura and without status migrainosus, not intractable  -     Refer to Neurology  -     predniSONE (DELTASONE) 20 MG PO Tablet; Take 3 tabs daily x 4 days then take 2 tabs daily x 3 days then take 1 tab daily x 2 days and STOP  -     zonisamide (ZONEGRAN) 100 MG PO Capsule; Take 1 caps nightly for 1 week then take 2 caps nightly for headache prevention  -     butalbital-acetaminophen-caffeine (FIORICET, ESGIC) 50-325-40 MG PO Tablet; Take 1-2 tabs at headache onset. May repeat 1 tab in 6 hours if needed. Do not treat more then 2 headaches per week.    Sleep apnea, unspecified type  -     Refer to Neurology; Future          [name], ARNP  Department of Neurology

## 2017-02-08 NOTE — ED Attestation Note
Attestation   I saw and evaluated the patient. I reviewed and agree with the findings and plan as documented in the note. I reviewed the patient's history. I have made corrections and additions as appropriate. I discussed this patient with the resident/fellow.   Comments: Hx lupus and Sjograns. Presents for typical CP/cards Sx. (Hx coro spasms)                   Nichole Rich, MD  01/25/17 0246       Nichole Rich, MD  02/07/17 [phone]

## 2017-02-08 NOTE — ED Attestation Note
Attestation   I discussed this patient with the resident/fellow.   Comments: Hx lupus and Sjograns. Presents for typical CP/cards Sx. (Hx coro spasms)                   Achille Rich, MD  01/25/17 [phone]

## 2017-02-10 ENCOUNTER — Ambulatory Visit: Attending: Internal Medicine | Primary: Internal Medicine

## 2017-02-10 DIAGNOSIS — M3500 Sicca syndrome, unspecified: Secondary | ICD-10-CM

## 2017-02-10 DIAGNOSIS — G459 Transient cerebral ischemic attack, unspecified: Secondary | ICD-10-CM

## 2017-02-10 DIAGNOSIS — I6350 Cerebral infarction due to unspecified occlusion or stenosis of unspecified cerebral artery: Secondary | ICD-10-CM

## 2017-02-10 DIAGNOSIS — IMO0002 Atrial fib/flutter, transient: Secondary | ICD-10-CM

## 2017-02-10 DIAGNOSIS — Z9119 Patient's noncompliance with other medical treatment and regimen: Secondary | ICD-10-CM

## 2017-02-10 DIAGNOSIS — I10 Essential (primary) hypertension: Principal | ICD-10-CM

## 2017-02-10 DIAGNOSIS — H539 Unspecified visual disturbance: Secondary | ICD-10-CM

## 2017-02-10 DIAGNOSIS — E785 Hyperlipidemia, unspecified: Secondary | ICD-10-CM

## 2017-02-10 DIAGNOSIS — Z6829 Body mass index (BMI) 29.0-29.9, adult: Principal | ICD-10-CM

## 2017-02-10 DIAGNOSIS — J449 Chronic obstructive pulmonary disease, unspecified: Secondary | ICD-10-CM

## 2017-02-10 DIAGNOSIS — Z79899 Other long term (current) drug therapy: Secondary | ICD-10-CM

## 2017-02-10 DIAGNOSIS — M1990 Unspecified osteoarthritis, unspecified site: Secondary | ICD-10-CM

## 2017-02-10 DIAGNOSIS — I2699 Other pulmonary embolism without acute cor pulmonale: Secondary | ICD-10-CM

## 2017-02-10 DIAGNOSIS — R0789 Other chest pain: Secondary | ICD-10-CM

## 2017-02-10 DIAGNOSIS — R1310 Dysphagia, unspecified: Secondary | ICD-10-CM

## 2017-02-10 DIAGNOSIS — R251 Tremor, unspecified: Secondary | ICD-10-CM

## 2017-02-10 DIAGNOSIS — I8290 Acute embolism and thrombosis of unspecified vein: Secondary | ICD-10-CM

## 2017-02-10 DIAGNOSIS — M329 Systemic lupus erythematosus, unspecified: Secondary | ICD-10-CM

## 2017-02-10 DIAGNOSIS — I2510 Atherosclerotic heart disease of native coronary artery without angina pectoris: Secondary | ICD-10-CM

## 2017-02-10 NOTE — Progress Notes
Assessment:       ICD-10-CM ICD-9-CM    1. Other chest pain R07.89 786.59 Refer to Gastroenterology   2. BMI 29.0-29.9,adult Z68.29 V85.25    3. Sjogren's syndrome, with unspecified organ involvement M35.00 710.2 Refer to Gastroenterology      Refer to Ophthalmology   4. Esophageal dysphagia R13.10 787.20 Refer to Gastroenterology   5. Long-term use of Plaquenil Z79.899 V58.69 Refer to Ophthalmology   6. Visual disturbance H53.9 368.9 Refer to Ophthalmology     Chest pain clearly not related to CAD  Cardaic eval extensive negative  PE excluded by CT scan  Chest xray normal , no pulmonary sympotmatology    Suspect hx of CTD /Sjorgens/lupus  Strongly SUSPECT esophageal in origein ?stricture  Obtain records from [facility] from previous GI workup approx 3 years ago     Plan:     No orders of the following type(s) were placed in this encounter: Medications.     Orders Placed This Encounter   Procedures   ? Refer to Gastroenterology   ? Refer to Ophthalmology       Health Maintenance was reviewed. The patient's HM Topic list was:                                            Health Maintenance   Topic Date Due   ? Breast Cancer Screening  02/12/2017 (Originally 03/28/2016)   ? Lipid Profile  02/18/2017   ? Preventive Wellness Visit  01/15/2018   ? Basic Metabolic Panel  [id number]   ? Pap Smear  01/16/2020   ? Zoster Vaccine (1) 03/28/2026   ? Colon Cancer Screening  01/15/2027   ? USPSTF HIV Risk Assessment  Completed   ? Influenza Vaccine  Addressed   ? DTaP,Tdap,and Td Vaccines  Excluded   ? Pneumovax / Prevnar  Excluded

## 2017-02-10 NOTE — Progress Notes
They Dx SLE (2010) anti SSA, SSB  + , borderline Ds DNA , negative APL study   She was restarted PLq and salagen (03/30/2015)  .  Sjogren's /SLE: She has Pos ANA 1:1280 in 2012. Previously treated with PLa and SSZ at [facility] .  Now repeat labs consistent with  + SSA , negative SSB, mildly low C4 and low level pos anti Ds DNA   Negative RF and ant CCP  Pt counseled on the need for regular Dental Appointments every 6 months.   Patient was just seen by opthalmology for dry eye and plaquenil eye exam ( 01/2017)   Her DS DNA is higher , normal complements  And leucopenia in Jan labs which is better now   She also has generalized fatigue and severe sicca /.  Will restart Plaquenil   She is currently on prednisone by other provider  For short course  Follow up in 2 months with prior labs   ?  # will get Vit D level   ?  ?  FMS: Reports poor sleep with trouble falling asleep and staying asleep as well as daytime fatigue.  Pt counseled on need for better sleep hygiene including setting a scheduled bedtime, no day time napping, appropriate bedroom environment as well scheduled exercise program ( a least 30 mins of activity daily)  Currently on gabapentin and Zoloft, flexeril   Will add tramadol prn     Since discharge, continue to have paincentral location  No obvious ppt factors, not related to meals or activity  No fever, cough,  No releiving factors , including position, meds    Past Medical History:   Diagnosis Date   ? Atrial fib/flutter, transient    ? Blood clot in vein    ? CAD (coronary artery disease)    ? Cerebral artery occlusion with cerebral infarction    ? COPD (chronic obstructive pulmonary disease)    ? Hyperlipemia    ? Hypertension    ? Lupus    ? Occasional tremors    ? Osteoarthritis    ? Personal history of noncompliance with medical treatment, presenting hazards to health    ? Pulmonary embolism    ? Sjogren's syndrome    ? TIA (transient ischemic attack)      Past Surgical History:

## 2017-02-10 NOTE — Progress Notes
?   cycloSPORINE (RESTASIS) 0.05 % OP Emulsion Place 1 drop into both eyes every 12 hours.   ? dilTIAZem (DILT-XR) 180 MG PO Capsule Extended Release 24 Hour TAKE 1 CAPSULE BY MOUTH DAILY   ? ergocalciferol (vitamin D-2) 50000 units PO Capsule TAKE 1 CAPSULE BY MOUTH 1 TIME A WEEK ON AN EMPTY STOMACH   ? gabapentin (NEURONTIN) 600 MG Tablet TAKE 1 TABLET BY MOUTH THREE TIMES DAILY   ? hydroCHLOROthiazide (HYDRODIURIL) 25 MG PO Tablet TAKE 1 TABLET BY MOUTH DAILY   ? hydroxychloroquine (PLAQUENIL) 200 MG PO Tablet Take 1 tablet by mouth 2 times daily.   ? pilocarpine (SALAGEN) 5 MG Tablet Take 1 tablet by mouth 2 times daily.   ? predniSONE (DELTASONE) 5 MG PO Tablet Take 1 tablet by mouth daily.   ? sertraline (ZOLOFT) 50 MG PO Tablet TAKE 1 TABLET BY MOUTH EVERY DAY   ? solifenacin (VESICARE) 5 MG PO Tablet Take 1 tablet by mouth daily.   ? traMADol (ULTRAM) 50 MG PO Tablet Take 1 tablet by mouth every 8 hours as needed for pain.   ? [DISCONTINUED] meclizine (ANTIVERT) 25 MG PO Tablet Take 1 tablet by mouth 3 times daily as needed.   ? prednisoLONE acetate (PRED FORTE) 1 % OP Suspension Place 1 drop into both eyes 4 times daily for 14 days.   ? [DISCONTINUED] predniSONE (DELTASONE) 20 MG PO Tablet Take 3 tabs daily x 4 days then take 2 tabs daily x 3 days then take 1 tab daily x 2 days and STOP   ? [DISCONTINUED] topiramate (TOPAMAX) 50 MG PO Tablet Take by mouth 2 times daily.   ? [DISCONTINUED] zonisamide (ZONEGRAN) 100 MG PO Capsule Take 1 caps nightly for 1 week then take 2 caps nightly for headache prevention     No current facility-administered medications on file prior to visit.      Allergies   Allergen Reactions   ? No Known Drug Allergy      No Known Drug Allergies         Review of Systems  Review of Systems   Constitutional: Negative for activity change, appetite change, fatigue and unexpected weight change.   HENT: Positive for trouble swallowing (central chest, feel like mid

## 2017-02-10 NOTE — Progress Notes
sternal. feels like food get lodged there).    Respiratory: Positive for chest tightness. Negative for shortness of breath.    Cardiovascular: Positive for chest pain. Negative for palpitations and leg swelling.   Gastrointestinal: Positive for nausea. Negative for abdominal distention, abdominal pain, anal bleeding, blood in stool, constipation and diarrhea.           Objective:        VITAL SIGNS (all recorded)      Clinic Vitals       02/10/17 1124             Amb Encounter Vitals    Weight 85.3 kg (188 lb)    -CT at 02/10/17 1124       Height 1.702 m (5\' 7" )    -CT at 02/10/17 1124       BMI (Calculated) 29.51    -CT at 02/10/17 1124       BSA (Calculated - sq m) 2.01    -CT at 02/10/17 1124       BP 128/74    -CT at 02/10/17 1124       Pulse 70    -CT at 02/10/17 1124       Resp 18    -CT at 02/10/17 1124       O2 Saturation 98 %    -CT at 02/10/17 1124       Education/Communication Barriers?    Learning/Communication Barriers? No    -CT at 02/10/17 1124       Fall Risk Assessment    Had recent fall / Last 6 months? No recent fall    -CT at 02/10/17 1124       Does patient have a fear of falling? No    -CT at 02/10/17 1124         User Key  (r) = Recorded By, (t) = Taken By, (c) = Cosigned By    [place] Name Effective Dates    CT Floydene Flock, [place] 06/12/16 -         Physical Exam   Constitutional: She is oriented to person, place, and time. No distress.   HENT:   Head: Atraumatic.   Neck: Normal range of motion. Neck supple. No JVD present. No thyromegaly present.   Cardiovascular: Normal rate and regular rhythm.    Pulmonary/Chest: Effort normal. No respiratory distress. She has no wheezes. She exhibits no tenderness.   Abdominal: Soft. Bowel sounds are normal. She exhibits no distension. There is no tenderness.   Neurological: She is alert and oriented to person, place, and time.   Psychiatric: She has a normal mood and affect. Her behavior is normal.   Nursing note and vitals reviewed.

## 2017-02-10 NOTE — Progress Notes
Subjective:   Nichole Colon is a 51 y.o. female being seen today for ER Follow Up       HPI  Post hospital f/u 01/25/17 [facility]   Admitted from ER  For chest pain  Acute onset  Left sided radiating to jaw    Hx of cornary spasm    Chnm porfile/cbc normal    Chest xray neg    Ekg unchanged    Assessment and Plan:   ? Atypical chest pain with h./o SLE : EKG reviewed by me TWI In anterolateral leads , No change from prior . Trend CE . Check ECHO and treadmill stress test . Pt says that she had Treadmill stress test 7 months ago here at [facility] ? , last stress test at [facility] 2012 per Epic records   ?  ? Headache / Migraine d/o seen by Neuro , No Triptan due to coronary spasm h/o , Now on Topamax taper , Prednisone taper for HA  And start on Zonegran once off Topamax . See Neuro Note 01/21/2017 for further details . If any acute HA in [place] , order Fioricet as recommended by Neuro .  Dont treat no more than 2 HA per week with Fioricet   ? SLE / Sjogrens , follows with Rheum as OP , cont Pilocarpine , Plaquenil stopped due to no follow up [place] [facility] since 6/16   ? HTN cont Home medications   ? 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 Neuropathy cont Gabapentin     Echo was done    Nuclear stress test 2/12 reprotedly normal    CT neg for PE      Saw rheumatoloigst after discharge as follows    Nichole Colon is an 51 y.o. AA female who is here for   Sjogren's  Disease and fibromyalgia .  ?  Past history   She has been following  with Dr Barrie Folk for several years and established Dx of lupus/Sjogren's, Fibromyalgia and OA around 2010  .   Her last visit  With him was 03/14/14. Her insurance changed   She was treated with Plaquenil then switched to SSZ . She was on SSZ 2 months and stopped because of nausea . Plq also gave her dry skin and it was stopped .  She was not on any med in her first visit 03/06/15  except for FMS.  Neurontin, baclofen and mobic prn   I also received notes from [facility]

## 2017-02-10 NOTE — Progress Notes
Procedure Laterality Date   ? CHOLECYSTECTOMY      C-Section   ? CHOLECYSTECTOMY      Surgery Description: Cholecystectomy;   (Created by Conversion)     Family History   Problem Relation Age of Onset   ? Asthma Mother    ? Heart Disease Mother    ? Hypertension Mother    ? Stroke Mother    ? Vision Loss Mother    ? Asthma Brother    ? Birth Defects Brother    ? Early Death Brother    ? Heart Disease Brother    ? High Cholesterol Brother    ? Hypertension Brother    ? Asthma Daughter    ? High Cholesterol Daughter    ? Hypertension Daughter    ? Asthma Maternal Aunt    ? Heart Disease Maternal Aunt    ? Hypertension Maternal Aunt    ? Miscarriages / Stillbirths Maternal Aunt    ? Stroke Maternal Aunt    ? Vision Loss Maternal Aunt    ? Heart Disease Maternal Uncle    ? Hypertension Maternal Uncle    ? Stroke Maternal Uncle    ? Arthritis Maternal Grandmother    ? Asthma Maternal Grandmother    ? Clotting Disorder Maternal Grandmother    ? Heart Disease Maternal Grandmother    ? Hypertension Maternal Grandmother    ? Miscarriages / Stillbirths Maternal Grandmother    ? Vision Loss Maternal Grandmother      Social History     Social History   ? Marital status: Single     Spouse name: N/A   ? Number of children: N/A   ? Years of education: N/A     Occupational History   ? Not on file.     Social History Main Topics   ? Smoking status: Never Smoker   ? Smokeless tobacco: Never Used   ? Alcohol use No   ? Drug use: No   ? Sexual activity: Not Currently     Other Topics Concern   ? Not on file     Social History Narrative     Current Outpatient Prescriptions on File Prior to Visit   Medication Sig   ? butalbital-acetaminophen-caffeine (FIORICET, ESGIC) 50-325-40 MG PO Tablet Take 1-2 tabs at headache onset. May repeat 1 tab in 6 hours if needed. Do not treat more then 2 headaches per week.   ? cyclobenzaprine (FLEXERIL) 10 MG PO Tablet Take 1 tablet by mouth every 8 hours as needed for muscle spasms.

## 2017-02-11 DIAGNOSIS — M3501 Sicca syndrome with keratoconjunctivitis: Principal | ICD-10-CM

## 2017-02-11 MED ORDER — PREDNISOLONE ACETATE 1 % OP SUSP
1 [drp] | Freq: Four times a day (QID) | OPHTHALMIC | 0 refills | Status: CN
Start: 2017-02-11 — End: ?

## 2017-02-17 ENCOUNTER — Ambulatory Visit: Admit: 2017-02-17 | Discharge: 2017-02-17 | Attending: Family Medicine | Primary: Internal Medicine

## 2017-02-17 DIAGNOSIS — M329 Systemic lupus erythematosus, unspecified: Secondary | ICD-10-CM

## 2017-02-17 DIAGNOSIS — G4719 Other hypersomnia: Secondary | ICD-10-CM

## 2017-02-17 DIAGNOSIS — I10 Essential (primary) hypertension: Principal | ICD-10-CM

## 2017-02-17 DIAGNOSIS — I2699 Other pulmonary embolism without acute cor pulmonale: Secondary | ICD-10-CM

## 2017-02-17 DIAGNOSIS — E785 Hyperlipidemia, unspecified: Secondary | ICD-10-CM

## 2017-02-17 DIAGNOSIS — G459 Transient cerebral ischemic attack, unspecified: Secondary | ICD-10-CM

## 2017-02-17 DIAGNOSIS — R251 Tremor, unspecified: Secondary | ICD-10-CM

## 2017-02-17 DIAGNOSIS — Z79899 Other long term (current) drug therapy: Secondary | ICD-10-CM

## 2017-02-17 DIAGNOSIS — J449 Chronic obstructive pulmonary disease, unspecified: Secondary | ICD-10-CM

## 2017-02-17 DIAGNOSIS — I6350 Cerebral infarction due to unspecified occlusion or stenosis of unspecified cerebral artery: Secondary | ICD-10-CM

## 2017-02-17 DIAGNOSIS — G4730 Sleep apnea, unspecified: Principal | ICD-10-CM

## 2017-02-17 DIAGNOSIS — G478 Other sleep disorders: Secondary | ICD-10-CM

## 2017-02-17 DIAGNOSIS — Z8249 Family history of ischemic heart disease and other diseases of the circulatory system: Secondary | ICD-10-CM

## 2017-02-17 DIAGNOSIS — R0683 Snoring: Secondary | ICD-10-CM

## 2017-02-17 DIAGNOSIS — I2510 Atherosclerotic heart disease of native coronary artery without angina pectoris: Secondary | ICD-10-CM

## 2017-02-17 DIAGNOSIS — I8290 Acute embolism and thrombosis of unspecified vein: Secondary | ICD-10-CM

## 2017-02-17 DIAGNOSIS — R0689 Other abnormalities of breathing: Secondary | ICD-10-CM

## 2017-02-17 DIAGNOSIS — Z7952 Long term (current) use of systemic steroids: Secondary | ICD-10-CM

## 2017-02-17 DIAGNOSIS — IMO0002 Atrial fib/flutter, transient: Secondary | ICD-10-CM

## 2017-02-17 DIAGNOSIS — F5104 Psychophysiologic insomnia: Secondary | ICD-10-CM

## 2017-02-17 DIAGNOSIS — J309 Allergic rhinitis, unspecified: Secondary | ICD-10-CM

## 2017-02-17 DIAGNOSIS — Z9119 Patient's noncompliance with other medical treatment and regimen: Secondary | ICD-10-CM

## 2017-02-17 DIAGNOSIS — G479 Sleep disorder, unspecified: Secondary | ICD-10-CM

## 2017-02-17 DIAGNOSIS — M1990 Unspecified osteoarthritis, unspecified site: Secondary | ICD-10-CM

## 2017-02-17 DIAGNOSIS — M3500 Sicca syndrome, unspecified: Secondary | ICD-10-CM

## 2017-02-17 MED ORDER — ZALEPLON 5 MG PO CAPS
5 mg | Freq: Every evening | ORAL | 0 refills | Status: CP
Start: 2017-02-17 — End: 2017-04-09

## 2017-02-17 NOTE — Patient Instructions
1. You will be contacted by the sleep lab coordinator regarding scheduling the sleep study.  2. Please follow the sleep lab directions in regards to preparing yourself for the sleep study. Please consider waking up 1-2 hours earlier then usual on the morning of the sleep study and try to avoid excessive caffeine intake and napping the day of the sleep study in order to increase likelyhood of achieving sufficient sleep the night of testing.  3. Please assure that you have someone to drive you to and from the sleep lab, if unable to do so or if sleep deprived.  4. You will need to follow up in the sleep clinic after the sleep study is done.  5. Please contact the sleep lab referral coordinator for any inquiries regarding scheduling the sleep study (business card provided).  6. Please contact me with any medical questions pertaining to today's consultation (business card provided).

## 2017-02-21 ENCOUNTER — Encounter: Attending: Acute Care | Primary: Internal Medicine

## 2017-02-21 ENCOUNTER — Ambulatory Visit: Admit: 2017-02-21 | Discharge: 2017-02-21 | Attending: Neurology | Primary: Internal Medicine

## 2017-02-21 DIAGNOSIS — I6350 Cerebral infarction due to unspecified occlusion or stenosis of unspecified cerebral artery: Secondary | ICD-10-CM

## 2017-02-21 DIAGNOSIS — M329 Systemic lupus erythematosus, unspecified: Secondary | ICD-10-CM

## 2017-02-21 DIAGNOSIS — Z79899 Other long term (current) drug therapy: Secondary | ICD-10-CM

## 2017-02-21 DIAGNOSIS — IMO0002 Atrial fib/flutter, transient: Secondary | ICD-10-CM

## 2017-02-21 DIAGNOSIS — M542 Cervicalgia: Secondary | ICD-10-CM

## 2017-02-21 DIAGNOSIS — I2699 Other pulmonary embolism without acute cor pulmonale: Secondary | ICD-10-CM

## 2017-02-21 DIAGNOSIS — M1990 Unspecified osteoarthritis, unspecified site: Secondary | ICD-10-CM

## 2017-02-21 DIAGNOSIS — I10 Essential (primary) hypertension: Principal | ICD-10-CM

## 2017-02-21 DIAGNOSIS — M3500 Sicca syndrome, unspecified: Secondary | ICD-10-CM

## 2017-02-21 DIAGNOSIS — G459 Transient cerebral ischemic attack, unspecified: Secondary | ICD-10-CM

## 2017-02-21 DIAGNOSIS — J449 Chronic obstructive pulmonary disease, unspecified: Secondary | ICD-10-CM

## 2017-02-21 DIAGNOSIS — I8290 Acute embolism and thrombosis of unspecified vein: Secondary | ICD-10-CM

## 2017-02-21 DIAGNOSIS — E785 Hyperlipidemia, unspecified: Secondary | ICD-10-CM

## 2017-02-21 DIAGNOSIS — Z9119 Patient's noncompliance with other medical treatment and regimen: Secondary | ICD-10-CM

## 2017-02-21 DIAGNOSIS — G43711 Chronic migraine without aura, intractable, with status migrainosus: Principal | ICD-10-CM

## 2017-02-21 DIAGNOSIS — I2510 Atherosclerotic heart disease of native coronary artery without angina pectoris: Secondary | ICD-10-CM

## 2017-02-21 DIAGNOSIS — R251 Tremor, unspecified: Secondary | ICD-10-CM

## 2017-02-23 MED ORDER — ONABOTULINUMTOXINA 100 UNITS IJ SOLR
155 [IU] | Freq: Once | INTRAMUSCULAR | Status: CP
Start: 2017-02-23 — End: ?

## 2017-02-26 DIAGNOSIS — M3500 Sicca syndrome, unspecified: Secondary | ICD-10-CM

## 2017-02-26 DIAGNOSIS — M3219 Other organ or system involvement in systemic lupus erythematosus: Principal | ICD-10-CM

## 2017-02-26 DIAGNOSIS — M797 Fibromyalgia: Secondary | ICD-10-CM

## 2017-02-26 DIAGNOSIS — E559 Vitamin D deficiency, unspecified: Secondary | ICD-10-CM

## 2017-02-26 NOTE — Progress Notes
helping. For abortive, does not take anything. Lays in a dark room.she feels like the LUPUS is not well controlled which is contributing to her headaches.     Today: The patient reports a history of childhood onset migraines. However, over the past year and especially over the past 4 months the headaches have become gone occurring nearly daily lasting up to 4-5 days, with only 3-4 headache free days per month. Headaches are typically holocephalic, associated with neck pain, eye puffiness, photophobia, photophobia, osmophobia, nasal congestion, nausea/vomiting. As noted above the patient has been on    PROCEDURE NOTE:  Onabotulinum toxin A (Botox) Injections  Date of procedure: 02/21/2017  Indication: Chronic Migraine  Date of last injection: NA  Symptom most relieved: Headache  NA  Adverse effects: NA None  Procedure: Patient counseled re: risks (including fever, local injection reaction, pain, bruise, excessive localized weakness), benefits, and alternatives. Prior to the procedure, final time out performed with all members of operative team verifying informed consent, correct patient, procedure, side/level/site, correct positioning, and special equipment (as applicable) performed. Botulinum preparation: 10 units/0.1 cc normal saline; Lot number: [id number], expiration date:  10/20  Total injected: 155; 45 units wasted  Muscles injected:  Bilateral frontalis 5 units/site in a linear pattern, 4 sites = 20 units  Bilateral corrugators 5 units/site, 2 sites = 10 units  Procerus 5 units/site = 5 units  Bilateral temporalis 5+ units/site, 6 sites = 30 units  Bilateral occipitalis 5 units/site, 6 units = 30 units  Bilateral splenius capitis 5 units/site, 4 sites = 20 units  Bilateral trapezius 5 units/site, 6 sites = 30 units    Outcome: Patient tolerated the procedure well.  Plan: Follow-up in 4 weeks with [name] to assess response to Botox. Patient also advised to follow-up in 3 months for repeat Botox injection.

## 2017-02-26 NOTE — Progress Notes
helping. For abortive, does not take anything. Lays in a dark room.she feels like the LUPUS is not well controlled which is contributing to her headaches.     Today: The patient reports a history of childhood onset migraines. However, over the past year and especially over the past 4 months the headaches have become nearly daily lasting up to 4-5 days, with only 3-4 headache free days per month. Headaches are typically holocephalic, associated with neck pain, eye puffiness, photophobia, photophobia, osmophobia, nasal congestion, nausea/vomiting. As noted above the patient has been on    PROCEDURE NOTE:  Onabotulinum toxin A (Botox) Injections  Date of procedure: 02/21/2017  Indication: Chronic Migraine  Date of last injection: NA  Symptom most relieved: Headache  NA  Adverse effects: NA None  Procedure: Patient counseled re: risks (including fever, local injection reaction, pain, bruise, excessive localized weakness), benefits, and alternatives. Prior to the procedure, final time out performed with all members of operative team verifying informed consent, correct patient, procedure, side/level/site, correct positioning, and special equipment (as applicable) performed. Botulinum preparation: 10 units/0.1 cc normal saline; Lot number: [id number], expiration date:  10/20  Total injected: 155; 45 units wasted  Muscles injected:  Bilateral frontalis 5 units/site in a linear pattern, 4 sites = 20 units  Bilateral corrugators 5 units/site, 2 sites = 10 units  Procerus 5 units/site = 5 units  Bilateral temporalis 5+ units/site, 6 sites = 30 units  Bilateral occipitalis 5 units/site, 6 units = 30 units  Bilateral splenius capitis 5 units/site, 4 sites = 20 units  Bilateral trapezius 5 units/site, 6 sites = 30 units    Outcome: Patient tolerated the procedure well.  Plan: Follow-up in 4 weeks with [name] to assess response to Botox. Patient also advised to follow-up in 3 months for repeat Botox injection.
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
## 2017-02-26 NOTE — Progress Notes
lupus/OA/fibrimyalgias/Sjogren syndrome. On Gabapentin for fibromyalgia.   ?  Since last visit with Dr. Jacinto Reap ( 3/16)   At the conclusion of last visit, Dr. Jacinto Reap recommended Reglan + Mobic for abortive therapy for Migraine.   -Had recent migraine x 6 days- bloody nose, was unable to function, nausea and vomiting. + light and noise sensitivity.   Say Dr. Paulla Fore ( rheumatology) recommended that she decrease the amount of MOBIC. She was also placed on Prednisone 5mg  daily for her Lupus.   - notes that Sjogrens and Lupus is acting up- just placed on PLAQUINEL, PILOCARPINE- she was also referred to opthalmology for dry eyes, notes problems swallowing.   - she notes migraines during the month however more severe during her menses- will get a migraine 1-2 days before onset menses and 1-2 days after menses . Does get severe nausea with her headaches   - now on TOPAMAX 100mg  BID. Reports that the TOPAMAX has helped reduce the intensity and duration of the headaches however continues to have frequent headaches ( has been on this dose now x 1 months)   - she notes problems sleeping. Some nights she falls asleep however is unable to stay asleep. When she wakes up she is unable to fall back to sleep. Averages 4 hours per night. Feels exhausted all the time. Notes daytime fatigue. Sometimes she will fall asleep mid day. Recently she was told she snored.   ?  01/21/2017 visit Nichole Colon): Was last seen in April 2016--> November/December 2017 almost daily headaches. Migraines last days to a week at a time. Migraines around menses has stopped. Patient is having 25+ days with a headache. With the severe headaches she has nausea and vomitting. Swallowing even  Makes her sick. She has anorexia with the severe migraines. Patient continues to have sleep issues. She is not on a good sleep cycle. She has daytime fatigue. She reports snoring. She will fall asleep mid-day. Continues on TOPAMAX 100mg  BID. Not sure whether its

## 2017-03-12 ENCOUNTER — Ambulatory Visit: Attending: Ophthalmology | Primary: Internal Medicine

## 2017-03-12 DIAGNOSIS — H539 Unspecified visual disturbance: Secondary | ICD-10-CM

## 2017-03-12 DIAGNOSIS — Z79899 Other long term (current) drug therapy: Secondary | ICD-10-CM

## 2017-03-12 DIAGNOSIS — M3500 Sicca syndrome, unspecified: Principal | ICD-10-CM

## 2017-03-14 ENCOUNTER — Ambulatory Visit: Admit: 2017-03-14 | Discharge: 2017-03-15 | Attending: Family Medicine | Primary: Internal Medicine

## 2017-03-14 DIAGNOSIS — M329 Systemic lupus erythematosus, unspecified: Secondary | ICD-10-CM

## 2017-03-14 DIAGNOSIS — E669 Obesity, unspecified: Secondary | ICD-10-CM

## 2017-03-14 DIAGNOSIS — G43909 Migraine, unspecified, not intractable, without status migrainosus: Secondary | ICD-10-CM

## 2017-03-14 DIAGNOSIS — M797 Fibromyalgia: Secondary | ICD-10-CM

## 2017-03-14 DIAGNOSIS — G478 Other sleep disorders: Secondary | ICD-10-CM

## 2017-03-14 DIAGNOSIS — F329 Major depressive disorder, single episode, unspecified: Secondary | ICD-10-CM

## 2017-03-14 DIAGNOSIS — F419 Anxiety disorder, unspecified: Secondary | ICD-10-CM

## 2017-03-14 DIAGNOSIS — R0683 Snoring: Secondary | ICD-10-CM

## 2017-03-14 DIAGNOSIS — I2510 Atherosclerotic heart disease of native coronary artery without angina pectoris: Secondary | ICD-10-CM

## 2017-03-14 DIAGNOSIS — R0689 Other abnormalities of breathing: Secondary | ICD-10-CM

## 2017-03-14 DIAGNOSIS — G479 Sleep disorder, unspecified: Secondary | ICD-10-CM

## 2017-03-14 DIAGNOSIS — G4730 Sleep apnea, unspecified: Principal | ICD-10-CM

## 2017-03-14 DIAGNOSIS — G4719 Other hypersomnia: Secondary | ICD-10-CM

## 2017-03-14 DIAGNOSIS — I10 Essential (primary) hypertension: Secondary | ICD-10-CM

## 2017-03-18 NOTE — Progress Notes
Technician: Lowella Curb, RPSGT, [place]  Referring Physician: Orlean Patten, MD  Reading Physician:  Orlean Patten, MD  Room: # 3  Mallampati: IV  PSG#: [id number]  Epworth Sleepiness Score (Pretest): Total score: 10/24    Adult  Type: Baseline  Snoring:  moderate  Head of Bed Elevated:  No  Supine Sleep: yes  # of pillows: 1  Currently on CPAP? no   Medications:  Flexiril, Restasis, Ditiazem, Vitamin D-2, Neurontin, Hyrodiuril, Plaquenil, Salagen, Prednisolone Acetate, Deltasone, Zoloft, Vesicare, Ultram, Sonata    Nichole Colon presented to the sleep lab for a Diagnostic Sleep Study ordered by Orlean Patten, MD.    She arrived at 6:54 pm for her appointment and was placed in room #3 for her study. Patient was educated to the procedure and paperwork was filled out and signed. The patient was then prepared for electrode application using the recommended AASM guidelines for the study with all the necessary parameters including EEG, EOG, EMG, ECG, Respiratory Effort, Pulse Oximetry and Respiratory Flow.  Bio-Calibrations were completed prior to lights out.    Lights out was at 21:06, Epoch 240.  The patient took a sleep aid to help her sleep during the sleep study at 8:48 pm.  It was a Zaleplon 5 mg and prescribed by Dr. Veda Canning.  Sleep Onset was at 21:42, Epoch 312.    Throughout the night the patient exhibited moderate audible snoring. Respiratory events included obstructive apneas, hypopneas and RERAs.  SAO2 baseline was in the mid 90?s during the study with desaturations into the upper 80?s associated with respiratory events.  Heart rate ranged from 60-70?s.  She slept using 1 pillow.  Supine sleep was achieved.  The patient did visit the restroom two times during the study.  No PLM?s were observed during the study.     Lights on was at 5:14 am, Epoch 1214.  [name] was very pleasant and cooperative during the electrode application and throughout the study. She

## 2017-03-18 NOTE — Progress Notes
?   CHOLECYSTECTOMY      C-Section   ? CHOLECYSTECTOMY      Surgery Description: Cholecystectomy;   (Created by Conversion)       Allergies   Allergen Reactions   ? No Known Drug Allergy      No Known Drug Allergies       Current Outpatient Medications    Medication Sig Start Date End Date Taking? Authorizing Provider   butalbital-acetaminophen-caffeine (FIORICET, ESGIC) 50-325-40 MG PO Tablet Take 1-2 tabs at headache onset. May repeat 1 tab in 6 hours if needed. Do not treat more then 2 headaches per week. 01/21/17  Yes [name], ARNP   cyclobenzaprine (FLEXERIL) 10 MG PO Tablet Take 1 tablet by mouth every 8 hours as needed for muscle spasms. 01/29/17  Yes Thway, Myint Myat, MD   cycloSPORINE (RESTASIS) 0.05 % OP Emulsion Place 1 drop into both eyes every 12 hours. 01/13/17  Yes Liam Rogers, MD   dilTIAZem (DILT-XR) 180 MG PO Capsule Extended Release 24 Hour TAKE 1 CAPSULE BY MOUTH DAILY 01/15/17  Yes Gretchen Short, MD   ergocalciferol (vitamin D-2) 50000 units PO Capsule TAKE 1 CAPSULE BY MOUTH 1 TIME A WEEK ON AN EMPTY STOMACH 01/15/17  Yes Gretchen Short, MD   gabapentin (NEURONTIN) 600 MG Tablet TAKE 1 TABLET BY MOUTH THREE TIMES DAILY 02/12/16  Yes Gretchen Short, MD   hydroCHLOROthiazide (HYDRODIURIL) 25 MG PO Tablet TAKE 1 TABLET BY MOUTH DAILY 01/15/17  Yes Gretchen Short, MD   hydroxychloroquine (PLAQUENIL) 200 MG PO Tablet Take 1 tablet by mouth 2 times daily. 01/29/17  Yes Thway, Myint Myat, MD   pilocarpine (SALAGEN) 5 MG Tablet Take 1 tablet by mouth 2 times daily. 02/20/16  Yes Thway, Myint Myat, MD   predniSONE (DELTASONE) 5 MG PO Tablet Take 1 tablet by mouth daily. 01/31/17  Yes Thway, Myint Myat, MD   sertraline (ZOLOFT) 50 MG PO Tablet TAKE 1 TABLET BY MOUTH EVERY DAY 01/15/17  Yes Gretchen Short, MD   solifenacin (VESICARE) 5 MG PO Tablet Take 1 tablet by mouth daily. 01/15/17  Yes Gretchen Short, MD   traMADol (ULTRAM) 50 MG PO Tablet Take 1 tablet by mouth every 8 hours as

## 2017-03-18 NOTE — Progress Notes
Disordered breathing was exacerbated in association with the supine sleeping position with respective AHI of 7.6, compared to that of 2 in nonsupine sleeping position.   REM sleep was also associated with exacerbation of sleep disordered breathing with respective AHI of 10.9 compared to that of 0.6 in non-REM sleep.   Overlap of supine and REM sleep was associated with further exacerbation to severe sleep-disordered breathing with cumulative REM/supine AHI of 44.2 (although only minimal overlap was recorded).  ?  Clinically significant sleep related hypoxia was not seen in association with sleep disordered breathing with total of 0.2 min of SpO2 below 88% and SpO2 nadir of 82%.  ?  Cheyne-Stokes breathing pattern was not noted.  ?  Snoring was noted to be moderate.   ?  Patient's arousal index was mildly elevated at 10.4, with respective respiratory arousal index of 3.9.   ?  Total limb movement index was 0.5, with respective arousal index of 0.  PLMI was 0.  ??  The electrocardiogram showed normal sinus rhythm.  Average heart rate was 65.  ?  VI. IMPRESSION, REPORT & PLAN:  ?  FINDINGS:  No clinically significant sleep disordered breathing overall, however, findings could be underestimated due to the fact that minimal overlap of REM supine sleep was recorded.  The patient demonstrated mild sleep-disordered breathing in association with both supine and REM sleep; of significant severity during the overlap of the two.  ?  RECOMMENDATIONS:   The patient does not qualify for treatment with positive airway pressure therapy considering the above findings, however, could consider conservative measures including position restriction to lateral, weight loss, optimizing nasal/sinus patency as well as utility of oral appliance. If clinical suspicion for more significant sleep disordered breathing noted on clinical grounds, could consider repeating polysomnography.  ?

## 2017-03-18 NOTE — Progress Notes
DIAGNOSTIC SLEEP STUDY INTERPRETATION  ?  Date/Time: 03/18/17  Performed by: Orlean Patten  Authorized by: Orlean Patten  ?  I. PATIENT IDENTIFICATION  Patient Name: Nichole Colon  Sex: female  Age: 51 y.o.  DOB:  1966-10-16  Date of Study: 03/14/2017  MRN: [id number]  Referring Physician: Orlean Patten, MD  Recording PSG Tech: Jeanella Craze, RPSGT, [facility]  Scoring PSG Tech: Jeanella Craze, RPSGT, [facility]  Height (in): 67  Weight (lbs): 192  BMI: 30  Neck size (in): 15  DIAGNOSIS: Sleep Disorder  ICD-10 code: G54.9  Reading Physician: Orlean Patten, MD  ??  ??  II. INTRODUCTION  ??  1. HISTORY:    [name]?is a 51 y.o.?female?with query history of obstructive sleep apnea and comorbidities os coronary artery disease, hypertension, SLE/Sjogren's, chronic migraines, fibromyalgia, depression/anxiety and class I?obesity who presented with fragmented and non-restorative sleep admitting to snoring, snort respirations (?cough during sleep) and poor sleep quality, in general. She further endorsed daytime sleepiness.   The patient presented for attended polysomnography given the above sleep related concerns.  ??  2. QUESTIONNAIRE DATA: See attached.  ?  III. TECHNICAL DESCRIPTION  A standard full polysomnography was performed.  Sleep staging was monitored using frontal, central and occipital EEG leads, two EOG leads and a submental EMG lead, as required by the American Academy of Sleep Medicine.  Heart rate was monitored continuously.  Respiration was monitored using a thermistor, a nasal pressure transducer along with chest and abdominal respitrace bands, snoring microphone and pulse oximetry.  Periodic limb movements were detected using bilateral tibialis EMGs.  Continuous audio and video monitoring was also performed.  Body positioning was also noted.   ??  IV. DESCRIPTION OF PSG CHARACTERISTICS  As defined by the American Academy of Sleep Medicine (AASM), an APNEA is

## 2017-03-18 NOTE — Progress Notes
comprised 3.1% of TST; N2 63% of TST, N3 10.4% of TST, REM 23.5% of TST.       The patient had a total of 4 obstructive apneas, 0 mixed apneas, 1 central apneas, 15 hypopneas and 19 RERAs.     The disordered breathing profile demonstrated no clinically significant sleep apnea or hypopnea with overall AHI of 3  and a RDI of 5.9.    Disordered breathing was exacerbated in association with the supine sleeping position with respective AHI of 7.6, compared to that of 2 in nonsupine sleeping position.   REM sleep was also associated with exacerbation of sleep disordered breathing with respective AHI of 10.9 compared to that of 0.6 in non-REM sleep.   Overlap of supine and REM sleep was associated with further exacerbation to severe sleep-disordered breathing with cumulative REM/supine AHI of 44.2 (although only minimal overlap was recorded).    Clinically significant sleep related hypoxia was not seen in association with sleep disordered breathing with total of 0.2 min of SpO2 below 88% and SpO2 nadir of 82%.    Cheyne-Stokes breathing pattern was not noted.    Snoring was noted to be moderate.     Patient's arousal index was mildly elevated at 10.4, with respective respiratory arousal index of 3.9.     Total limb movement index was 0.5, with respective arousal index of 0.  PLMI was 0.     The electrocardiogram showed normal sinus rhythm.  Average heart rate was 65.    VI. IMPRESSION, REPORT & PLAN:    FINDINGS:  No clinically significant sleep disordered breathing overall, however, findings could be underestimated due to the fact that minimal overlap of REM supine sleep was recorded.  The patient demonstrated mild sleep-disordered breathing in association with both supine and REM sleep; of significant severity during the overlap of the two.    RECOMMENDATIONS:   The patient does not qualify for treatment with positive airway pressure therapy considering the above findings, however, could consider

## 2017-03-18 NOTE — Progress Notes
conservative measures including position restriction to lateral, weight loss, optimizing nasal/sinus patency as well as utility of oral appliance. If clinical suspicion for more significant sleep disordered breathing noted on clinical grounds, could consider repeating polysomnography.

## 2017-03-18 NOTE — Progress Notes
stated she slept a little better than she does at home.  She was informed the results of his study would be forwarded to the ordering provider after the sleep physician interpreted the study.  No timetable was given to the patient.  Patient was alert and awake upon departure and escorted to elevator at 5:50 am.

## 2017-03-18 NOTE — Progress Notes
5. OTHER COMORBIDITIES. As per treating clinicians.    DISPOSITION: Nichole Colon will return to the sleep clinic upon completion of the sleep study; sooner as/if indicated.   The patient verbalized understanding and agreement with the above and was encouraged to contact me with any questions/concerns in the interim.

## 2017-03-18 NOTE — Progress Notes
criteria are met.  - Treatment options reviewed with the patient including PAP, oral appliances, positional therapy, weight loss or surgery, depending on class, nature and severity of sleep disordered breathing, with PAP being a gold standard therapy for sleep apnea.  - The patient was educated on pathophysiology of sleep apnea and cardiovascular and neurological risks associated with untreated condition.   - Correlation between excessive weight and sleep disordered breathing revised.    - The patient is agreeable to proceed with testing as advised.   - Prescription for zaleplon 5 mg (#2 capsules) was provided for help with sleep induction in the sleep lab as/if indicated.  - The patient was educated on risks of driving or operating machinery while sleepy, including injury and death and indicated good understanding and agreement to not drive while sleepy.      2. CHRONIC INSOMNIA.  - Predominantly of maintenance type.  - Appears to be multifactorialand and likely secondary to combination of #1, improper sleep hygiene, possibly uncontrolled pain/GAD/depression, etc.  - It would be most appropriate to assess for underlying etiology, with obstructive sleep apnea being likely culprit, treat accordingly and then reevaluate.  - She was advised on nap avoidance in order to build sleep drive and optimize sleep hygiene.  - Prescription for zaleplon 5 mg (#2 capsules) was provided for help with sleep induction in the sleep lab as/if indicated.    3. CHRONIC ALLERGIC RHINITIS.  - Currently on no therapy.  - Advised intranasal steroids - she will start Flonase, twice per day.  - Discussed its quality affect and importance of consistent usage.    4. ? PLMD.   - Will assess for leg movement burden on sleep (and PLMD) and treat accordingly.    - Did not order ferritin level (question false normal due to chronic inflammatory disease).  - Will reevaluate clinically and treat based on symptoms.

## 2017-03-18 NOTE — Progress Notes
Respiratory: No shortness of breath or wheezing. Otherwise as per HPI.  Musculoskeletal: No functional deficits or obvious deformities.  Neuro: No obvious deficits.  Psych: Positive depression/anxiety.  Skin: No rashes.  Allergic: As per allergy profile.    Vitals:    02/17/17 1119   BP: 133/83   Pulse: 69   Resp: 16   Temp: 37 ?C (98.6 ?F)   Weight: 87.1 kg (192 lb)   Height: 1.702 m (5\' 7" )     Body mass index is 30.07 kg/(m^2).    PHYSICAL EXAM:  GENERAL: Well groomed. Appears stated age.  HEENT: Normocephalic, atraumatic. No conjunctival erythema. No scleral icterus. External ears symmetric without lesions or deformity. No retro- or micrognathia.   Nasal airways appear inflamed. Pharyngeal exam reveals severe crowding with Mallampati score of 4. Neck circumference is 15 in.  HEART: S1/S2 normal. Regular rate and rhythm. No murmurs, rubs or gallops.  LUNGS: Unlabored breathing. Symmetric chest wall movement. Clear to auscultation bilaterally.  Abdomen: Soft. Non-tender.  EXTREMITIES: No clubbing cyanosis or edema.  MUSCULOSKELETAL: Normal mobility. No deformities.  NEURO: Alert, oriented x 3. No obvious focal deficits. Cranial nerves II-XII grossly intact.  SKIN: No rashes. Normal turgor.  PSYCH: Appropriate affect. Well directed speech. Maintained eye contact.    ASSESSMENT/PLAN:    Nichole Colon is a 51 y.o. year old female with ongoing sleep disturbance with query history of sleep apnea and comorbidities of coronary artery disease, hypertension, SLE/Sjogren's, fibromyalgia, depression/anxiety and chronic migraines.  My assessment is as follows:    1. SLEEP DISORDERED BREATHING; ?OSA per history - no records available and under no therapy at current, with symptomatic progression.  - I have advised further evaluation through polysomnography to assess for the presence, severity and classification of sleep disordered breathing.   - Formal polysomnography can be converted into split night protocol, if

## 2017-03-18 NOTE — Progress Notes
SLEEP MEDICINE CONSULT    REASON FOR CONSULTATION:  Sleep disturbance.    HISTORY OF PRESENT ILLNESS:  Nichole Colon is a 51 y.o. female with query history of obstructive sleep apnea and comorbidities os coronary artery disease, hypertension, SLE/Sjogren's, chronic migraines, fibromyalgia, depression/anxiety and class I obesity with Body mass index is 30.07 kg/(m^2). who presents for sleep evaluation.    She is very dissatisfied with the quality of her sleep, primarily so with sleep maintenance.    [name] tells me that she underwent sleep study some years ago and believes she was told she had sleep apnea, however, could not recall further details. She does not have details on where or when the study was done. I do not have those records available in that respect. At any event, she has not followed through with treatment and endorses symptomatic progression over a years admittedly so in association with weight gain.    [name] was commented on snoring; she is uninsured in regards to observed breathing pauses during sleep - there is no bed partner to witness. She endorses, however, frequent resuscitative respirations of sleep, occasionally associated with cough. She frequently wakes up ith sore throat.Those symptoms have been ongoing for years. Symptoms have been getting progressively worse over time. Breathing difficulties during sleep do seem to be exacerbated in the supine sleeping position. Sleep is described as non-restorative and fragmented. It is associated with daytime fatigue and sleepiness.    [name] has rather somewhat consistent sleep routine, with bedtime etween 9 and 11 PM and final morning awakening 4 AM (despite preference to sleep in longer). [name] takes an average of  15-60 min to fall asleep. [name] gives an average of 2-3 awakenings per night due to aforementioned breathing difficulties, nocturia, pets and environmental

## 2017-03-18 NOTE — Progress Notes
Periodic limb movements were detected using bilateral tibialis EMGs.  Continuous audio and video monitoring was also performed.  Body positioning was also noted.      IV. DESCRIPTION OF PSG CHARACTERISTICS  As defined by the American Academy of Sleep Medicine (AASM), an APNEA is scored when there is cessation of airflow with a drop of at least 90% of the baseline thermistor signal for a minimum duration of 10 seconds.  A HYPOPNEA is defined as an abnormal respiratory event lasting at least 10 seconds with at least a 30% reduction in thoracoabdominal movement or airflow as compared to baseline, and with at least a 4% oxygen desaturation (rule 1B of the 2013 AASM guidelines).  A RESPIRATORY EFFORT RELATED AROUSAL (RERA) is defined as sequence of breaths lasting ?10 seconds characterized by increasing respiratory effort or by flattening of the inspiratory portion of the nasal pressure (diagnostic study) or PAP device flow (titration study) waveform leading to arousal from sleep when the sequence of breaths does not meet criteria for an apnea or hypopnea.  Many insurance companies do not accept RERAs as a measure of disease severity.     V. SUMMARY  This was an attended overnight diagnostic study done in the sleep laboratory. The patient was prepared for the study without problems and lights out occurred at 9:06 PM.  The study was terminated at 5:14 AM.    The study was carried out per standard full night protocol.     Total sleep time was 398.5 minutes with mildly diminished sleep efficiency of 81.7%. Sleep onset latency was borderline at 36 min. Wake after sleep onset time was 53 min.  REM onset latency was prolonged at 242 min. Supine sleep was recorded. Overlap of supine and REM sleep was only briefly recorded.    Sleep architecture was otherwise fairly unremarkable besides low percentage of slow-wave sleep with following parameters:  N1 sleep

## 2017-03-18 NOTE — Progress Notes
SLEEP MEDICINE CONSULT    REASON FOR CONSULTATION:  Sleep disturbance.    HISTORY OF PRESENT ILLNESS:  Anikah Hogge is a 50 y.o. female with query history of obstructive sleep apnea and comorbidities os coronary artery disease, hypertension, SLE/Sjogren's, chronic migraines, fibromyalgia, depression/anxiety and class I obesity with Body mass index is 30.07 kg/(m^2). who presents for sleep evaluation.    She is very dissatisfied with the quality of her sleep, primarily so with sleep maintenance.    Ms. Blahnik tells me that she underwent sleep study some years ago and believes she was told she had sleep apnea, however, could not recall further details. She does not have details on where or when the study was done. I do not have those records available in that respect. At any event, she has not followed through with treatment and endorses symptomatic progression over a years admittedly so in association with weight gain.    Eudelia Hiltunen was commented on snoring; she is unsure in regards to observed breathing pauses during sleep - there is no bed partner to witness. She endorses, however, frequent resuscitative respirations of sleep, occasionally associated with cough. She frequently wakes up ith sore throat.Those symptoms have been ongoing for years. Symptoms have been getting progressively worse over time. Breathing difficulties during sleep do seem to be exacerbated in the supine sleeping position. Sleep is described as non-restorative and fragmented. It is associated with daytime fatigue and sleepiness.    Rudie Rikard has rather somewhat consistent sleep routine, with bedtime etween 9 and 11 PM and final morning awakening 4 AM (despite preference to sleep in longer). Mairin Lindsley takes an average of  15-60 min to fall asleep. Lyndal Alamillo gives an average of 2-3 awakenings per night due to aforementioned breathing difficulties, nocturia, pets and environmental

## 2017-03-18 NOTE — Progress Notes
needed for pain. 01/29/17  Yes Thway, Myint Vic Blackbird, MD   prednisoLONE acetate (PRED FORTE) 1 % OP Suspension Place 1 drop into both eyes 4 times daily for 14 days. 01/14/17 01/28/17  Verdene Lennert, MD       Family History   Problem Relation Age of Onset   ? Asthma Mother    ? Heart Disease Mother    ? Hypertension Mother    ? Stroke Mother    ? Vision Loss Mother    ? Asthma Brother    ? Birth Defects Brother    ? Early Death Brother    ? Heart Disease Brother    ? High Cholesterol Brother    ? Hypertension Brother    ? Asthma Daughter    ? High Cholesterol Daughter    ? Hypertension Daughter    ? Asthma Maternal Aunt    ? Heart Disease Maternal Aunt    ? Hypertension Maternal Aunt    ? Miscarriages / Stillbirths Maternal Aunt    ? Stroke Maternal Aunt    ? Vision Loss Maternal Aunt    ? Heart Disease Maternal Uncle    ? Hypertension Maternal Uncle    ? Stroke Maternal Uncle    ? Arthritis Maternal Grandmother    ? Asthma Maternal Grandmother    ? Clotting Disorder Maternal Grandmother    ? Heart Disease Maternal Grandmother    ? Hypertension Maternal Grandmother    ? Miscarriages / Stillbirths Maternal Grandmother    ? Vision Loss Maternal Grandmother        Social History     Social History   ? Marital status: Single     Spouse name: N/A   ? Number of children: N/A   ? Years of education: N/A     Occupational History   ? Not on file.     Social History Main Topics   ? Smoking status: Never Smoker   ? Smokeless tobacco: Never Used   ? Alcohol use No   ? Drug use: No   ? Sexual activity: Not Currently     Other Topics Concern   ? Not on file     Social History Narrative       REVIEW OF SYSTEMS:  Constitutional: Positive daytime sleepiness and fatigue. No unintentional weight loss.  Eyes: No vision changes, eye pain or eye discharge.  Ears, nose and throat: No hearing loss, ear pain. Otherwise as per HPI.  Cardiovascular: No chest pain, palpitations or exertional dyspnea.

## 2017-03-18 NOTE — Progress Notes
factors, giving frequent difficulties resuming sleep afterwards. Nichole Colon estimates total sleep time at night around 4-5 hours on an average night. Nichole Colon takes afternoon naps on a consistent basis.    Nichole Colon scored 11/24 on a self-rated ESS today, giving 0 points to propensity to doze while driving.     Nichole Colon gives query symptoms suggestive of restless leg syndrome (versus fibromyalgia) and excessive limb movements of sleep.    No parasomnias such as frequent nightmares, sleep terrors, somnambulism or dream enactment behaviors. No sleep hallucinations, sleep paralysis or symptoms suggestive of cataplexy.   Positive bruxism.    Caffeine consumption is estimated at 1 cup of coffee per day.  Alcohol consumption is denied. No smoking.    No known family history of sleep apnea.    Patient Active Problem List   Diagnosis   ? Plantar fascial fibromatosis   ? Systemic lupus erythematosus   ? Sicca syndrome   ? Osteoarthrosis involving lower leg   ? Other disorder of menstruation and other abnormal bleeding from female genital tract   ? Unspecified transient cerebral ischemia   ? Other chronic pain   ? Other malaise and fatigue   ? Routine general medical examination at a health care facility   ? Allergy, unspecified not elsewhere classified   ? Chronic airway obstruction, not elsewhere classified   ? Essential hypertension   ? Gonococcal infection (acute) of lower genitourinary tract   ? Other and unspecified hyperlipidemia   ? Pain in limb   ? Sjogrens syndrome   ? Fibromyalgia   ? Migraine without aura and without status migrainosus, not intractable   ? Chronic bilateral low back pain without sciatica   ? OAB (overactive bladder)   ? Body mass index 28.0-28.9, adult   ? Medicare annual wellness visit, subsequent   ? Screening mammogram, encounter for   ? Screening for malignant neoplasm of cervix   ? Chest pain   ? Esophageal dysphagia       Past Surgical History:   Procedure Laterality Date

## 2017-03-18 NOTE — Progress Notes
scored when there is cessation of airflow with a drop of at least 90% of the baseline thermistor signal for a minimum duration of 10 seconds.  A HYPOPNEA is defined as an abnormal respiratory event lasting at least 10 seconds with at least a 30% reduction in thoracoabdominal movement or airflow as compared to baseline, and with at least a 4% oxygen desaturation (rule 1B of the 2013 AASM guidelines).  A RESPIRATORY EFFORT RELATED AROUSAL (RERA) is defined as sequence of breaths lasting ?10 seconds characterized by increasing respiratory effort or by flattening of the inspiratory portion of the nasal pressure (diagnostic study) or PAP device flow (titration study) waveform leading to arousal from sleep when the sequence of breaths does not meet criteria for an apnea or hypopnea.  Many insurance companies do not accept RERAs as a measure of disease severity.  ??  V. SUMMARY  This was an attended overnight diagnostic study done in the sleep laboratory. The patient was prepared for the study without problems and lights out occurred at 9:06 PM.  The study was terminated at 5:14 AM.  ?  The study was carried out per standard full night protocol.   ?  Total sleep time was 398.5 minutes with mildly diminished sleep efficiency of 81.7%. Sleep onset latency was borderline at 36 min. Wake after sleep onset time was 53 min.  REM onset latency was prolonged at 242 min. Supine sleep was recorded. Overlap of supine and REM sleep was only briefly recorded.  ?  Sleep architecture was otherwise fairly unremarkable besides low percentage of slow-wave sleep with following parameters:  N1 sleep comprised 3.1% of TST; N2 63% of TST, N3 10.4% of TST, REM 23.5% of TST.     ?  The patient had a total of 4 obstructive apneas, 0 mixed apneas, 1 central apneas, 15 hypopneas and 19 RERAs.   ?  The disordered breathing profile demonstrated no clinically significant sleep apnea or hypopnea with overall AHI of 3  and a RDI of 5.9.

## 2017-03-18 NOTE — Progress Notes
stated she slept a little better than she does at home.  She was informed the results of his study would be forwarded to the ordering provider after the sleep physician interpreted the study.  No timetable was given to the patient.  Patient was alert and awake upon departure and escorted to elevator at 5:50 am.    DIAGNOSTIC SLEEP STUDY INTERPRETATION    Date/Time: 03/18/17  Performed by: Orlean Patten  Authorized by: Orlean Patten    I. PATIENT IDENTIFICATION  Patient Name: Nichole Colon  Sex: female  Age: 51 y.o.  DOB:  January 10, 1966  Date of Study: 03/14/2017  MRN: [id number]  Referring Physician: Orlean Patten, MD  Recording PSG Tech: Jeanella Craze, RPSGT, [facility]  Scoring PSG Tech: Jeanella Craze, RPSGT, [facility]  Height (in): 67  Weight (lbs): 192  BMI: 30  Neck size (in): 15  DIAGNOSIS: Sleep Disorder  ICD-10 code: G3.9  Reading Physician: Orlean Patten, MD        II. INTRODUCTION     1. HISTORY:    Nichole Colon is a 51 y.o. female with query history of obstructive sleep apnea and comorbidities os coronary artery disease, hypertension, SLE/Sjogren's, chronic migraines, fibromyalgia, depression/anxiety and class I obesity who presented with fragmented and non-restorative sleep admitting to snoring, snort respirations (?cough during sleep) and poor sleep quality, in general. She further endorsed daytime sleepiness.   The patient presented for attended polysomnography given the above sleep related concerns.     2. QUESTIONNAIRE DATA: See attached.    III. TECHNICAL DESCRIPTION  A standard full polysomnography was performed.  Sleep staging was monitored using frontal, central and occipital EEG leads, two EOG leads and a submental EMG lead, as required by the American Academy of Sleep Medicine.  Heart rate was monitored continuously.  Respiration was monitored using a thermistor, a nasal pressure transducer along with chest and abdominal respitrace bands, snoring microphone and pulse oximetry.

## 2017-03-24 ENCOUNTER — Encounter: Attending: Trauma Surgery | Primary: Internal Medicine

## 2017-03-25 ENCOUNTER — Encounter: Attending: Acute Care | Primary: Internal Medicine

## 2017-03-25 ENCOUNTER — Encounter: Attending: Ophthalmology | Primary: Internal Medicine

## 2017-03-26 ENCOUNTER — Encounter: Attending: Internal Medicine | Primary: Internal Medicine

## 2017-03-26 ENCOUNTER — Encounter: Attending: Family Medicine | Primary: Internal Medicine

## 2017-04-01 ENCOUNTER — Ambulatory Visit: Admit: 2017-04-01 | Discharge: 2017-04-01 | Attending: Acute Care | Primary: Internal Medicine

## 2017-04-01 DIAGNOSIS — J449 Chronic obstructive pulmonary disease, unspecified: Secondary | ICD-10-CM

## 2017-04-01 DIAGNOSIS — I8290 Acute embolism and thrombosis of unspecified vein: Secondary | ICD-10-CM

## 2017-04-01 DIAGNOSIS — G459 Transient cerebral ischemic attack, unspecified: Secondary | ICD-10-CM

## 2017-04-01 DIAGNOSIS — E785 Hyperlipidemia, unspecified: Secondary | ICD-10-CM

## 2017-04-01 DIAGNOSIS — M3500 Sicca syndrome, unspecified: Secondary | ICD-10-CM

## 2017-04-01 DIAGNOSIS — M329 Systemic lupus erythematosus, unspecified: Secondary | ICD-10-CM

## 2017-04-01 DIAGNOSIS — I2699 Other pulmonary embolism without acute cor pulmonale: Secondary | ICD-10-CM

## 2017-04-01 DIAGNOSIS — I10 Essential (primary) hypertension: Principal | ICD-10-CM

## 2017-04-01 DIAGNOSIS — I4892 Unspecified atrial flutter: Secondary | ICD-10-CM

## 2017-04-01 DIAGNOSIS — G43709 Chronic migraine without aura, not intractable, without status migrainosus: Principal | ICD-10-CM

## 2017-04-01 DIAGNOSIS — Z9119 Patient's noncompliance with other medical treatment and regimen: Secondary | ICD-10-CM

## 2017-04-01 DIAGNOSIS — G43009 Migraine without aura, not intractable, without status migrainosus: Secondary | ICD-10-CM

## 2017-04-01 DIAGNOSIS — Z79891 Long term (current) use of opiate analgesic: Secondary | ICD-10-CM

## 2017-04-01 DIAGNOSIS — M797 Fibromyalgia: Secondary | ICD-10-CM

## 2017-04-01 DIAGNOSIS — M1990 Unspecified osteoarthritis, unspecified site: Secondary | ICD-10-CM

## 2017-04-01 DIAGNOSIS — I2510 Atherosclerotic heart disease of native coronary artery without angina pectoris: Secondary | ICD-10-CM

## 2017-04-01 DIAGNOSIS — I4891 Unspecified atrial fibrillation: Secondary | ICD-10-CM

## 2017-04-01 DIAGNOSIS — R251 Tremor, unspecified: Secondary | ICD-10-CM

## 2017-04-01 DIAGNOSIS — I6350 Cerebral infarction due to unspecified occlusion or stenosis of unspecified cerebral artery: Secondary | ICD-10-CM

## 2017-04-01 DIAGNOSIS — Z79899 Other long term (current) drug therapy: Secondary | ICD-10-CM

## 2017-04-01 DIAGNOSIS — R51 Headache: Secondary | ICD-10-CM

## 2017-04-01 DIAGNOSIS — IMO0002 Atrial fib/flutter, transient: Secondary | ICD-10-CM

## 2017-04-01 MED ORDER — TOPIRAMATE 25 MG PO CPSP
3 refills | Status: CP
Start: 2017-04-01 — End: 2017-06-27

## 2017-04-01 NOTE — Progress Notes
for abortive therapy for Migraine.   -Had recent migraine x 6 days- bloody nose, was unable to function, nausea and vomiting. + light and noise sensitivity.   Say Dr. Paulla Fore ( rheumatology) recommended that she decrease the amount of MOBIC. She was also placed on Prednisone 5mg  daily for her Lupus.   - notes that Sjogrens and Lupus is acting up- just placed on PLAQUINEL, PILOCARPINE- she was also referred to opthalmology for dry eyes, notes problems swallowing.   - she notes migraines during the month however more severe during her menses- will get a migraine 1-2 days before onset menses and 1-2 days after menses . Does get severe nausea with her headaches   - now on TOPAMAX 100mg  BID. Reports that the TOPAMAX has helped reduce the intensity and duration of the headaches however continues to have frequent headaches ( has been on this dose now x 1 months)   - she notes problems sleeping. Some nights she falls asleep however is unable to stay asleep. When she wakes up she is unable to fall back to sleep. Averages 4 hours per night. Feels exhausted all the time. Notes daytime fatigue. Sometimes she will fall asleep mid day. Recently she was told she snored.     Currently:  Was last seen in April 2016--> November/December 2017 almost daily headaches. Migraines last days to a week at a time. Migraines around menses has stopped. Patient is having 25+ days with a headache. With the severe headaches she has nausea and vomitting. Swallowing even  Makes her sick. She has anorexia with the severe migraines. Patient continues to have sleep issues. She is not on a good sleep cycle. She has daytime fatigue. She reports snoring. She will fall asleep mid-day. Continues on TOPAMAX 100mg  BID. Not sure whether its helping. For abortive, does not take anything. Lays in a dark room.she feels like the LUPUS is not well controlled which is contributing to her headaches.     Currently: since last visit had hospital visit for chest pain with

## 2017-04-01 NOTE — Progress Notes
No teaching statement needed

## 2017-04-01 NOTE — Progress Notes
?   Nitrite -Ur 01/25/2017 Negative    ? Protein-UA 01/25/2017 Negative    ? Glucose -Ur 01/25/2017 Negative    ? Blood, UA 01/25/2017 Negative    ? WBC, UA 01/25/2017 Trace*   ? Bilirubin -Ur 01/25/2017 Negative    ? Ketones -UR 01/25/2017 Negative    ? Ventricular Rate 01/26/2017 60    ? Atrial Rate 01/26/2017 60    ? P-R Interval 01/26/2017 140    ? QRS Duration 01/26/2017 90    ? Q-T Interval 01/26/2017 400    ? QTC Calculation (Bezet) 01/26/2017 400    ? Calculated P Axis 01/26/2017 61    ? Calculated R Axis 01/26/2017 13    ? Calculated T Axis 01/26/2017 -34    Autoreleased Orders on 01/23/2017   Component Date Value   ? WHITE BLOOD CELL COUNT 12/25/2016 3.1*   ? RBC 12/25/2016 4.23    ? Hemoglobin 12/25/2016 11.9    ? Hematocrit 12/25/2016 35.6    ? MCV 12/25/2016 84.2    ? [place] 12/25/2016 28.1    ? MCHC 12/25/2016 33.4    ? RDW 12/25/2016 13.1    ? Platelets 12/25/2016 224    ? MPV 12/25/2016 10.2    ? Neutrophils Absolute 12/25/2016 1020*   ? Lymphocytes Absolute 12/25/2016 1767    ? Monocytes Absolute 12/25/2016 161*   ? Eosinophils Absolute 12/25/2016 143    ? Basophils Absolute 12/25/2016 9    ? Neutrophils 12/25/2016 32.9    ? LYMPHOCYTES 12/25/2016 57.0    ? MONOCYTES 12/25/2016 5.2    ? EOSINOPHILS 12/25/2016 4.6    ? BASOPHILS 12/25/2016 0.3    ? Glucose 12/25/2016 85    ? Urea Nitrogen 12/25/2016 9    ? Creatinine 12/25/2016 0.90    ? EGFR 12/25/2016 75    ? Glom Filt Rate, Est Afri* 12/25/2016 86    ? BUN/Creatinine Ratio 24/58/0998 NOT APPLICABLE    ? Sodium 12/25/2016 141    ? Potassium 12/25/2016 3.5    ? Chloride 12/25/2016 109    ? CARBON DIOXIDE 12/25/2016 27    ? Calcium 12/25/2016 9.0    ? Protein, Total 12/25/2016 7.0    ? ALBUMIN 12/25/2016 3.7    ? Globulin 12/25/2016 3.3    ? ALBUMIN/GLOBULIN RATIO 12/25/2016 1.1    ? Total Bilirubin 12/25/2016 0.3    ? Alkaline Phosphatase 12/25/2016 56    ? AST 12/25/2016 10    ? ALT 12/25/2016 5*   ? C3 Complement 12/25/2016 91

## 2017-04-01 NOTE — Progress Notes
?   C4 Complement 03/07/2017 19    ? SED RATE BY MODIFIED WES* 03/07/2017 14    ? Vit D, 25-Hydroxy 03/07/2017 25*   ? Color -Ur 03/07/2017 YELLOW    ? APPEARANCE 03/07/2017 CLEAR    ? Specific Gravity -Ur 03/07/2017 1.017    ? pH -Ur 03/07/2017 6.5    ? Glucose -Ur 03/07/2017 NEGATIVE    ? Bilirubin -Ur 03/07/2017 NEGATIVE    ? Ketones UA 03/07/2017 NEGATIVE    ? Hb -Ur 03/07/2017 NEGATIVE    ? Protein-UA 03/07/2017 NEGATIVE    ? NITRITE 03/07/2017 NEGATIVE    ? Leukocyte Esterase -Ur 03/07/2017 NEGATIVE    ? WBC -Ur 03/07/2017 NONE SEEN    ? RBC -Ur 03/07/2017 NONE SEEN    ? Squam Epithel, UA 03/07/2017 NONE SEEN    ? BACTERIA 03/07/2017 NONE SEEN    ? Hyaline Casts, UA 03/07/2017 NONE SEEN    ? dsDNA Ab 03/07/2017 24*   Admission on 01/25/2017, Discharged on 01/28/2017   Component Date Value   ? Ventricular Rate 01/25/2017 63    ? Atrial Rate 01/25/2017 63    ? P-R Interval 01/25/2017 164    ? QRS Duration 01/25/2017 88    ? Q-T Interval 01/25/2017 412    ? QTC Calculation (Bezet) 01/25/2017 421    ? Calculated P Axis 01/25/2017 49    ? Calculated R Axis 01/25/2017 -5    ? Calculated T Axis 01/25/2017 -58    ? Color -Ur 01/25/2017 Yellow    ? Clarity, UA 01/25/2017 Clear    ? Spec Grav 01/25/2017 1.010    ? pH 01/25/2017 7.0*   ? Urobilinogen -Ur 01/25/2017 0.2    ? Nitrite -Ur 01/25/2017 Negative    ? Protein-UA 01/25/2017 Negative    ? Glucose -Ur 01/25/2017 Negative    ? Blood, UA 01/25/2017 Trace*   ? WBC, UA 01/25/2017 Negative    ? Bilirubin -Ur 01/25/2017 Negative    ? Ketones -UR 01/25/2017 Negative    ? Ventricular Rate 01/25/2017 59    ? Atrial Rate 01/25/2017 59    ? P-R Interval 01/25/2017 156    ? QRS Duration 01/25/2017 90    ? Q-T Interval 01/25/2017 422    ? QTC Calculation (Bezet) 01/25/2017 417    ? Calculated P Axis 01/25/2017 62    ? Calculated R Axis 01/25/2017 5    ? Calculated T Axis 01/25/2017 -47    ? Sodium 01/25/2017 142    ? Potassium 01/25/2017 3.5    ? Chloride 01/25/2017 103

## 2017-04-01 NOTE — Progress Notes
?   C4 Complement 12/25/2016 19    ? dsDNA Ab 12/25/2016 47*   ? SED RATE BY MODIFIED WES* 12/25/2016 11    ? CRP 12/25/2016 7.9    Documentation Encounter on 01/22/2017   Component Date Value   ? Colonoscopy Comment 08/28/2006 Dr Terance Hart-     Office Visit on 01/15/2017   Component Date Value   ? CLINICAL INFORMATION: 01/15/2017 None given    ? LMP: 01/15/2017 None given    ? PREV. PAP: 01/15/2017 None given    ? PREV. BX: 01/15/2017 None given    ? SOURCE: 01/15/2017 Cervix    ? STATEMENT OF ADEQUACY: 01/15/2017 Satisfactory for evaluation. Endocervical/transformation zone component present.    ? INTERPRETATION/RESULT: 01/15/2017 Negative for intraepithelial lesion or malignancy.    ? COMMENT: 01/15/2017 This Pap test has been evaluated with computer assisted technology.    ? CYTOTECHNOLOGIST: 01/15/2017 NXB, CT(ASCP) CT screening location: [facility] [address] [place] [address]    ? HPV E6+E7 mRNA Cervix Ql* 01/15/2017 Not Detected    ? Colonoscopy Comment 06/14/2013 [facility] [place]    ? Fecal Occult Bld Spec #1 01/15/2017 negative        No results found.         ASSESSMENT :  Chronic migraine- first BOTOX March 2018- has reduced intensity and nausea associated with the headaches  Lupus- follows with rheumatology- back on Plaquenil       Plan: continue BOTOX for prevention chronic migraine  Re-trial of TOPAMAX ( did work well in the past)-   Start TOPAMAX 25mg  tablet   Week One: take 1 tab nightly   Week Two: 1 tab twice daily   Week Three: 1 tab AM and 2 tabs PM   Week Four on: 2 tabs twice daily   FIORICET 1-2 tabs at headache onset- counseled patient to not treat more then 2 headaches per week     Total face to facee time 15 minutes. 10 minutes was spent counseling patient on diagnosis, medications, alternative treatment, botox, labs, follow up care.     Nichole Colon, Nichole Colon  Department of Neurology

## 2017-04-01 NOTE — Patient Instructions
Continue BOTOX for chronic migraine     Start TOPAMAX TOPAMAX TITRATION:  Week One: take 1 tab nightly   Week Two: 1 tab twice daily   Week Three: 1 tab AM and 2 tabs PM   Week Four on: 2 tabs twice daily     For severe migraine, can take 2 tabs FIORICET at headache onset

## 2017-04-01 NOTE — Progress Notes
?   Clotting Disorder Maternal Grandmother    ? Heart Disease Maternal Grandmother    ? Hypertension Maternal Grandmother    ? Miscarriages / Stillbirths Maternal Grandmother    ? Vision Loss Maternal Grandmother        Social History     Social History   ? Marital status: Single     Spouse name: N/A   ? Number of children: N/A   ? Years of education: N/A     Occupational History   ? Not on file.     Social History Main Topics   ? Smoking status: Never Smoker   ? Smokeless tobacco: Never Used   ? Alcohol use No   ? Drug use: No   ? Sexual activity: Not Currently     Other Topics Concern   ? Not on file     Social History Narrative          Allergies   Allergen Reactions   ? No Known Drug Allergy      No Known Drug Allergies       Current Med List   Medication Sig   ? butalbital-acetaminophen-caffeine (FIORICET, ESGIC) 50-325-40 MG PO Tablet Take 1-2 tabs at headache onset. May repeat 1 tab in 6 hours if needed. Do not treat more then 2 headaches per week.   ? cyclobenzaprine (FLEXERIL) 10 MG PO Tablet Take 1 tablet by mouth every 8 hours as needed for muscle spasms.   ? cycloSPORINE (RESTASIS) 0.05 % OP Emulsion Place 1 drop into both eyes every 12 hours.   ? dilTIAZem (DILT-XR) 180 MG PO Capsule Extended Release 24 Hour TAKE 1 CAPSULE BY MOUTH DAILY   ? ergocalciferol (vitamin D-2) 50000 units PO Capsule TAKE 1 CAPSULE BY MOUTH 1 TIME A WEEK ON AN EMPTY STOMACH   ? gabapentin (NEURONTIN) 600 MG Tablet TAKE 1 TABLET BY MOUTH THREE TIMES DAILY   ? hydroCHLOROthiazide (HYDRODIURIL) 25 MG PO Tablet TAKE 1 TABLET BY MOUTH DAILY   ? hydroxychloroquine (PLAQUENIL) 200 MG PO Tablet Take 1 tablet by mouth 2 times daily.   ? pilocarpine (SALAGEN) 5 MG Tablet Take 1 tablet by mouth 2 times daily.   ? [DISCONTINUED] predniSONE (DELTASONE) 5 MG PO Tablet Take 1 tablet by mouth daily.   ? sertraline (ZOLOFT) 50 MG PO Tablet TAKE 1 TABLET BY MOUTH EVERY DAY   ? solifenacin (VESICARE) 5 MG PO Tablet Take 1 tablet by mouth daily.

## 2017-04-01 NOTE — Progress Notes
?   traMADol (ULTRAM) 50 MG PO Tablet Take 1 tablet by mouth every 8 hours as needed for pain.       REVIEW OF SYSTEMS: see neuro HPI . Constitution, eyes, ear, nose, mouth, throat, cardiovascular, gastrointestinal, genitourinary, hematologic, lymphatic, endocrine, musculoskeletal, and psychiatric systems were reviewed and were unremarkable, except for the aforementioned.      PHYSICAL EXAM:     Blood pressure 132/86, pulse 79, resp. rate 18, height 1.702 m (5' 7"), weight 92.5 kg (204 lb).    No exam preformed in lieu of counseling     DIAGNOSTIC STUDIES REVIEWED:  Autoreleased Orders on 02/26/2017   Component Date Value   ? WHITE BLOOD CELL COUNT 03/07/2017 4.7    ? RBC 03/07/2017 4.54    ? Hemoglobin 03/07/2017 12.8    ? Hematocrit 03/07/2017 39.2    ? MCV 03/07/2017 86.3    ? [place] 03/07/2017 28.2    ? MCHC 03/07/2017 32.7    ? RDW 03/07/2017 13.0    ? Platelets 03/07/2017 236    ? MPV 03/07/2017 9.6    ? Neutrophils Absolute 03/07/2017 2190    ? Lymphocytes Absolute 03/07/2017 1932    ? Monocytes Absolute 03/07/2017 385    ? Eosinophils Absolute 03/07/2017 174    ? Basophils Absolute 03/07/2017 19    ? Neutrophils 03/07/2017 46.6    ? LYMPHOCYTES 03/07/2017 41.1    ? MONOCYTES 03/07/2017 8.2    ? EOSINOPHILS 03/07/2017 3.7    ? BASOPHILS 03/07/2017 0.4    ? Glucose 03/07/2017 78    ? Urea Nitrogen 03/07/2017 11    ? Creatinine 03/07/2017 0.84    ? EGFR 03/07/2017 81    ? Glom Filt Rate, Est Afri* 03/07/2017 94    ? BUN/Creatinine Ratio 09/32/3557 NOT APPLICABLE    ? Sodium 03/07/2017 140    ? Potassium 03/07/2017 4.1    ? Chloride 03/07/2017 101    ? CARBON DIOXIDE 03/07/2017 25    ? Calcium 03/07/2017 9.7    ? Protein, Total 03/07/2017 7.4    ? ALBUMIN 03/07/2017 4.1    ? Globulin 03/07/2017 3.3    ? ALBUMIN/GLOBULIN RATIO 03/07/2017 1.2    ? Total Bilirubin 03/07/2017 0.3    ? Alkaline Phosphatase 03/07/2017 64    ? AST 03/07/2017 17    ? ALT 03/07/2017 11    ? C3 Complement 03/07/2017 121

## 2017-04-01 NOTE — Progress Notes
?   CO2 01/25/2017 22    ? Urea Nitrogen 01/25/2017 13    ? Creatinine 01/25/2017 0.71    ? BUN/Creatinine Ratio 01/25/2017 18.3    ? Glucose 01/25/2017 124*   ? Calcium 01/25/2017 10.0    ? Osmolality Calc 01/25/2017 284.7    ? Anion Gap 01/25/2017 17*   ? EGFR 01/25/2017 >59    ? WBC 01/25/2017 4.63    ? RBC 01/25/2017 4.80    ? Hemoglobin 01/25/2017 13.0    ? Hematocrit 01/25/2017 41.0    ? MCV 01/25/2017 85.4    ? [place] 01/25/2017 27.1    ? MCHC 01/25/2017 31.7    ? RDW 01/25/2017 13.3    ? Platelet Count 01/25/2017 231    ? MPV 01/25/2017 9.8    ? nRBC % 01/25/2017 0.0    ? Absolute NRBC Count 01/25/2017 0.00    ? Neutrophils % 01/25/2017 67.0    ? Lymphocytes % 01/25/2017 28.7    ? Monocytes % 01/25/2017 3.7    ? Eosinophils % 01/25/2017 0.0*   ? Immature Granulocytes % 01/25/2017 0.4    ? Neutrophils Absolute 01/25/2017 3.10    ? Lymphocytes Absolute 01/25/2017 1.33    ? Monocytes Absolute 01/25/2017 0.17    ? Eosinophils Absolute 01/25/2017 0.00    ? Basophil Absolute 01/25/2017 0.01    ? Basophils % 01/25/2017 0.2    ? Troponin I [employer]* 01/25/2017 <0.05    ? Color -Ur 01/25/2017 Colorless    ? Clarity, UA 01/25/2017 Clear    ? Specific Gravity, Urine 01/25/2017 1.005    ? pH, Urine 01/25/2017 8.0    ? Protein-UA 01/25/2017 Negative    ? Glucose -Ur 01/25/2017 Negative    ? Ketones UA 01/25/2017 Negative    ? Bilirubin -Ur 01/25/2017 Negative    ? Blood -Ur 01/25/2017 Negative    ? Nitrite -Ur 01/25/2017 Negative    ? Urobilinogen -Ur 01/25/2017 Normal    ? Leukocytes -Ur 01/25/2017 Negative    ? RBC -Ur 01/25/2017 3    ? WBC -Ur 01/25/2017 <1    ? Bacteria -Ur 01/25/2017 None seen    ? Mucus -Ur 01/25/2017 Rare    ? ASCORBIC ACID 01/25/2017 Negative    ? Troponin T 01/25/2017 <0.01    ? Troponin T 01/25/2017 <0.01    ? Color -Ur 01/25/2017 Yellow    ? Clarity, UA 01/25/2017 Clear    ? Spec Grav 01/25/2017 1.015    ? pH 01/25/2017 8.0*   ? Urobilinogen -Ur 01/25/2017 0.2

## 2017-04-01 NOTE — Progress Notes
negative workup.   Had BOTOX 02/21/17- tolerated procedure and does feel like it helps    She continues to have headaches on a daily basis however intensity is somewhat better and she no longer has nausea. She did another Prednisone cycle for 1 month for her LUPUS. Is back on PLAQUENIL ( was off x 1 month) and overall feels slightly better  Did not start Zonisamide  Fioricet 1 tab did not work well at aborting headache  Also has tramadol 50mg  which does not help     Past Medical History:   Diagnosis Date   ? Atrial fib/flutter, transient    ? Blood clot in vein    ? CAD (coronary artery disease)    ? Cerebral artery occlusion with cerebral infarction    ? COPD (chronic obstructive pulmonary disease)    ? Hyperlipemia    ? Hypertension    ? Lupus    ? Occasional tremors    ? Osteoarthritis    ? Personal history of noncompliance with medical treatment, presenting hazards to health    ? Pulmonary embolism    ? Sjogren's syndrome    ? TIA (transient ischemic attack)         Past Surgical History:   Procedure Laterality Date   ? CHOLECYSTECTOMY      C-Section   ? CHOLECYSTECTOMY      Surgery Description: Cholecystectomy;   (Created by Conversion)       Family History   Problem Relation Age of Onset   ? Asthma Mother    ? Heart Disease Mother    ? Hypertension Mother    ? Stroke Mother    ? Vision Loss Mother    ? Asthma Brother    ? Birth Defects Brother    ? Early Death Brother    ? Heart Disease Brother    ? High Cholesterol Brother    ? Hypertension Brother    ? Asthma Daughter    ? High Cholesterol Daughter    ? Hypertension Daughter    ? Asthma Maternal Aunt    ? Heart Disease Maternal Aunt    ? Hypertension Maternal Aunt    ? Miscarriages / Stillbirths Maternal Aunt    ? Stroke Maternal Aunt    ? Vision Loss Maternal Aunt    ? Heart Disease Maternal Uncle    ? Hypertension Maternal Uncle    ? Stroke Maternal Uncle    ? Arthritis Maternal Grandmother    ? Asthma Maternal Grandmother
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
## 2017-04-03 ENCOUNTER — Ambulatory Visit: Attending: Internal Medicine | Primary: Internal Medicine

## 2017-04-03 DIAGNOSIS — I10 Essential (primary) hypertension: Principal | ICD-10-CM

## 2017-04-03 DIAGNOSIS — I8290 Acute embolism and thrombosis of unspecified vein: Secondary | ICD-10-CM

## 2017-04-03 DIAGNOSIS — Z9119 Patient's noncompliance with other medical treatment and regimen: Secondary | ICD-10-CM

## 2017-04-03 DIAGNOSIS — M170 Bilateral primary osteoarthritis of knee: Secondary | ICD-10-CM

## 2017-04-03 DIAGNOSIS — M329 Systemic lupus erythematosus, unspecified: Secondary | ICD-10-CM

## 2017-04-03 DIAGNOSIS — E559 Vitamin D deficiency, unspecified: Secondary | ICD-10-CM

## 2017-04-03 DIAGNOSIS — M7061 Trochanteric bursitis, right hip: Secondary | ICD-10-CM

## 2017-04-03 DIAGNOSIS — M3500 Sicca syndrome, unspecified: Secondary | ICD-10-CM

## 2017-04-03 DIAGNOSIS — I2699 Other pulmonary embolism without acute cor pulmonale: Secondary | ICD-10-CM

## 2017-04-03 DIAGNOSIS — M3219 Other organ or system involvement in systemic lupus erythematosus: Principal | ICD-10-CM

## 2017-04-03 DIAGNOSIS — R251 Tremor, unspecified: Secondary | ICD-10-CM

## 2017-04-03 DIAGNOSIS — I6350 Cerebral infarction due to unspecified occlusion or stenosis of unspecified cerebral artery: Secondary | ICD-10-CM

## 2017-04-03 DIAGNOSIS — M1710 Unilateral primary osteoarthritis, unspecified knee: Secondary | ICD-10-CM

## 2017-04-03 DIAGNOSIS — M797 Fibromyalgia: Secondary | ICD-10-CM

## 2017-04-03 DIAGNOSIS — G459 Transient cerebral ischemic attack, unspecified: Secondary | ICD-10-CM

## 2017-04-03 DIAGNOSIS — M1990 Unspecified osteoarthritis, unspecified site: Secondary | ICD-10-CM

## 2017-04-03 DIAGNOSIS — J449 Chronic obstructive pulmonary disease, unspecified: Secondary | ICD-10-CM

## 2017-04-03 DIAGNOSIS — I2510 Atherosclerotic heart disease of native coronary artery without angina pectoris: Secondary | ICD-10-CM

## 2017-04-03 DIAGNOSIS — G894 Chronic pain syndrome: Principal | ICD-10-CM

## 2017-04-03 DIAGNOSIS — IMO0002 Atrial fib/flutter, transient: Secondary | ICD-10-CM

## 2017-04-03 DIAGNOSIS — E785 Hyperlipidemia, unspecified: Secondary | ICD-10-CM

## 2017-04-03 MED ORDER — GABAPENTIN 600 MG PO TABS
0 refills | Status: CP
Start: 2017-04-03 — End: 2017-07-17

## 2017-04-03 MED ORDER — TRAMADOL HCL 50 MG PO TABS
50 mg | Freq: Three times a day (TID) | ORAL | 0 refills | Status: CP | PRN
Start: 2017-04-03 — End: 2018-01-21

## 2017-04-03 MED ORDER — DICLOFENAC SODIUM 1 % TD GEL
4 g | Freq: Four times a day (QID) | TOPICAL | 2 refills | Status: CP
Start: 2017-04-03 — End: 2017-06-03

## 2017-04-03 NOTE — Progress Notes
?   Asthma Maternal Grandmother    ? Clotting Disorder Maternal Grandmother    ? Heart Disease Maternal Grandmother    ? Hypertension Maternal Grandmother    ? Miscarriages / Stillbirths Maternal Grandmother    ? Vision Loss Maternal Grandmother        Social History:     Social History     Social History   ? Marital status: Single     Spouse name: N/A   ? Number of children: N/A   ? Years of education: N/A     Social History Main Topics   ? Smoking status: Never Smoker   ? Smokeless tobacco: Never Used   ? Alcohol use No   ? Drug use: No   ? Sexual activity: Not Currently     Other Topics Concern   ? None     Social History Narrative       Physical Exam:   BP 133/81 - Pulse 79 - Temp 37.2 ?C (99 ?F) - Resp 14 - Ht 1.702 m (5\' 7" ) - Wt 90.1 kg (198 lb 9.6 oz) - BMI 31.11 kg/m2      General appearance  alert, cooperative, no distress, appears stated age, obese    Psychiatric/Behavioral:  Negative for behavioral problems, confusion and agitation.          Head  Normocephalic, without obvious abnormality, no scalp lesion  normal hair distribution   Eyes  conjunctivae/corneas clear. PERTL, EOMI    HEENT Lips, mucosa very  dry  .no oral ulceration   Neck supple, symmetrical, trachea midline, no adenopathy,thyroid: not enlarged,  no JVD   Back   Normal posture, alignment  +  tenderness to palpation of spinous processes.? muscle spasm in neck and trapezius   ?   Lungs   Not tachypnea . Normal respiratory effort  clear to auscultation bilaterally       Heart  regular rate and rhythm, S1, S2 normal, no murmur, click, rub or gallop   Abdomen   soft, non-tender. Bowel sounds normal. No masses,  No organomegaly   MSK No objective  synovitis , dactylitis in complete exam.  FMS tender point 16/18*  Significant tender on right trochenteric bursa    Extremities extremities normal, atraumatic, no cyanosis or edema   Pulses 2+ and symmetric   Skin Skin color, texture, turgor normal. No rashes or lesions

## 2017-04-03 NOTE — Progress Notes
complaints during that time or any surgical procedure. Per pt she still has not see the Ophthalmologist and ran out of her plaquenil about 1 week ago. She also has complaints of continued Sicca symptoms as well as sharp shooting pains in her upper back and shoulders for the past day.    01/29/17   C/o fatigue and pain all over . We stopped Plaquenil last visit because no eye exam . Now she was seen  Recently and no maculopathy .  Her treatment for fibromyalgia is adjused in last visit .     04/03/17  RTC for SLE ,   Pred 5mg  daily for 1 month done and had labs after  Which seems better . No leucopenia, complements normal . Lower DsDNA . C/o pain and weight gain . Pain especially in knees and right lateral hip.  Other ROS negative except fatigue weight gain and joint pain     The pain is At   Knees ,  Can't sleep sometimes  no swelling Morning stiffness and tender + . Right hip , ( lateral thigh ) can't walk well , can't sleep on that side   Patient describes the level of pain as  7 of 10.   The pain was first noticed    6 Years     It usually lasts  * always there.   When is the problem worst?  No specific time   Does anything make the problem worse?  moving around, walking ,standing , driving ,lying on my side   Does anything help or make the problem better?no     CONSTITUTIONAL SYMPTOMS:No Fever, Weight Loss or Night Sweats , +++fatigue    Review of systems for connective tissue disease revealed:   No Photosensitivity,malar rash ,discoid rash , hair loss, mouth ulcer , dysphagia, dyspnea, chest pain,SOB,  abdominal pain, blood in stool ,easy bruising  paraesthesia, weakness or Raynaud's  + nausea and heart burn     + sicca     ROS FOR FMS: Patient is reviewed for fibromyalgia symptoms.  Sleep difficulty +   Exercise :  None.      Associated symptoms:   Migraine headaches.    Irritable bowel symptoms.  ,Depression:      Review Of Systems: All other ROS were reviewed and were unremarkable  Outpatient Medications:

## 2017-04-03 NOTE — Progress Notes
-   Baseline HVF (10-2 white SITA): needs to be performed                        - Baseline SD OCT 12/2016: wnl OU                         - recommend annual screening with repeat testing after 5 years of exposure  - discussed risk of toxicity and possible signs/symptoms with patient  - continue per primary/rheumatology    ASSESSMENT:     Problem List Items Addressed This Visit     Systemic lupus erythematosus    Osteoarthrosis involving lower leg    Relevant Medications    traMADol (ULTRAM) 50 MG PO Tablet    Fibromyalgia    Relevant Medications    traMADol (ULTRAM) 50 MG PO Tablet      Other Visit Diagnoses     Hypovitaminosis D              PLAN:     Counseling was given regarding impression.     Sjogren's /SLE: She has Pos ANA 1:1280 in 2012. Previously treated with PLa and SSZ at [facility] .  Now repeat labs consistent with  + SSA , negative SSB, mildly low C4 and low level pos anti Ds DNA   Negative RF and ant CCP  Pt counseled on the need for regular Dental Appointments every 6 months.   Patient was just seen by opthalmology for dry eye and plaquenil eye exam ( 01/2017)   Her DS DNA is higher , normal complements  And leucopenia in Jan labs which is better now   She also has generalized fatigue and severe sicca /.  Will restart Plaquenil   She is currently on prednisone by other provider  For short course  Follow up in 2 months with prior labs     # Low Vit D level  : still on weekly booster   # Right trochentric bursitsi ; injection given .       FMS: Reports poor sleep with trouble falling asleep and staying asleep as well as daytime fatigue.  Pt counseled on need for better sleep hygiene including setting a scheduled bedtime, no day time napping, appropriate bedroom environment as well scheduled exercise program ( a least 30 mins of activity daily)  Currently on gabapentin and Zoloft, flexeril   Refill tramadol prn       Myint Loni Beckwith, MD   PROCEDURE NOTE

## 2017-04-03 NOTE — Progress Notes
INJECTION   OF    TROCHANTERIC BURSA :   Right  DATE: 04/03/2017  INDICATION:  Lateral Hip Pain, Trochanteric Bursitiis         Informed consent was obtained. Time out was observed to verify correct side of procedure.    The skin overlying the lateral hip was cleaned with  Chlorhexidine and isopropyl alcohol.     The most tender area over the  Right lateral hip was identified prior to cleaning.  Using a   22G 2inch needle , this point was injected in a fan like fashion with  40 mg Depomedrol mixed with 2 cc 2% lidocaine  without any complications.  The patient reported 80   % pain relief following the injection.        The patient was warned to rest up the  joint for the rest of the day and also to ice it every 2 hours. Potential of initial flareup of pain was discussed. Instructions were given to call if the patient experiences any problems.    Myint Loni Beckwith, MD

## 2017-04-03 NOTE — Progress Notes
needed for pain. 01/29/17 04/03/17 Yes Thway, Myint Vic Blackbird, MD   prednisoLONE acetate (PRED FORTE) 1 % OP Suspension Place 1 drop into both eyes 4 times daily for 14 days. 01/14/17 01/28/17  Verdene Lennert, MD   zaleplon (SONATA) 5 MG PO Capsule Take 1 capsule by mouth nightly at bedtime for 2 doses. 02/17/17 02/19/17  Orlean Patten, MD       Allergies:  Allergies   Allergen Reactions   ? No Known Drug Allergy      No Known Drug Allergies       PMH:   Past Medical History:   Diagnosis Date   ? Atrial fib/flutter, transient    ? Blood clot in vein    ? CAD (coronary artery disease)    ? Cerebral artery occlusion with cerebral infarction    ? COPD (chronic obstructive pulmonary disease)    ? Hyperlipemia    ? Hypertension    ? Lupus    ? Occasional tremors    ? Osteoarthritis    ? Personal history of noncompliance with medical treatment, presenting hazards to health    ? Pulmonary embolism    ? Sjogren's syndrome    ? TIA (transient ischemic attack)        PSH:  Past Surgical History:   Procedure Laterality Date   ? CHOLECYSTECTOMY      C-Section   ? CHOLECYSTECTOMY      Surgery Description: Cholecystectomy;   (Created by Conversion)       Family History:   No Rheumatological family history    Family History   Problem Relation Age of Onset   ? Asthma Mother    ? Heart Disease Mother    ? Hypertension Mother    ? Stroke Mother    ? Vision Loss Mother    ? Asthma Brother    ? Birth Defects Brother    ? Early Death Brother    ? Heart Disease Brother    ? High Cholesterol Brother    ? Hypertension Brother    ? Asthma Daughter    ? High Cholesterol Daughter    ? Hypertension Daughter    ? Asthma Maternal Aunt    ? Heart Disease Maternal Aunt    ? Hypertension Maternal Aunt    ? Miscarriages / Stillbirths Maternal Aunt    ? Stroke Maternal Aunt    ? Vision Loss Maternal Aunt    ? Heart Disease Maternal Uncle    ? Hypertension Maternal Uncle    ? Stroke Maternal Uncle    ? Arthritis Maternal Grandmother

## 2017-04-03 NOTE — Progress Notes
WBC -Ur NONE SEEN < OR = 5 /HPF    RBC -Ur NONE SEEN < OR = 2 /HPF    Squam Epithel, UA NONE SEEN < OR = 5 /HPF    BACTERIA NONE SEEN NONE SEEN /HPF    Hyaline Casts, UA NONE SEEN NONE SEEN /LPF   Anti DNA, Double Stranded   Result Value Ref Range    dsDNA Ab 24 (H) IU/mL   CBC and Differential (All Labs)   Result Value Ref Range    WHITE BLOOD CELL COUNT 4.7 3.8 - 10.8 Thousand/uL    RBC 4.54 3.80 - 5.10 Million/uL    Hemoglobin 12.8 11.7 - 15.5 g/dL    Hematocrit 39.2 35.0 - 45.0 %    MCV 86.3 80.0 - 100.0 fL    MCH 28.2 27.0 - 33.0 pg    MCHC 32.7 32.0 - 36.0 g/dL    RDW 13.0 11.0 - 15.0 %    Platelets 236 140 - 400 Thousand/uL    MPV 9.6 7.5 - 12.5 fL    Neutrophils Absolute 2190 1500 - 7800 cells/uL    Lymphocytes Absolute 1932 850 - 3900 cells/uL    Monocytes Absolute 385 200 - 950 cells/uL    Eosinophils Absolute 174 15 - 500 cells/uL    Basophils Absolute 19 0 - 200 cells/uL    Neutrophils 46.6 %    LYMPHOCYTES 41.1 %    MONOCYTES 8.2 %    EOSINOPHILS 3.7 %    BASOPHILS 0.4 %       [facility] -Last visit march 30  SLE Dx 2010 , anti SSA, SSB + , borderline anti ds dna, diffuse arthralgia since 2007  FMS  Complex migraine  But in 2010 labs RF 25, SSA 118, SSB 26, Ds DNA 200 - overall mild disease  Ineffecive response to Pla   Rx SSZ 1000mg  bid (started 3/15)  Lack of response to tramadol  Gabapentin 600mg  tid  Pred short taper   MRI lumbar spine -NO sig   One episode of WBC 3.7       Radiology:    05/27/15  [facility]   Electrophysiologic findings:  1. Normal sural sensory conduction studies bilaterally.  2. Absent superficial peroneal sensory responses bilaterally. The symmetric absence responses a normal finding.  3. Reduced amplitude left tibial motor response with a normal distal latency. The right tibial motor conduction was normal.  4. Normal common peroneal motor conductions bilaterally.  5. Absent common peroneal F-wave responses bilaterally. The symmetric

## 2017-04-03 NOTE — Progress Notes
Lymph nodes Cervical, supraclavicular, and axillary nodes normal.   Neurologic MENTAL STATUS: Alert. Oriented to person, place, and time. Intact recent and remote memory. No aphasia.   CRANIAL NERVES: Cranial nerves II-XII were normal   No motor /sensory deficit        Lab or Diagnostic Testing:     Results for orders placed or performed in visit on 02/26/17   Comprehensive Metabolic Panel (All Labs)   Result Value Ref Range    Glucose 78 65 - 99 mg/dL    Urea Nitrogen 11 7 - 25 mg/dL    Creatinine 0.84 0.50 - 1.05 mg/dL    EGFR 81 > OR = 60 mL/min/1.4m    Glom Filt Rate, Est African American 94 > OR = 60 mL/min/1.797m   BUN/Creatinine Ratio NOT APPLICABLE 6 - 22 (calc)    Sodium 140 135 - 146 mmol/L    Potassium 4.1 3.5 - 5.3 mmol/L    Chloride 101 98 - 110 mmol/L    CARBON DIOXIDE 25 20 - 31 mmol/L    Calcium 9.7 8.6 - 10.4 mg/dL    Protein, Total 7.4 6.1 - 8.1 g/dL    ALBUMIN 4.1 3.6 - 5.1 g/dL    Globulin 3.3 1.9 - 3.7 g/dL (calc)    ALBUMIN/GLOBULIN RATIO 1.2 1.0 - 2.5 (calc)    Total Bilirubin 0.3 0.2 - 1.2 mg/dL    Alkaline Phosphatase 64 33 - 130 U/L    AST 17 10 - 35 U/L    ALT 11 6 - 29 U/L   C3 Complement (All Labs)   Result Value Ref Range    C3 Complement 121 83 - 193 mg/dL   C4 Complement (All Labs)   Result Value Ref Range    C4 Complement 19 15 - 57 mg/dL   Sedimentation Rate ESR (All Labs)   Result Value Ref Range    SED RATE BY MODIFIED WESTERGREN 14 < OR = 20 mm/h   25-HYDROXY VITAMIN D   Result Value Ref Range    Vit D, 25-Hydroxy 25 (L) 30 - 100 ng/mL   Urinalysis W Microscopy ([facility])   Result Value Ref Range    Color -Ur YELLOW YELLOW    APPEARANCE CLEAR CLEAR    Specific Gravity -Ur 1.017 1.001 - 1.035    pH -Ur 6.5 5.0 - 8.0    Glucose -Ur NEGATIVE NEGATIVE    Bilirubin -Ur NEGATIVE NEGATIVE    Ketones UA NEGATIVE NEGATIVE    Hb -Ur NEGATIVE NEGATIVE    Protein-UA NEGATIVE NEGATIVE    NITRITE NEGATIVE NEGATIVE    Leukocyte Esterase -Ur NEGATIVE NEGATIVE

## 2017-04-03 NOTE — Progress Notes
absence of responses is sometimes a normal finding and female patients.  6. Prolonged right tibial F-wave latency. The left tibial F-wave latency was normal.  7. Prolonged tibial H reflex responses bilaterally.  ?  Needle EMG: EMG in the left leg was normal in the tibialis anterior and gastrocnemius muscles.  ?  Impression: Electrophysiologic study was abnormal with findings that seem to be most consistent with mild lumbosacral radicular dysfunction as evidenced by the prolonged late responses, low amplitude distal motor response but normal sensory conductions. The absence of any EMG abnormality indicates that there is not significant motor axon loss associated. We did not find any evidence here to suggest that there is a large fiber peripheral neuropathy. Her symptoms do not seem to be consistent with a small fiber neuropathy    Eye exam (01/2017)     Glaucoma suspect, bilateral  - (+) family history: mother, mGm, mAunt   - African American   - HVF 24-2 01/2017: possible bilateral nasal defects with rim artifact OS  - IOP has been wnl, continue to observe for now      Sjogren's syndrome with keratoconjunctivitis sicca  - continue follow up with rheumatology  - recommend artificial tears QID OU  - discussed use of preservative free artificial tears if using more frequently   - discussed minimizing environmental factors    ?  Long-term use of Plaquenil  - currently off plaquenil   - no evidence of maculopathy at this time   - patient currently on 400 mg Plaquenil x 4-5 years  - real body weight: 81.6 kg and daily dose 4.9 mg/kg  - Patient's major risk factors for toxicity:                         - >5.0 mg/kg real weight: no                        - >5 years of use: almost, 5 years per history                        - concurrent renal disease: no                        - concurrent tamoxifen use: no                        - pre-existing macular disease: no  - Screening

## 2017-04-03 NOTE — Progress Notes
Department of Rheumatology    Patient: Nichole Colon  MRN: [id number]  DOB: 04-15-1966      Chief Complaint:  Follow-up (6 Weeks)    Patient Active Problem List   Diagnosis   ? Plantar fascial fibromatosis   ? Systemic lupus erythematosus   ? Osteoarthrosis involving lower leg   ? Other disorder of menstruation and other abnormal bleeding from female genital tract   ? Unspecified transient cerebral ischemia   ? Other chronic pain   ? Other malaise and fatigue   ? Allergy, unspecified not elsewhere classified   ? Chronic airway obstruction, not elsewhere classified   ? Essential hypertension   ? Gonococcal infection (acute) of lower genitourinary tract   ? Other and unspecified hyperlipidemia   ? Sjogrens syndrome   ? Fibromyalgia   ? Migraine without aura and without status migrainosus, not intractable   ? Chronic bilateral low back pain without sciatica   ? OAB (overactive bladder)   ? Body mass index 28.0-28.9, adult   ? Chest pain   ? Esophageal dysphagia       History of Present Illness:     Nichole Colon is an 51 y.o. AA female who is here for follow up   Sjogren's  Disease and fibromyalgia .    Past history   She has been following  with Dr Barrie Folk for several years and established Dx of lupus/Sjogren's, Fibromyalgia and OA around 2010  .   Her last visit  With him was 03/14/14 until insurance changed   She was treated with Plaquenil then switched to SSZ . She was on SSZ 2 months and stopped because of nausea . Plq also gave her dry skin and it was stopped .  She was not on any med in her first visit here 03/06/15  except for FMS.  Neurontin, baclofen and mobic prn   I also received notes from [facility]   They Dx SLE (2010) anti SSA, SSB  + , borderline Ds DNA , negative APL study   She was restarted PLq and salagen (03/30/2015)  .    12/24/16   She is here for follow up. Last seen March 2017. Reports that she was hospitalized 2 times since last being seen for her IBS-. Denies any other

## 2017-04-03 NOTE — Progress Notes
Current Outpatient Medications    Medication Sig Start Date End Date Taking? Authorizing Provider   butalbital-acetaminophen-caffeine (FIORICET, ESGIC) 50-325-40 MG PO Tablet Take 1-2 tabs at headache onset. May repeat 1 tab in 6 hours if needed. Do not treat more then 2 headaches per week. 01/21/17  Yes [name], ARNP   cyclobenzaprine (FLEXERIL) 10 MG PO Tablet Take 1 tablet by mouth every 8 hours as needed for muscle spasms. 01/29/17  Yes Thway, Myint Myat, MD   cycloSPORINE (RESTASIS) 0.05 % OP Emulsion Place 1 drop into both eyes every 12 hours. 01/13/17  Yes Liam Rogers, MD   dilTIAZem (DILT-XR) 180 MG PO Capsule Extended Release 24 Hour TAKE 1 CAPSULE BY MOUTH DAILY 01/15/17  Yes Gretchen Short, MD   ergocalciferol (vitamin D-2) 50000 units PO Capsule TAKE 1 CAPSULE BY MOUTH 1 TIME A WEEK ON AN EMPTY STOMACH 01/15/17  Yes Gretchen Short, MD   gabapentin (NEURONTIN) 600 MG Tablet TAKE 1 TABLET BY MOUTH THREE TIMES DAILY 02/12/16  Yes Gretchen Short, MD   hydroCHLOROthiazide (HYDRODIURIL) 25 MG PO Tablet TAKE 1 TABLET BY MOUTH DAILY 01/15/17  Yes Gretchen Short, MD   hydroxychloroquine (PLAQUENIL) 200 MG PO Tablet Take 1 tablet by mouth 2 times daily. 01/29/17  Yes Thway, Myint Myat, MD   pilocarpine (SALAGEN) 5 MG Tablet Take 1 tablet by mouth 2 times daily. 02/20/16  Yes Thway, Myint Myat, MD   sertraline (ZOLOFT) 50 MG PO Tablet TAKE 1 TABLET BY MOUTH EVERY DAY 01/15/17  Yes Gretchen Short, MD   solifenacin (VESICARE) 5 MG PO Tablet Take 1 tablet by mouth daily. 01/15/17  Yes Gretchen Short, MD   topiramate (TOPAMAX) 25 MG PO Capsule Sprinkle Take 1 caps nightly x 1 week then 1 caps twice daily x 1 week then 1 caps AM and 2 caps PM x 1 week then 2 caps twice daily 04/01/17  Yes [name], ARNP   traMADol (ULTRAM) 50 MG PO Tablet Take 1 tablet by mouth every 8 hours as needed for pain. 04/03/17  Yes Thway, Myint Myat, MD   traMADol (ULTRAM) 50 MG PO Tablet Take 1 tablet by mouth every 8 hours as

## 2017-04-07 ENCOUNTER — Encounter: Attending: Trauma Surgery | Primary: Internal Medicine

## 2017-04-09 ENCOUNTER — Ambulatory Visit: Attending: Family Medicine | Primary: Internal Medicine

## 2017-04-09 DIAGNOSIS — IMO0002 Atrial fib/flutter, transient: Secondary | ICD-10-CM

## 2017-04-09 DIAGNOSIS — F5104 Psychophysiologic insomnia: Principal | ICD-10-CM

## 2017-04-09 DIAGNOSIS — I2699 Other pulmonary embolism without acute cor pulmonale: Secondary | ICD-10-CM

## 2017-04-09 DIAGNOSIS — Z9119 Patient's noncompliance with other medical treatment and regimen: Secondary | ICD-10-CM

## 2017-04-09 DIAGNOSIS — M1990 Unspecified osteoarthritis, unspecified site: Secondary | ICD-10-CM

## 2017-04-09 DIAGNOSIS — I10 Essential (primary) hypertension: Principal | ICD-10-CM

## 2017-04-09 DIAGNOSIS — E785 Hyperlipidemia, unspecified: Secondary | ICD-10-CM

## 2017-04-09 DIAGNOSIS — M329 Systemic lupus erythematosus, unspecified: Secondary | ICD-10-CM

## 2017-04-09 DIAGNOSIS — G478 Other sleep disorders: Secondary | ICD-10-CM

## 2017-04-09 DIAGNOSIS — R251 Tremor, unspecified: Secondary | ICD-10-CM

## 2017-04-09 DIAGNOSIS — J449 Chronic obstructive pulmonary disease, unspecified: Secondary | ICD-10-CM

## 2017-04-09 DIAGNOSIS — J309 Allergic rhinitis, unspecified: Secondary | ICD-10-CM

## 2017-04-09 DIAGNOSIS — M3500 Sicca syndrome, unspecified: Secondary | ICD-10-CM

## 2017-04-09 DIAGNOSIS — I2510 Atherosclerotic heart disease of native coronary artery without angina pectoris: Secondary | ICD-10-CM

## 2017-04-09 DIAGNOSIS — G459 Transient cerebral ischemic attack, unspecified: Secondary | ICD-10-CM

## 2017-04-09 DIAGNOSIS — I6350 Cerebral infarction due to unspecified occlusion or stenosis of unspecified cerebral artery: Secondary | ICD-10-CM

## 2017-04-09 DIAGNOSIS — I8290 Acute embolism and thrombosis of unspecified vein: Secondary | ICD-10-CM

## 2017-04-09 MED ORDER — TRAZODONE HCL 50 MG PO TABS
50 mg | Freq: Every evening | ORAL | 2 refills | Status: CP
Start: 2017-04-09 — End: 2018-06-17

## 2017-04-09 NOTE — Progress Notes
?   Migraine without aura and without status migrainosus, not intractable   ? Chronic bilateral low back pain without sciatica   ? OAB (overactive bladder)   ? Body mass index 28.0-28.9, adult   ? Chest pain   ? Esophageal dysphagia       Past Surgical History:   Procedure Laterality Date   ? CHOLECYSTECTOMY      C-Section   ? CHOLECYSTECTOMY      Surgery Description: Cholecystectomy;   (Created by Conversion)       Allergies   Allergen Reactions   ? No Known Drug Allergy      No Known Drug Allergies       Current Outpatient Medications    Medication Sig Start Date End Date Taking? Authorizing Provider   butalbital-acetaminophen-caffeine (FIORICET, ESGIC) 50-325-40 MG PO Tablet Take 1-2 tabs at headache onset. May repeat 1 tab in 6 hours if needed. Do not treat more then 2 headaches per week. 01/21/17  Yes [name], ARNP   cyclobenzaprine (FLEXERIL) 10 MG PO Tablet Take 1 tablet by mouth every 8 hours as needed for muscle spasms. 01/29/17  Yes Thway, Myint Myat, MD   cycloSPORINE (RESTASIS) 0.05 % OP Emulsion Place 1 drop into both eyes every 12 hours. 01/13/17  Yes Liam Rogers, MD   diclofenac (VOLTAREN GEL) 1 % TD Gel Apply 4 g topically 4 times daily. 04/03/17  Yes Thway, Myint Myat, MD   dilTIAZem (DILT-XR) 180 MG PO Capsule Extended Release 24 Hour TAKE 1 CAPSULE BY MOUTH DAILY 01/15/17  Yes Gretchen Short, MD   ergocalciferol (vitamin D-2) 50000 units PO Capsule TAKE 1 CAPSULE BY MOUTH 1 TIME A WEEK ON AN EMPTY STOMACH 01/15/17  Yes Gretchen Short, MD   gabapentin (NEURONTIN) 600 MG PO Tablet TAKE 1 TABLET BY MOUTH THREE TIMES DAILY 04/03/17  Yes Gretchen Short, MD   hydroCHLOROthiazide (HYDRODIURIL) 25 MG PO Tablet TAKE 1 TABLET BY MOUTH DAILY 01/15/17  Yes Gretchen Short, MD   hydroxychloroquine (PLAQUENIL) 200 MG PO Tablet Take 1 tablet by mouth 2 times daily. 01/29/17  Yes Thway, Myint Myat, MD   pilocarpine (SALAGEN) 5 MG Tablet Take 1 tablet by mouth 2 times daily. 02/20/16  Yes Thway, Arlyss Gandy, MD

## 2017-04-09 NOTE — Progress Notes
Occupational History   ? Not on file.     Social History Main Topics   ? Smoking status: Never Smoker   ? Smokeless tobacco: Never Used   ? Alcohol use No   ? Drug use: No   ? Sexual activity: Not Currently     Other Topics Concern   ? Not on file     Social History Narrative       REVIEW OF SYSTEMS:  Constitutional: Positive sleepiness and fatigue. No unintentional weight loss reported.  Ears, nose and throat: As per HPI.  Respiratory: No shortness of breath or wheezing. Otherwise as per HPI.  Musculoskeletal: No functional deficits or obvious deformities.  Neuro: No obvious deficits. Otherwise as per HPI..  Skin: No rashes.  Allergic: As per allergy profile.    Vitals:    04/09/17 1449   BP: (!) 140/92   Pulse: 63   Weight: 90.5 kg (199 lb 9.6 oz)   Height: 1.702 m (5\' 7" )     Body mass index is 31.26 kg/(m^2).    PHYSICAL EXAM:  GENERAL: Well groomed. Appears stated age.  HEENT: Normocephalic, atraumatic. No conjunctival erythema. No scleral icterus. External ears symmetric without lesions or deformity. No retro- or micrognathia.   LUNGS: Unlabored breathing. Symmetric chest wall movement.  EXTREMITIES: No clubbing cyanosis or edema.  MUSCULOSKELETAL: Normal mobility. No deformities.  NEURO: Alert, oriented x 3. No obvious focal deficits. Cranial nerves II-XII grossly intact.  SKIN: No rashes. Normal turgor.  PSYCH: Appropriate affect. Well directed speech. Maintained eye contact.    ASSESSMENT/PLAN:       1. 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  - Discussed optimizing #3, see below.  - Advised positional to lateral therapy ("tennis ball technique" or "slumberbump")  - Encouraged weight loss.  - Return for re-evaluation if symptoms progress.    2. CHRONIC INSOMNIA.  - Annelle Behrendt was educated on age appropriate sleep duration recommendations according to AASM guidelines while putting accent on individual sleep need variability.

## 2017-04-09 NOTE — Progress Notes
sertraline (ZOLOFT) 50 MG PO Tablet TAKE 1 TABLET BY MOUTH EVERY DAY 01/15/17  Yes Gretchen Short, MD   solifenacin (VESICARE) 5 MG PO Tablet Take 1 tablet by mouth daily. 01/15/17  Yes Gretchen Short, MD   topiramate (TOPAMAX) 25 MG PO Capsule Sprinkle Take 1 caps nightly x 1 week then 1 caps twice daily x 1 week then 1 caps AM and 2 caps PM x 1 week then 2 caps twice daily 04/01/17  Yes [name], ARNP   traMADol (ULTRAM) 50 MG PO Tablet Take 1 tablet by mouth every 8 hours as needed for pain. 04/03/17  Yes Thway, Myint Vic Blackbird, MD   prednisoLONE acetate (PRED FORTE) 1 % OP Suspension Place 1 drop into both eyes 4 times daily for 14 days. 01/14/17 01/28/17  Verdene Lennert, MD   zaleplon (SONATA) 5 MG PO Capsule Take 1 capsule by mouth nightly at bedtime for 2 doses. 02/17/17 02/19/17  Orlean Patten, MD       Family History   Problem Relation Age of Onset   ? Asthma Mother    ? Heart Disease Mother    ? Hypertension Mother    ? Stroke Mother    ? Vision Loss Mother    ? Asthma Brother    ? Birth Defects Brother    ? Early Death Brother    ? Heart Disease Brother    ? High Cholesterol Brother    ? Hypertension Brother    ? Asthma Daughter    ? High Cholesterol Daughter    ? Hypertension Daughter    ? Asthma Maternal Aunt    ? Heart Disease Maternal Aunt    ? Hypertension Maternal Aunt    ? Miscarriages / Stillbirths Maternal Aunt    ? Stroke Maternal Aunt    ? Vision Loss Maternal Aunt    ? Heart Disease Maternal Uncle    ? Hypertension Maternal Uncle    ? Stroke Maternal Uncle    ? Arthritis Maternal Grandmother    ? Asthma Maternal Grandmother    ? Clotting Disorder Maternal Grandmother    ? Heart Disease Maternal Grandmother    ? Hypertension Maternal Grandmother    ? Miscarriages / Stillbirths Maternal Grandmother    ? Vision Loss Maternal Grandmother        Social History     Social History   ? Marital status: Single     Spouse name: N/A   ? Number of children: N/A   ? Years of education: N/A

## 2017-04-09 NOTE — Progress Notes
-   She was counseled on "stimulus control" and "sleep restriction" technique today.  - We discussed safety profile of various sleep aids and risk/benefit ration involved in such therapy. We discussed long term efficacy pharmacotherapy versus CBTi, with latter providing comparable or superior outcomes compared to that of pharmacotherapy.  - Trial with trazodone 50 mg QHS prescribed along: # 3 month supply.  Nichole Colon was provided with a template for sleep diary and will bring 2 week sleep logs to the followup appointment.  - Followup in 3 months.  - The patient was educated on risks of driving or operating machinery while sleepy, including injury and death and indicated good understanding and agreement as to not drive while sleepy.    3. CHRONIC ALLERGIC RHINITIS.  - Advised use of intranasal steroid therapy (such as Flonase); discussed importance of consistent use considering its cumulative effect.  - Encouraged consistent nasal saline irrigation prior to application of intranasal sprays.  - Systemic antihistamines (such as Zyrtec) can be used in addition.  - Discussed mutual relationship between allergic rhinitis (upper airway resistance) and sleep disordered breathing.     4. OTHER COMORBIDITIES. As per treating clinicians.    DISPOSITION: Nichole Colon will return to the sleep clinic in 3 months for follow-up on insomnia; sooner as/if indicated.   The patient verbalized understanding and agreement with the above and was encouraged to contact me with any questions/concerns in the interim.    This was a level 4 visit with total of 25 out 30 minutes of direct face to face visit time spent counseling with the patient focusing on nature of disease, treatment options/side effects and disposition.

## 2017-04-09 NOTE — Progress Notes
SLEEP MEDICINE FOLLOW-UP    REASON FOR VISIT:  Follow-up on sleep study and insomnia.    HISTORY OF PRESENT ILLNESS:  Nichole Colon is a 51 y.o. female  who was seen in original sleep consultation for the above concerns.  Her predominant sleep-related concern was that of insomnia and poor quality of sleep coupled with poor daytime functioning (fatigue and sleepiness).  ?  She underwent polysomnography on 03/14/2017.  Disordered breathing profile was suggestive of no clinically significant obstructive sleep apnea or hypopnea overall with AHI of 3.0, however, with mild upper airway resistance syndrome with RDI of 5.9.  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  Her REM nonsupine AHI was only mildly elevated at 7.7, however.  No clinically significant sleep-related hypoxia noted.  No clinically significant excessive leg movements of sleep noted.    Today, she claims that she has given up her daytime naps. Her sleep schedule at current is such that she goes to bed around 9 PM to finally fall asleep around 11:30 PM. She wakes up several times per night with frequent difficulty resuming sleep afterwards. Her final morning awakening is at 7 AM. She estimates that she gets no more than 4-5 hours of sleep on an average night.    Patient Active Problem List   Diagnosis   ? Plantar fascial fibromatosis   ? Systemic lupus erythematosus   ? Osteoarthrosis involving lower leg   ? Other disorder of menstruation and other abnormal bleeding from female genital tract   ? Unspecified transient cerebral ischemia   ? Other chronic pain   ? Other malaise and fatigue   ? Allergy, unspecified not elsewhere classified   ? Chronic airway obstruction, not elsewhere classified   ? Essential hypertension   ? Gonococcal infection (acute) of lower genitourinary tract   ? Other and unspecified hyperlipidemia   ? Sjogrens syndrome   ? Fibromyalgia

## 2017-04-10 ENCOUNTER — Ambulatory Visit: Attending: Ophthalmology | Primary: Internal Medicine

## 2017-04-10 DIAGNOSIS — Z79899 Other long term (current) drug therapy: Principal | ICD-10-CM

## 2017-04-10 DIAGNOSIS — H40003 Preglaucoma, unspecified, bilateral: Secondary | ICD-10-CM

## 2017-04-10 NOTE — Progress Notes
Plaquenil toxicity screening exam      Z79.899   ?Reviewed the last HVF test with the patient whitin normal limits   mac Optical coherence tomography whitin normal limits   RNFL. whitin normal limits     observe     Return to clinic in 4 months or sooner if any problems arise

## 2017-04-16 DIAGNOSIS — M3500 Sicca syndrome, unspecified: Principal | ICD-10-CM

## 2017-04-16 MED ORDER — PILOCARPINE HCL 5 MG PO TABS
5 mg | Freq: Two times a day (BID) | ORAL | 3 refills | Status: CP
Start: 2017-04-16 — End: 2017-10-13

## 2017-04-16 NOTE — Telephone Encounter
Last OV: 04-03-17  OV: 06-03-17    Results for Nichole Colon, Nichole Colon (MRN [id number]) as of 04/16/2017 15:07   Ref. Range 03/07/2017 10:45   dsDNA Ab Latest Units: IU/mL 24 (H)   SODIUM Latest Ref Range: 135 - 146 mmol/L 140   POTASSIUM, SERUM Latest Ref Range: 3.5 - 5.3 mmol/L 4.1   CHLORIDE, SERUM Latest Ref Range: 98 - 110 mmol/L 101   CARBON DIOXIDE TOTAL Latest Ref Range: 20 - 31 mmol/L 25   Urea Nitrogen Latest Ref Range: 7 - 25 mg/dL 11   CREATININE Latest Ref Range: 0.50 - 1.05 mg/dL 0.84   GLUCOSE, SERUM Latest Ref Range: 65 - 99 mg/dL 78   CALCIUM, SERUM Latest Ref Range: 8.6 - 10.4 mg/dL 9.7   BUN/Creatinine Ratio Latest Ref Range: 6 - 22 (calc) NOT APPLICABLE   PROTEIN, TOTAL Latest Ref Range: 6.1 - 8.1 g/dL 7.4   ALBUMIN Latest Ref Range: 3.6 - 5.1 g/dL 4.1   ALBUMIN/GLOBULIN RATIO Latest Ref Range: 1.0 - 2.5 (calc) 1.2   AST Latest Ref Range: 10 - 35 U/L 17   ALT Latest Ref Range: 6 - 29 U/L 11   EGFR Latest Ref Range: > OR = 60 mL/min/1.10m 81   Glom Filt Rate, Est African American Latest Ref Range: > OR = 60 mL/min/1.7110m94   Total Bilirubin Latest Ref Range: 0.2 - 1.2 mg/dL 0.3   Alkaline Phosphatase Latest Ref Range: 33 - 130 U/L 64   C3 Complement Latest Ref Range: 83 - 193 mg/dL 121   C4 Complement Latest Ref Range: 15 - 57 mg/dL 19   GLOBULIN Latest Ref Range: 1.9 - 3.7 g/dL (calc) 3.3   25-HYDROXY, VITAMIN D Latest Ref Range: 30 - 100 ng/mL 25 (L)   WBC Latest Ref Range: 3.8 - 10.8 Thousand/uL 4.7   RBC Latest Ref Range: 3.80 - 5.10 Million/uL 4.54   HEMOGLOBIN Latest Ref Range: 11.7 - 15.5 g/dL 12.8   HEMATOCRIT Latest Ref Range: 35.0 - 45.0 % 39.2   MCV Latest Ref Range: 80.0 - 100.0 fL 86.3   MCH Latest Ref Range: 27.0 - 33.0 pg 28.2   MCHC Latest Ref Range: 32.0 - 36.0 g/dL 32.7   RDW Latest Ref Range: 11.0 - 15.0 % 13.0   PLATELET COUNT Latest Ref Range: 140 - 400 Thousand/uL 236   MPV Latest Ref Range: 7.5 - 12.5 fL 9.6   Neutrophils Latest Units: % 46.6   LYMPHOCYTES Latest Units: % 41.1

## 2017-04-16 NOTE — Telephone Encounter
Pt of Dr. Anabel Bene     Pt called stating she would like a 90 day supply of this refill.     Ph. [phone]    Thank you

## 2017-04-16 NOTE — Telephone Encounter
MONOCYTES Latest Units: % 8.2   EOSINOPHILS Latest Units: % 3.7   BASOPHILS Latest Units: % 0.4   Neutrophils Absolute Latest Ref Range: 1500 - 7800 cells/uL 2190   Lymphocytes Absolute Latest Ref Range: 850 - 3900 cells/uL 1932   Monocytes Absolute Latest Ref Range: 200 - 950 cells/uL 385   Eosinophils Absolute Latest Ref Range: 15 - 500 cells/uL 174   Basophils Absolute Latest Ref Range: 0 - 200 cells/uL 19   SED RATE BY MODIFIED WESTERGREN Latest Ref Range: < OR = 20 mm/h 14   CBC AND DIFFERENTIAL Unknown Rpt   Color -Ur Latest Ref Range: YELLOW  YELLOW   pH -Ur Latest Ref Range: 5.0 - 8.0  6.5   Specific Gravity -Ur Latest Ref Range: 1.001 - 1.035  1.017   Bilirubin -Ur Latest Ref Range: NEGATIVE  NEGATIVE   Glucose -Ur Latest Ref Range: NEGATIVE  NEGATIVE   Hb -Ur Latest Ref Range: NEGATIVE  NEGATIVE   Leukocyte Esterase -Ur Latest Ref Range: NEGATIVE  NEGATIVE   Protein-UA Latest Ref Range: NEGATIVE  NEGATIVE   WBC -Ur Latest Ref Range: < OR = 5 /HPF NONE SEEN   Ketones UA Latest Ref Range: NEGATIVE  NEGATIVE   RBC -Ur Latest Ref Range: < OR = 2 /HPF NONE SEEN   Squam Epithel, UA Latest Ref Range: < OR = 5 /HPF NONE SEEN   Hyaline Casts, UA Latest Ref Range: NONE SEEN /LPF NONE SEEN   APPEARANCE Latest Ref Range: CLEAR  CLEAR   25-HYDROXY VITAMIN D Unknown Rpt (A)   ANTI DNA, DOUBLE STRANDED Unknown Rpt (A)   BACTERIA Latest Ref Range: NONE SEEN /HPF NONE SEEN   C3 COMPLEMENT Unknown Rpt   C4 COMPLEMENT Unknown Rpt   COMPREHENSIVE METABOLIC PANEL Unknown Rpt   NITRITE Latest Ref Range: NEGATIVE  NEGATIVE

## 2017-05-23 ENCOUNTER — Encounter: Attending: Neurology | Primary: Internal Medicine

## 2017-05-26 ENCOUNTER — Ambulatory Visit: Admit: 2017-05-26 | Discharge: 2017-05-27 | Attending: Neurology | Primary: Internal Medicine

## 2017-05-26 DIAGNOSIS — G43719 Chronic migraine without aura, intractable, without status migrainosus: Principal | ICD-10-CM

## 2017-05-26 DIAGNOSIS — Z79899 Other long term (current) drug therapy: Secondary | ICD-10-CM

## 2017-05-26 MED ORDER — ONABOTULINUMTOXINA 100 UNITS IJ SOLR
155 [IU] | Freq: Once | INTRAMUSCULAR | Status: CP
Start: 2017-05-26 — End: ?

## 2017-05-26 NOTE — Progress Notes
Diagnosis:    ICD-10-CM ICD-9-CM   1. Intractable chronic migraine without aura and without status migrainosus G43.719 346.71   Dx duration   Frequency of injections Every 3 months  DOS: May 26, 2017    Benefit She reports no headaches for the 1st two months , with wearing off she has return of her headaches     AEs None       Exam:   BP 143/78  - Pulse 70  - Resp 18  - Ht 1.702 m (5\' 7" )  - Wt 91.6 kg (202 lb)  - BMI 31.64 kg/m?       Procedure note    # of units injected: 155    Unavoidable wastage: 45    Total units billed: 200    Dilution:2:1      See injection scheme in paper chart.    PROCEDURE NOTE:    Muscles injected:  Bilateral frontalis 5 units/site in a linear pattern, 4 sites = 20 units  Bilateral corrugators 5 units/site, 2 sites = 10 units  Procerus 5 units/site = 5 units  Bilateral temporalis 5+ units/site, 6 sites = 30 units  Bilateral occipitalis 5 units/site, 6 units = 30 units  Bilateral splenius capitis 5 units/site, 4 sites = 20 units  Bilateral trapezius 5 units/site, 6 sites = 30 units        EMG guidance no    Patient tolerated procedure without complications.    Botulinum toxin injections are medically necessary and indicated in this patient who is disabled by     ICD-10-CM ICD-9-CM   1. Intractable chronic migraine without aura and without status migrainosus G43.719 346.71       The patient continues to receive benefit from these injections.    Botulinum toxin injections are considered first line treatment in the management of        ICD-10-CM ICD-9-CM    1. Intractable chronic migraine without aura and without status migrainosus G43.719 346.71        Injections will need to be repeated every 3-4 months in order to provide continued benefit for the treatment of this patient?s condition.      Callie Fielding M.D  Movement disorders

## 2017-06-03 ENCOUNTER — Inpatient Hospital Stay: Admit: 2017-06-03 | Discharge: 2017-06-04 | Primary: Internal Medicine

## 2017-06-03 ENCOUNTER — Ambulatory Visit: Attending: Internal Medicine | Primary: Internal Medicine

## 2017-06-03 DIAGNOSIS — M1710 Unilateral primary osteoarthritis, unspecified knee: Secondary | ICD-10-CM

## 2017-06-03 DIAGNOSIS — M76892 Other specified enthesopathies of left lower limb, excluding foot: Secondary | ICD-10-CM

## 2017-06-03 DIAGNOSIS — J449 Chronic obstructive pulmonary disease, unspecified: Secondary | ICD-10-CM

## 2017-06-03 DIAGNOSIS — I2699 Other pulmonary embolism without acute cor pulmonale: Secondary | ICD-10-CM

## 2017-06-03 DIAGNOSIS — M25561 Pain in right knee: Secondary | ICD-10-CM

## 2017-06-03 DIAGNOSIS — M170 Bilateral primary osteoarthritis of knee: Secondary | ICD-10-CM

## 2017-06-03 DIAGNOSIS — Z9119 Patient's noncompliance with other medical treatment and regimen: Secondary | ICD-10-CM

## 2017-06-03 DIAGNOSIS — I2510 Atherosclerotic heart disease of native coronary artery without angina pectoris: Secondary | ICD-10-CM

## 2017-06-03 DIAGNOSIS — M25562 Pain in left knee: Secondary | ICD-10-CM

## 2017-06-03 DIAGNOSIS — IMO0002 Atrial fib/flutter, transient: Secondary | ICD-10-CM

## 2017-06-03 DIAGNOSIS — M797 Fibromyalgia: Secondary | ICD-10-CM

## 2017-06-03 DIAGNOSIS — I8290 Acute embolism and thrombosis of unspecified vein: Secondary | ICD-10-CM

## 2017-06-03 DIAGNOSIS — M3219 Other organ or system involvement in systemic lupus erythematosus: Principal | ICD-10-CM

## 2017-06-03 DIAGNOSIS — I6350 Cerebral infarction due to unspecified occlusion or stenosis of unspecified cerebral artery: Secondary | ICD-10-CM

## 2017-06-03 DIAGNOSIS — I10 Essential (primary) hypertension: Principal | ICD-10-CM

## 2017-06-03 DIAGNOSIS — G8929 Other chronic pain: Secondary | ICD-10-CM

## 2017-06-03 DIAGNOSIS — M76891 Other specified enthesopathies of right lower limb, excluding foot: Secondary | ICD-10-CM

## 2017-06-03 DIAGNOSIS — E785 Hyperlipidemia, unspecified: Secondary | ICD-10-CM

## 2017-06-03 DIAGNOSIS — M7060 Trochanteric bursitis, unspecified hip: Secondary | ICD-10-CM

## 2017-06-03 DIAGNOSIS — G459 Transient cerebral ischemic attack, unspecified: Secondary | ICD-10-CM

## 2017-06-03 DIAGNOSIS — M3500 Sicca syndrome, unspecified: Secondary | ICD-10-CM

## 2017-06-03 DIAGNOSIS — R251 Tremor, unspecified: Secondary | ICD-10-CM

## 2017-06-03 DIAGNOSIS — M329 Systemic lupus erythematosus, unspecified: Secondary | ICD-10-CM

## 2017-06-03 DIAGNOSIS — M1990 Unspecified osteoarthritis, unspecified site: Secondary | ICD-10-CM

## 2017-06-03 MED ORDER — DICLOFENAC SODIUM 1 % TD GEL
4 g | Freq: Four times a day (QID) | TOPICAL | 2 refills | Status: CP
Start: 2017-06-03 — End: 2017-10-13

## 2017-06-03 MED ORDER — ETODOLAC 400 MG PO TABS
400 mg | Freq: Two times a day (BID) | ORAL | 3 refills | Status: CP
Start: 2017-06-03 — End: 2018-02-17

## 2017-06-03 NOTE — Progress Notes
Refer to Physical Therapy    Primary osteoarthritis of both knees        Relevant Medications    etodolac (LODINE) 400 MG PO Tablet    diclofenac (VOLTAREN GEL) 1 % TD Gel    Other Relevant Orders    XR Knee Bilateral Standing AP (Completed)    XR Knee Right 3 Views (Completed)    XR Knee Left 3 Views (Completed)    Trochanteric bursitis, unspecified laterality        Relevant Orders    Refer to Physical Therapy          PLAN:     Counseling was given regarding impression.     Sjogren's /SLE: She has Pos ANA 1:1280 in 2012. Previously treated with PLaquenil  and SSZ at [facility] .    + SSA , negative SSB, mildly low C4 and low level pos anti Ds DNA   Negative RF and ant CCP  Her new ;labs reviewed ; consistent with low disease activity   Still continue to suffer dry mouth   Pt counseled on the need for regular Dental Appointments every 6 months.   Patient was just seen by opthalmology for dry eye and plaquenil eye exam ( 01/2017)   Will continue Plq for now  Trying to avoid steroid because of weight gain    Follow up in 3 months with prior labs     # Bilat knee pain ; she is afraid to walk which caused increased pain . Will get Xray . Start NSAID with stomach protection .  Continue voltern gel   Will get PT for both knee and trochenteric pain .  If not better , consider injection     # Right trochentric bursitis ; injection given last visit , but having radicular pain at bilateral lateral thigh off and on . To continue neurontin .       FMS: Reports poor sleep with trouble falling asleep and staying asleep as well as daytime fatigue.  Pt counseled on need for better sleep hygiene including setting a scheduled bedtime, no day time napping, appropriate bedroom environment as well scheduled exercise program ( a least 30 mins of activity daily)  Currently on gabapentin and Zoloft, flexeril,  tramadol prn     Myint Loni Beckwith ,MD  Assistant  Professor of Medicine,   Division of Rheumatology,

## 2017-06-03 NOTE — Progress Notes
-   HVF 24-2 01/2017: possible bilateral nasal defects with rim artifact OS  - IOP has been wnl, continue to observe for now      Sjogren's syndrome with keratoconjunctivitis sicca  - continue follow up with rheumatology  - recommend artificial tears QID OU  - discussed use of preservative free artificial tears if using more frequently   - discussed minimizing environmental factors    ?  Long-term use of Plaquenil  - currently off plaquenil   - no evidence of maculopathy at this time   - patient currently on 400 mg Plaquenil x 4-5 years  - real body weight: 81.6 kg and daily dose 4.9 mg/kg  - Patient's major risk factors for toxicity:                         - >5.0 mg/kg real weight: no                        - >5 years of use: almost, 5 years per history                        - concurrent renal disease: no                        - concurrent tamoxifen use: no                        - pre-existing macular disease: no  - Screening                        - Baseline HVF (10-2 white SITA): needs to be performed                        - Baseline SD OCT 12/2016: wnl OU                         - recommend annual screening with repeat testing after 5 years of exposure  - discussed risk of toxicity and possible signs/symptoms with patient  - continue per primary/rheumatology    ASSESSMENT:     Problem List Items Addressed This Visit     Systemic lupus erythematosus    Osteoarthrosis involving lower leg    Relevant Medications    etodolac (LODINE) 400 MG PO Tablet    diclofenac (VOLTAREN GEL) 1 % TD Gel    Other Relevant Orders    Refer to Physical Therapy    Fibromyalgia    Relevant Orders    Refer to Physical Therapy      Other Visit Diagnoses     Sicca syndrome        Bilateral chronic knee pain        Relevant Medications    etodolac (LODINE) 400 MG PO Tablet    Other Relevant Orders    XR Knee Bilateral Standing AP (Completed)    XR Knee Right 3 Views (Completed)    XR Knee Left 3 Views (Completed)

## 2017-06-03 NOTE — Progress Notes
bruising  paraesthesia, weakness or Raynaud's  + nausea and heart burn     + sicca especially dry mouth   + pain and fatigue     ROS FOR FMS: Patient is reviewed for fibromyalgia symptoms.  Sleep difficulty +   Exercise :  None.      Associated symptoms:   Migraine headaches.    Irritable bowel symptoms.  ,Depression:      Review Of Systems: All other ROS were reviewed and were unremarkable  Outpatient Medications:  Current Outpatient Medications    Medication Sig Start Date End Date Taking? Authorizing Provider   butalbital-acetaminophen-caffeine (FIORICET, ESGIC) 50-325-40 MG PO Tablet Take 1-2 tabs at headache onset. May repeat 1 tab in 6 hours if needed. Do not treat more then 2 headaches per week. 01/21/17  Yes [name], ARNP   cyclobenzaprine (FLEXERIL) 10 MG PO Tablet Take 1 tablet by mouth every 8 hours as needed for muscle spasms. 01/29/17  Yes Thway, Myint Myat, MD   cycloSPORINE (RESTASIS) 0.05 % OP Emulsion Place 1 drop into both eyes every 12 hours. 01/13/17  Yes Liam Rogers, MD   diclofenac (VOLTAREN GEL) 1 % TD Gel Apply 4 g topically 4 times daily. 06/03/17  Yes Thway, Myint Vic Blackbird, MD   diclofenac (VOLTAREN GEL) 1 % TD Gel Apply 4 g topically 4 times daily. 04/03/17 06/03/17 Yes Thway, Myint Myat, MD   dilTIAZem (DILT-XR) 180 MG PO Capsule Extended Release 24 Hour TAKE 1 CAPSULE BY MOUTH DAILY 01/15/17  Yes Gretchen Short, MD   ergocalciferol (vitamin D-2) 50000 units PO Capsule TAKE 1 CAPSULE BY MOUTH 1 TIME A WEEK ON AN EMPTY STOMACH 01/15/17  Yes Gretchen Short, MD   gabapentin (NEURONTIN) 600 MG PO Tablet TAKE 1 TABLET BY MOUTH THREE TIMES DAILY 04/03/17  Yes Gretchen Short, MD   hydroCHLOROthiazide (HYDRODIURIL) 25 MG PO Tablet TAKE 1 TABLET BY MOUTH DAILY 01/15/17  Yes Gretchen Short, MD   hydroxychloroquine (PLAQUENIL) 200 MG PO Tablet Take 1 tablet by mouth 2 times daily. 01/29/17  Yes Thway, Myint Myat, MD   pilocarpine (SALAGEN) 5 MG PO Tablet Take 1 tablet by mouth 2 times daily.

## 2017-06-03 NOTE — Progress Notes
HEPATITIS C ANTIBODY Latest Ref Range: NON-REACTIVE  NON-REACTIVE       [facility] records -Last visit march 30  SLE Dx 2010 , anti SSA, SSB + , borderline anti ds dna, diffuse arthralgia since 2007  FMS  Complex migraine  But in 2010 labs RF 25, SSA 118, SSB 26, Ds DNA 200 -  Ineffecive response to Plaquenil    Rx SSZ 1000mg  bid (started 3/15)  Lack of response to tramadol  Gabapentin 600mg  tid  Pred short taper   MRI lumbar spine -NO sig   One episode of WBC 3.7       Radiology:    05/27/15  [facility]   Electrophysiologic findings:  1. Normal sural sensory conduction studies bilaterally.  2. Absent superficial peroneal sensory responses bilaterally. The symmetric absence responses a normal finding.  3. Reduced amplitude left tibial motor response with a normal distal latency. The right tibial motor conduction was normal.  4. Normal common peroneal motor conductions bilaterally.  5. Absent common peroneal F-wave responses bilaterally. The symmetric absence of responses is sometimes a normal finding and female patients.  6. Prolonged right tibial F-wave latency. The left tibial F-wave latency was normal.  7. Prolonged tibial H reflex responses bilaterally.  ?  Needle EMG: EMG in the left leg was normal in the tibialis anterior and gastrocnemius muscles.  ?  Impression: Electrophysiologic study was abnormal with findings that seem to be most consistent with mild lumbosacral radicular dysfunction as evidenced by the prolonged late responses, low amplitude distal motor response but normal sensory conductions. The absence of any EMG abnormality indicates that there is not significant motor axon loss associated. We did not find any evidence here to suggest that there is a large fiber peripheral neuropathy. Her symptoms do not seem to be consistent with a small fiber neuropathy    Eye exam (01/2017)     Glaucoma suspect, bilateral  - (+) family history: mother, mGm, mAunt   - African American

## 2017-06-03 NOTE — Progress Notes
?   Hypertension Mother    ? Stroke Mother    ? Vision Loss Mother    ? Asthma Brother    ? Birth Defects Brother    ? Early Death Brother    ? Heart Disease Brother    ? High Cholesterol Brother    ? Hypertension Brother    ? Asthma Daughter    ? High Cholesterol Daughter    ? Hypertension Daughter    ? Asthma Maternal Aunt    ? Heart Disease Maternal Aunt    ? Hypertension Maternal Aunt    ? Miscarriages / Stillbirths Maternal Aunt    ? Stroke Maternal Aunt    ? Vision Loss Maternal Aunt    ? Heart Disease Maternal Uncle    ? Hypertension Maternal Uncle    ? Stroke Maternal Uncle    ? Arthritis Maternal Grandmother    ? Asthma Maternal Grandmother    ? Clotting Disorder Maternal Grandmother    ? Heart Disease Maternal Grandmother    ? Hypertension Maternal Grandmother    ? Miscarriages / Stillbirths Maternal Grandmother    ? Vision Loss Maternal Grandmother        Social History:     Social History     Social History   ? Marital status: Single     Spouse name: N/A   ? Number of children: N/A   ? Years of education: N/A     Social History Main Topics   ? Smoking status: Never Smoker   ? Smokeless tobacco: Never Used   ? Alcohol use No   ? Drug use: No   ? Sexual activity: Not Currently     Other Topics Concern   ? None     Social History Narrative   ? None       Physical Exam:   BP 114/77  - Pulse 57  - Temp 36.9 ?C (98.4 ?F)  - Resp 14  - Ht 1.702 m (5\' 7" )  - Wt 89.3 kg (196 lb 14.4 oz)  - BMI 30.84 kg/m?       General appearance  alert, cooperative, no distress, appears stated age, obese    Psychiatric/Behavioral:  Negative for behavioral problems, confusion and agitation.          Head  Normocephalic, without obvious abnormality, no scalp lesion  normal hair distribution   Eyes  conjunctivae/corneas clear. PERTL, EOMI    HEENT Lips, mucosa very  dry  .no oral ulceration   Neck supple, symmetrical, trachea midline, no adenopathy,thyroid: not enlarged,  no JVD   Back   Normal posture, alignment

## 2017-06-03 NOTE — Progress Notes
C4 Complement 24 15 - 57 mg/dL   Anti DNA, Double Stranded (All Labs)   Result Value Ref Range    dsDNA Ab 18 (H) IU/mL   Sedimentation Rate ESR (All Labs)   Result Value Ref Range    SED RATE BY MODIFIED WESTERGREN 11 < OR = 30 mm/h   Urinalysis W Microscopy ([facility])   Result Value Ref Range    Color -Ur YELLOW YELLOW    APPEARANCE CLEAR CLEAR    Specific Gravity -Ur 1.017 1.001 - 1.035    pH -Ur 7.0 5.0 - 8.0    Glucose -Ur NEGATIVE NEGATIVE    Bilirubin -Ur NEGATIVE NEGATIVE    Ketones UA NEGATIVE NEGATIVE    Hb -Ur NEGATIVE NEGATIVE    Protein-UA NEGATIVE NEGATIVE    NITRITE NEGATIVE NEGATIVE    Leukocyte Esterase -Ur NEGATIVE NEGATIVE    WBC -Ur NONE SEEN < OR = 5 /HPF    RBC -Ur NONE SEEN < OR = 2 /HPF    Squam Epithel, UA NONE SEEN < OR = 5 /HPF    BACTERIA NONE SEEN NONE SEEN /HPF    Hyaline Casts, UA NONE SEEN NONE SEEN /LPF   CBC and Differential (All Labs)   Result Value Ref Range    WHITE BLOOD CELL COUNT 3.6 (L) 3.8 - 10.8 Thousand/uL    RBC 4.48 3.80 - 5.10 Million/uL    Hemoglobin 12.4 11.7 - 15.5 g/dL    Hematocrit 38.0 35.0 - 45.0 %    MCV 84.8 80.0 - 100.0 fL    MCH 27.7 27.0 - 33.0 pg    MCHC 32.6 32.0 - 36.0 g/dL    RDW 12.8 11.0 - 15.0 %    Platelets 226 140 - 400 Thousand/uL    MPV 9.9 7.5 - 12.5 fL    Neutrophils Absolute 1,357 (L) 1,500 - 7,800 cells/uL    Lymphocytes Absolute 1,894 850 - 3,900 cells/uL    Monocytes Absolute 238 200 - 950 cells/uL    Eosinophils Absolute 101 15 - 500 cells/uL    Basophils Absolute 11 0 - 200 cells/uL    Neutrophils 37.7 %    LYMPHOCYTES 52.6 %    MONOCYTES 6.6 %    EOSINOPHILS 2.8 %    BASOPHILS 0.3 %       Results for Nichole Colon, Nichole Colon (MRN [id number]) as of 06/03/2017 08:44   Ref. Range 12/25/2016 09:01   Hep A IgM Latest Ref Range: NON-REACTIVE  NON-REACTIVE   HEPATITIS B CORE ANTIBODY (IGM) Latest Ref Range: NON-REACTIVE  NON-REACTIVE   HEPATITIS B SURFACE ANTIGEN Latest Ref Range: NON-REACTIVE  NON-REACTIVE

## 2017-06-03 NOTE — Progress Notes
04/16/17  Yes Thway, Myint Myat, MD   sertraline (ZOLOFT) 50 MG PO Tablet TAKE 1 TABLET BY MOUTH EVERY DAY 01/15/17  Yes Gretchen Short, MD   solifenacin (VESICARE) 5 MG PO Tablet Take 1 tablet by mouth daily. 01/15/17  Yes Gretchen Short, MD   topiramate (TOPAMAX) 25 MG PO Capsule Sprinkle Take 1 caps nightly x 1 week then 1 caps twice daily x 1 week then 1 caps AM and 2 caps PM x 1 week then 2 caps twice daily 04/01/17  Yes [name], ARNP   traMADol (ULTRAM) 50 MG PO Tablet Take 1 tablet by mouth every 8 hours as needed for pain. 04/03/17  Yes Thway, Myint Vic Blackbird, MD   etodolac (LODINE) 400 MG PO Tablet Take 1 tablet by mouth 2 times daily. prn 06/03/17   Thway, Arlyss Gandy, MD   prednisoLONE acetate (PRED FORTE) 1 % OP Suspension Place 1 drop into both eyes 4 times daily for 14 days. 01/14/17 01/28/17  Verdene Lennert, MD   traZODone (DESYREL) 50 MG PO Tablet Take 1 tablet by mouth nightly at bedtime for 30 doses. 04/09/17 05/09/17  Orlean Patten, MD       Allergies:  Allergies   Allergen Reactions   ? No Known Drug Allergy      No Known Drug Allergies       PMH:   Past Medical History:   Diagnosis Date   ? Atrial fib/flutter, transient    ? Blood clot in vein    ? CAD (coronary artery disease)    ? Cerebral artery occlusion with cerebral infarction    ? COPD (chronic obstructive pulmonary disease)    ? Hyperlipemia    ? Hypertension    ? Lupus    ? Occasional tremors    ? Osteoarthritis    ? Personal history of noncompliance with medical treatment, presenting hazards to health    ? Pulmonary embolism    ? Sjogren's syndrome    ? TIA (transient ischemic attack)        PSH:  Past Surgical History:   Procedure Laterality Date   ? CHOLECYSTECTOMY      C-Section   ? CHOLECYSTECTOMY      Surgery Description: Cholecystectomy;   (Created by Conversion)       Family History:   No Rheumatological family history    Family History   Problem Relation Age of Onset   ? Asthma Mother    ? Heart Disease Mother

## 2017-06-03 NOTE — Progress Notes
[place], [place].

## 2017-06-03 NOTE — Progress Notes
Department of Rheumatology    Patient: Nichole Colon  MRN: [id number]  DOB: [birth year]      Chief Complaint:  Follow-up (2 Months)    Patient Active Problem List   Diagnosis   ? Plantar fascial fibromatosis   ? Systemic lupus erythematosus   ? Osteoarthrosis involving lower leg   ? Other disorder of menstruation and other abnormal bleeding from female genital tract   ? Unspecified transient cerebral ischemia   ? Other chronic pain   ? Other malaise and fatigue   ? Allergy, unspecified not elsewhere classified   ? Chronic airway obstruction, not elsewhere classified   ? Essential hypertension   ? Gonococcal infection (acute) of lower genitourinary tract   ? Other and unspecified hyperlipidemia   ? Sjogrens syndrome   ? Fibromyalgia   ? Migraine without aura and without status migrainosus, not intractable   ? Chronic bilateral low back pain without sciatica   ? OAB (overactive bladder)   ? Body mass index 28.0-28.9, adult   ? Chest pain   ? Esophageal dysphagia       History of Present Illness:     Nichole Colon is an 51 y.o. AA female who is here for follow up  SLE/ Sjogren's  Disease and fibromyalgia .    She saw  Dr Barrie Folk for several years and established Dx of lupus/Sjogren's, Fibromyalgia and OA around 2010  .   Her last visit  With him was 03/14/14 until insurance changed   She was treated with Plaquenil then switched to SSZ . She was on SSZ 2 months and stopped because of nausea . Plaqunil  also gave her dry skin and it was stopped .  She was not on any med in her first visit here 03/06/15  except for FMS.  Neurontin, baclofen and mobic prn   Per [facility] note that time ,  SLE was Dx  (2010) anti SSA, SSB  + , borderline Ds DNA , negative APL study   She was restarted PLq and salagen (03/30/2015)  .    12/24/16   She is here for follow up. Last seen March 2017. Reports that she was hospitalized 2 times since last being seen for her IBS-. Denies any other

## 2017-06-03 NOTE — Progress Notes
complaints during that time or any surgical procedure. Per pt she still has not see the Ophthalmologist and ran out of her plaquenil about 1 week ago. She also has complaints of continued Sicca symptoms as well as sharp shooting pains in her upper back and shoulders for the past day.    01/29/17   C/o fatigue and pain all over . We stopped Plaquenil last visit because no eye exam . Now she was seen  Recently and no maculopathy .  Her treatment for fibromyalgia is adjused in last visit .     04/03/17  RTC for SLE ,   Pred 5mg  daily for 1 month done and had labs after  Which seems better . No leucopenia, complements normal . Lower DsDNA . C/o pain and weight gain . Pain especially in knees and right lateral hip.  Other ROS negative except fatigue weight gain and joint pain .    06/03/17  RTC 2 months . SLE/Sjogren's currently on Plaquenil bid .  She c/o bilat lateral thigh and knee pain . Had right throchenteric injection last visit . Pain was better but still having sharp pain off and on . Bilat knees making sound as well as so painful .  Labs reviewed Ds DNA now 18 . Normal complement . Total WBC is slightly low but normal Lymphocyte .     The pain is At   Knees ,  Can't sleep sometimes  no swelling Morning stiffness and tender + . Right hip , ( lateral thigh ) can't walk well , can't sleep on that side   Patient describes the level of pain as  7 of 10.   The pain was first noticed    6 Years     It usually lasts  * always there.   When is the problem worst?  No specific time   Does anything make the problem worse?  moving around, walking ,standing , driving ,lying on my side   Does anything help or make the problem better?no     CONSTITUTIONAL SYMPTOMS:No Fever, Weight Loss or Night Sweats , +++fatigue    Review of systems for connective tissue disease revealed:   No Photosensitivity,malar rash ,discoid rash , hair loss, mouth ulcer , dysphagia, dyspnea, chest pain,SOB,  abdominal pain, blood in stool ,easy

## 2017-06-03 NOTE — Progress Notes
+    tenderness to palpation of spinous processes.? muscle spasm in neck and trapezius   ?   Lungs   Not tachypnea . Normal respiratory effort  clear to auscultation bilaterally       Heart  regular rate and rhythm, S1, S2 normal, no murmur, click, rub or gallop   Abdomen   soft, non-tender. Bowel sounds normal. No masses,  No organomegaly   MSK No objective  synovitis , dactylitis in complete exam.  FMS tender point 16/18*  Bilat knees crepitus with movement   Right knee - seems small effusion + , full ROM    Extremities extremities normal, atraumatic, no cyanosis or edema   Pulses 2+ and symmetric   Skin Skin color, texture, turgor normal. No rashes or lesions   Lymph nodes Cervical, supraclavicular, and axillary nodes normal.   Neurologic MENTAL STATUS: Alert. Oriented to person, place, and time. Intact recent and remote memory. No aphasia.   CRANIAL NERVES: Cranial nerves II-XII were normal   No motor /sensory deficit        Lab or Diagnostic Testing:     Results for orders placed or performed in visit on 04/03/17   Comprehensive Metabolic Panel (All Labs)   Result Value Ref Range    Glucose 88 65 - 99 mg/dL    Urea Nitrogen 11 7 - 25 mg/dL    Creatinine 0.70 0.50 - 1.05 mg/dL    EGFR 100 > OR = 60 mL/min/1.36m    Glom Filt Rate, Est African American 116 > OR = 60 mL/min/1.773m   BUN/Creatinine Ratio NOT APPLICABLE 6 - 22 (calc)    Sodium 142 135 - 146 mmol/L    Potassium 3.9 3.5 - 5.3 mmol/L    Chloride 110 98 - 110 mmol/L    CARBON DIOXIDE 24 20 - 31 mmol/L    Calcium 9.1 8.6 - 10.4 mg/dL    Protein, Total 6.9 6.1 - 8.1 g/dL    ALBUMIN 3.7 3.6 - 5.1 g/dL    Globulin 3.2 1.9 - 3.7 g/dL (calc)    ALBUMIN/GLOBULIN RATIO 1.2 1.0 - 2.5 (calc)    Total Bilirubin 0.2 0.2 - 1.2 mg/dL    Alkaline Phosphatase 52 33 - 130 U/L    AST 17 10 - 35 U/L    ALT 9 6 - 29 U/L   C3 Complement (All Labs)   Result Value Ref Range    C3 Complement 103 83 - 193 mg/dL   C4 Complement (All Labs)   Result Value Ref Range

## 2017-06-06 ENCOUNTER — Inpatient Hospital Stay: Admit: 2017-06-06 | Discharge: 2017-06-14 | Primary: Internal Medicine

## 2017-06-06 DIAGNOSIS — G8929 Other chronic pain: Secondary | ICD-10-CM

## 2017-06-06 DIAGNOSIS — M25562 Pain in left knee: Secondary | ICD-10-CM

## 2017-06-06 DIAGNOSIS — M25561 Pain in right knee: Secondary | ICD-10-CM

## 2017-06-06 DIAGNOSIS — M7060 Trochanteric bursitis, unspecified hip: Secondary | ICD-10-CM

## 2017-06-06 DIAGNOSIS — M1710 Unilateral primary osteoarthritis, unspecified knee: Secondary | ICD-10-CM

## 2017-06-06 DIAGNOSIS — M797 Fibromyalgia: Principal | ICD-10-CM

## 2017-06-06 NOTE — Initial Plan of Care
elements) moderate (evolving) moderate (using standardized patient assessment instrument and/or measureable assessment of functional outcome)   PT Evaluation Complexity Charge: moderate     Procedures Charged:    All Charges for This Encounter     Code Description Service Date Service Provider Modifiers Otho Darner    38177116 [facility] PT EVAL MOD COMPLX 06/06/2017 Cherylann Parr, PT GP 1    57903833 [facility] PT MOBILITY GOAL STATUS 06/06/2017 Cherylann Parr, PT GP, CK 1    38329191 [facility] PT MOBILITY CURRENT STATUS 06/06/2017 Cherylann Parr, PT GP, CL 1            Physical Therapy Plan of Care:     Assessment: pt presents with [facility] to bil knee ROM, but presents with dec in strength and balance to bil LEs. Pt reports tenderness to R med joint line and patellar tendon. Pt was given a HEP and will benefit from skilled PT to inc overall strength and balance for safety during ADLs.     Prognosis: Fair    PLAN:  PT Frequency: 2x/wk     Treatment Duration: 8 weeks      GOALS:    Long Term Goals: 8 Weeks  1. Patient will demonstrate improved function by 7 points in FOTO to help with ADLs.       _________________________   Cherylann Parr, PT   06/06/2017       This plan of care is certified to be medically necessary for the time period from 06/06/2017 to 08/06/17.

## 2017-06-06 NOTE — Initial Plan of Care
Physical Therapy LE Orthopedic Evaluation and Plan of Care      Name: Nichole Colon  /  DOB: [birth year]  /  MRN: [id number]  Date of Service: 06/06/2017  Referring Provider: Norval Gable, MD  Primary Care: Gretchen Short, MD  Insurance: [facility] /   Comments: FOTO completed at eval    Diagnosis Information:     ICD-10-CM ICD-9-CM   1. Fibromyalgia M79.7 729.1   2. Osteoarthrosis involving lower leg M17.10 715.96   3. Bilateral chronic knee pain M25.561 719.46    M25.562 338.29    G89.29    4. Trochanteric bursitis, unspecified laterality M70.60 726.5       History of Present Illness: pt reports bil knee pain began years ago insidiously. Pt reports inc pain with stairs, standing, walking, prolonged sitting.     Subjective:     Patient reports that their current physical therapy goals are improve balance/stability.    * Was an interpreter used: NA    Vital Signs and Oxygen Therapy:   ? Blood pressure: 129/8mmHg  ? Pulse: 63 bpm    Pain Assessment:  ? Pre Treatment Pain Rating: 7/10  ? Post Treatment Pain Rating: 7/10  ? Pain Location/Orientation: bil knees  ? Pain Descriptors: ache/sharp      Living Environment Function   Lives in: house:  one story    Stairs to Enter: 3    Home DME: does not use equipment    Bathroom set up: tub    Additional Comments:  Prior Level Of Function: was independent with self-care, transfers, ambulation, household tasks and/or recreational activities    Lives: with spouse    Receives Help From: spouse    Home assistance: Intermittent    Employment: Patient is disabled.    Leisure/Sports Activities: The patient does not participate in leisure/sport activities.     Additional Comments:      Precautions Observations   lupus, FM, sjogrens (dry mouth/eyes)   General comments:     Posture:     Effusion/Edema:          Objective:         Lower Extremity ROM: WFL      Flexibility: dec bil quad, HS, gastroc    Lower Extremity Strength:    HIP Left Right   Flexion 3+/5 3+/5

## 2017-06-06 NOTE — Initial Plan of Care
Extension 3+/5 3+/5   Abduction 3/5 3/5   Adduction 4/5 4/5     KNEE Left Right   Extension 3+/5 3+/5   Flexion 3+/5 3+/5     Lower Extremity Palpation:  ? Hip: Palpation reveals tenderness over the no tenderness elicited.  ? Knee: Palpation reveals tenderness over the right, medial joint line and patellar tendon.  ? Ankle/Foot: Palpation reveals tenderness over the no tenderness elicited.      Functional Mobility/Transfers: inc use of bil UE support for transfers    Gait Observations:  1. * Gait Overall Assessment: dec bil knee flex, dec cadance    Balance:    Static Standing  ? Static Standing Balance Overall Assessment: Fair  Dynamic Standing  ? Dynamic Standing Balance Overall Assessment: Fair    Outcome Measures:  ? FOTO Functional Status Measure: 38 (Full report available in Media Tab)      Treatment:  Treatment today consisted of the therapist and patient/caregiver discussing the treatment plans, goals, benefits, risks/drawbacks, potential problems related to recuperation, likelihood of success, potential results of non-treatment, significant alternative care options and the recommended frequency and duration of treatment.  The patient/caregiver is in agreement with the above and consented to the continuation of therapy.    Treatment today also consisted of: exercises including QS, SLR, bridges with add, HS stretch and patient/caregiver education regarding the no show, cancellation and discharge policy.    Home Exercise Program: The patient was educated in and given a written copy of a home exercise program including the above exercises.    Frequency of HEP daily    The patient/caregiver understood and demonstrated comprehension of all education given during today?s treatment.    Total Treatment Time: 45 minutes  Total Timed Code Minutes: 5 minutes    PT Evaluation Complexity   History Examination Clinical Presentation Decision Making   high (3 or more personal factors/comorbidities) moderate (addressing 3

## 2017-06-10 ENCOUNTER — Encounter: Attending: Acute Care | Primary: Internal Medicine

## 2017-06-12 NOTE — Progress Notes
Date of Service:  06/12/2017    Name: Nichole Colon  /  DOB: 1966-03-30  /  MRN: [id number]    Visit Information:  ? Visit Number: 1   ? Left Without Being Seen Number: 0  ? Canceled Number: 1  ? No Show Number: 0  ? Comment: pt called to cancel today's appt due to having a migraine.    * Was an interpreter used: NA    ________________________  Jule Economy, PTA

## 2017-06-13 ENCOUNTER — Inpatient Hospital Stay: Admit: 2017-06-13 | Discharge: 2017-06-13 | Primary: Internal Medicine

## 2017-06-20 DIAGNOSIS — M7061 Trochanteric bursitis, right hip: Secondary | ICD-10-CM

## 2017-06-20 DIAGNOSIS — M170 Bilateral primary osteoarthritis of knee: Secondary | ICD-10-CM

## 2017-06-20 DIAGNOSIS — M1710 Unilateral primary osteoarthritis, unspecified knee: Secondary | ICD-10-CM

## 2017-06-20 DIAGNOSIS — M3219 Other organ or system involvement in systemic lupus erythematosus: Principal | ICD-10-CM

## 2017-06-20 DIAGNOSIS — E559 Vitamin D deficiency, unspecified: Secondary | ICD-10-CM

## 2017-06-20 DIAGNOSIS — M797 Fibromyalgia: Secondary | ICD-10-CM

## 2017-06-23 DIAGNOSIS — G43009 Migraine without aura, not intractable, without status migrainosus: Principal | ICD-10-CM

## 2017-06-26 ENCOUNTER — Encounter: Attending: Internal Medicine | Primary: Internal Medicine

## 2017-06-26 MED ORDER — TOPIRAMATE 25 MG PO CPSP
3 refills | Status: CP
Start: 2017-06-26 — End: 2017-11-13

## 2017-07-11 ENCOUNTER — Ambulatory Visit: Attending: Internal Medicine | Primary: Internal Medicine

## 2017-07-11 DIAGNOSIS — N3281 Overactive bladder: Secondary | ICD-10-CM

## 2017-07-11 DIAGNOSIS — I2699 Other pulmonary embolism without acute cor pulmonale: Secondary | ICD-10-CM

## 2017-07-11 DIAGNOSIS — I2510 Atherosclerotic heart disease of native coronary artery without angina pectoris: Secondary | ICD-10-CM

## 2017-07-11 DIAGNOSIS — M1990 Unspecified osteoarthritis, unspecified site: Secondary | ICD-10-CM

## 2017-07-11 DIAGNOSIS — M329 Systemic lupus erythematosus, unspecified: Secondary | ICD-10-CM

## 2017-07-11 DIAGNOSIS — I8290 Acute embolism and thrombosis of unspecified vein: Secondary | ICD-10-CM

## 2017-07-11 DIAGNOSIS — G459 Transient cerebral ischemic attack, unspecified: Secondary | ICD-10-CM

## 2017-07-11 DIAGNOSIS — Z6831 Body mass index (BMI) 31.0-31.9, adult: Principal | ICD-10-CM

## 2017-07-11 DIAGNOSIS — Z9119 Patient's noncompliance with other medical treatment and regimen: Secondary | ICD-10-CM

## 2017-07-11 DIAGNOSIS — R251 Tremor, unspecified: Secondary | ICD-10-CM

## 2017-07-11 DIAGNOSIS — IMO0002 Atrial fib/flutter, transient: Secondary | ICD-10-CM

## 2017-07-11 DIAGNOSIS — Z1231 Encounter for screening mammogram for malignant neoplasm of breast: Secondary | ICD-10-CM

## 2017-07-11 DIAGNOSIS — E785 Hyperlipidemia, unspecified: Secondary | ICD-10-CM

## 2017-07-11 DIAGNOSIS — I10 Essential (primary) hypertension: Principal | ICD-10-CM

## 2017-07-11 DIAGNOSIS — J449 Chronic obstructive pulmonary disease, unspecified: Secondary | ICD-10-CM

## 2017-07-11 DIAGNOSIS — M3500 Sicca syndrome, unspecified: Secondary | ICD-10-CM

## 2017-07-11 DIAGNOSIS — I6350 Cerebral infarction due to unspecified occlusion or stenosis of unspecified cerebral artery: Secondary | ICD-10-CM

## 2017-07-11 MED ORDER — SOLIFENACIN SUCCINATE 10 MG PO TABS
10 mg | Freq: Every day | ORAL | 1 refills | Status: SS
Start: 2017-07-11 — End: 2018-08-15

## 2017-07-11 MED ORDER — SOLIFENACIN SUCCINATE 5 MG PO TABS
5 mg | Freq: Every day | ORAL | 3 refills | Status: CN
Start: 2017-07-11 — End: ?

## 2017-07-12 NOTE — Progress Notes
Reason:  Physician ordered labs  Amount:  2 Tubes  Type:  Vaccutainer  Site:  Vein  right arm  Reaction:  None    Draw performed by:  Rometta Emery,  07/11/2017

## 2017-07-12 NOTE — Progress Notes
Subjective:   Nichole Colon is a 51 y.o. female being seen today for Medications Refill       HPI  OAB   CURRENTLY ON VESICARE 5 MG; SOME BENIFIT  URGENCY   FREQUENCY Q 2-3 HOURS  ENEURSIS  NO DYSURAI  NO HEMATURIA  Past Medical History:   Diagnosis Date   ? Atrial fib/flutter, transient    ? Blood clot in vein    ? CAD (coronary artery disease)    ? Cerebral artery occlusion with cerebral infarction    ? COPD (chronic obstructive pulmonary disease)    ? Hyperlipemia    ? Hypertension    ? Lupus    ? Occasional tremors    ? Osteoarthritis    ? Personal history of noncompliance with medical treatment, presenting hazards to health    ? Pulmonary embolism    ? Sjogren's syndrome    ? TIA (transient ischemic attack)      Past Surgical History:   Procedure Laterality Date   ? CHOLECYSTECTOMY      C-Section   ? CHOLECYSTECTOMY      Surgery Description: Cholecystectomy;   (Created by Conversion)     Family History   Problem Relation Age of Onset   ? Asthma Mother    ? Heart Disease Mother    ? Hypertension Mother    ? Stroke Mother    ? Vision Loss Mother    ? Asthma Brother    ? Birth Defects Brother    ? Early Death Brother    ? Heart Disease Brother    ? High Cholesterol Brother    ? Hypertension Brother    ? Asthma Daughter    ? High Cholesterol Daughter    ? Hypertension Daughter    ? Asthma Maternal Aunt    ? Heart Disease Maternal Aunt    ? Hypertension Maternal Aunt    ? Miscarriages / Stillbirths Maternal Aunt    ? Stroke Maternal Aunt    ? Vision Loss Maternal Aunt    ? Heart Disease Maternal Uncle    ? Hypertension Maternal Uncle    ? Stroke Maternal Uncle    ? Arthritis Maternal Grandmother    ? Asthma Maternal Grandmother    ? Clotting Disorder Maternal Grandmother    ? Heart Disease Maternal Grandmother    ? Hypertension Maternal Grandmother    ? Miscarriages / Stillbirths Maternal Grandmother    ? Vision Loss Maternal Grandmother      Social History     Social History   ? Marital status: Single

## 2017-07-12 NOTE — Progress Notes
?   prednisoLONE acetate (PRED FORTE) 1 % OP Suspension Place 1 drop into both eyes 4 times daily for 14 days.   ? traZODone (DESYREL) 50 MG PO Tablet Take 1 tablet by mouth nightly at bedtime for 30 doses.     No current facility-administered medications on file prior to visit.      Allergies   Allergen Reactions   ? No Known Drug Allergy      No Known Drug Allergies         Review of Systems  Review of Systems   Respiratory: Negative.    Cardiovascular: Negative.    Gastrointestinal: Negative.    Genitourinary: Positive for difficulty urinating, enuresis, frequency and urgency. Negative for dysuria and pelvic pain.           Objective:        VITAL SIGNS (all recorded)      Nichole Colon Name 07/11/17 1020                Amb Encounter Vitals    Weight 89.8 kg (198 lb)    -CT at 07/11/17 1020       Height 1.702 m (5\' 7" )    -CT at 07/11/17 1020       BMI (Calculated) 31.08    -CT at 07/11/17 1020       BSA (Calculated - sq m) 2.06    -CT at 07/11/17 1020       BP 100/62    -CT at 07/11/17 1020       Pulse 66    -CT at 07/11/17 1020       Resp 18    -CT at 07/11/17 1020       O2 Saturation 99 %    -CT at 07/11/17 1020          Education/Communication Barriers?    Learning/Communication Barriers? No    -CT at 07/11/17 1020          Fall Risk Assessment    Had recent fall / Last 6 months? No recent fall    -CT at 07/11/17 1020       Does patient have a fear of falling? No    -CT at 07/11/17 1020         User Key  (r) = Recorded By, (t) = Taken By, (c) = Cosigned By    Initials Name Effective Dates    CT Floydene Flock, [place] 06/12/16 -         Physical Exam   Constitutional: She is oriented to person, place, and time. She appears well-developed and well-nourished.   Cardiovascular: Normal rate.    Pulmonary/Chest: Effort normal.   Abdominal: Soft. She exhibits no distension and no mass. There is no tenderness. There is no guarding.   Musculoskeletal: Normal range of motion.

## 2017-07-12 NOTE — Progress Notes
Spouse name: N/A   ? Number of children: N/A   ? Years of education: N/A     Occupational History   ? Not on file.     Social History Main Topics   ? Smoking status: Never Smoker   ? Smokeless tobacco: Never Used   ? Alcohol use No   ? Drug use: No   ? Sexual activity: Not Currently     Other Topics Concern   ? Not on file     Social History Narrative   ? No narrative on file     Current Outpatient Prescriptions on File Prior to Visit   Medication Sig   ? butalbital-acetaminophen-caffeine (FIORICET, ESGIC) 50-325-40 MG PO Tablet Take 1-2 tabs at headache onset. May repeat 1 tab in 6 hours if needed. Do not treat more then 2 headaches per week.   ? cyclobenzaprine (FLEXERIL) 10 MG PO Tablet Take 1 tablet by mouth every 8 hours as needed for muscle spasms.   ? cycloSPORINE (RESTASIS) 0.05 % OP Emulsion Place 1 drop into both eyes every 12 hours.   ? diclofenac (VOLTAREN GEL) 1 % TD Gel Apply 4 g topically 4 times daily.   ? dilTIAZem (DILT-XR) 180 MG PO Capsule Extended Release 24 Hour TAKE 1 CAPSULE BY MOUTH DAILY   ? ergocalciferol (vitamin D-2) 50000 units PO Capsule TAKE 1 CAPSULE BY MOUTH 1 TIME A WEEK ON AN EMPTY STOMACH   ? etodolac (LODINE) 400 MG PO Tablet Take 1 tablet by mouth 2 times daily. prn   ? gabapentin (NEURONTIN) 600 MG PO Tablet TAKE 1 TABLET BY MOUTH THREE TIMES DAILY   ? hydroCHLOROthiazide (HYDRODIURIL) 25 MG PO Tablet TAKE 1 TABLET BY MOUTH DAILY   ? hydroxychloroquine (PLAQUENIL) 200 MG PO Tablet Take 1 tablet by mouth 2 times daily.   ? pilocarpine (SALAGEN) 5 MG PO Tablet Take 1 tablet by mouth 2 times daily.   ? sertraline (ZOLOFT) 50 MG PO Tablet TAKE 1 TABLET BY MOUTH EVERY DAY   ? [DISCONTINUED] solifenacin (VESICARE) 5 MG PO Tablet Take 1 tablet by mouth daily.   ? topiramate (TOPAMAX) 25 MG PO Capsule Sprinkle Take 3 caps twice daily   ? traMADol (ULTRAM) 50 MG PO Tablet Take 1 tablet by mouth every 8 hours as needed for pain.

## 2017-07-12 NOTE — Progress Notes
Neurological: She is alert and oriented to person, place, and time.   Skin: Skin is warm and dry.        Assessment:       ICD-10-CM ICD-9-CM    1. BMI 31.0-31.9,adult Z68.31 V85.31    2. OAB (overactive bladder) N32.81 596.51 Refer to Urogynecology      solifenacin (VESICARE) 10 MG PO Tablet      POCT Urinalysis w/out microscopy inhouse-AUTO   3. Essential hypertension I10 401.9 LIPID PANEL      CBC      COMPREHENSIVE METABOLIC PANEL      LIPID PANEL      CBC      COMPREHENSIVE METABOLIC PANEL   4. Screening mammogram, encounter for Z12.31 V76.12 MAM Screening        BP CONTROLLED  VESICARE ONY PARTIALLY EFFECTIVE  OAB IS HAVING SIGNIFICANT IMPACT ON ACTIVITES OF DAILY LIVING  REC UROGYN CONSULT    MAMMO ORDERS  Plan:     Orders Placed This Encounter   Medications   ? solifenacin (VESICARE) 10 MG PO Tablet     Sig: Take 1 tablet by mouth daily.     Dispense:  90 tablet     Refill:  1     Orders Placed This Encounter   Procedures   ? MAM Screening   ? LIPID PANEL   ? CBC   ? COMPREHENSIVE METABOLIC PANEL   ? Refer to Urogynecology   ? POCT Urinalysis w/out microscopy inhouse-AUTO       Health Maintenance was reviewed. The patient's HM Topic list was:                                            Health Maintenance   Topic Date Due   ? Zoster Vaccine (1 of 2) 08/11/2017 (Originally 03/28/2016)   ? Influenza Vaccine (1) 08/16/2017   ? Preventive Wellness Visit  01/15/2018   ? Basic Metabolic Panel  27/02/5008   ? Lipid Profile  07/11/2018   ? Breast Cancer Screening  07/12/2019   ? Pap Smear  01/16/2020   ? Colon Cancer Screening  01/15/2027   ? USPSTF HIV Risk Assessment  Completed   ? DTaP,Tdap,and Td Vaccines  Excluded   ? Pneumovax / Prevnar  Excluded

## 2017-07-15 NOTE — Discharge Summary
Physical Therapy LE Orthopedic Discharge      Name: Nichole Colon  /  DOB: 05-Jan-1966  /  MRN: [id number]  Referring Provider: Norval Gable, MD  Primary Care: Gretchen Short, MD  Insurance: MEDICARE [facility] /   Reporting period- 06/06/17-07/15/17    Diagnosis Information:     ICD-10-CM ICD-9-CM   1. Fibromyalgia M79.7 729.1   2. Osteoarthrosis involving lower leg M17.10 715.96   3. Bilateral chronic knee pain M25.561 719.46    M25.562 338.29    G89.29    4. Trochanteric bursitis, unspecified laterality M70.60 726.5           Objective:         Lower Extremity ROM: WFL      Flexibility: dec bil quad, HS, gastroc    Lower Extremity Strength:    HIP Left Right   Flexion 3+/5 3+/5   Extension 3+/5 3+/5   Abduction 3/5 3/5   Adduction 4/5 4/5     KNEE Left Right   Extension 3+/5 3+/5   Flexion 3+/5 3+/5     Lower Extremity Palpation:  ? Hip: Palpation reveals tenderness over the no tenderness elicited.  ? Knee: Palpation reveals tenderness over the right, medial joint line and patellar tendon.  ? Ankle/Foot: Palpation reveals tenderness over the no tenderness elicited.      Functional Mobility/Transfers: inc use of bil UE support for transfers    Gait Observations:  1. * Gait Overall Assessment: dec bil knee flex, dec cadance    Balance:    Static Standing  ? Static Standing Balance Overall Assessment: Fair  Dynamic Standing  ? Dynamic Standing Balance Overall Assessment: Fair    Outcome Measures:  ? FOTO Functional Status Measure: 38 (Full report available in Media Tab)        Physical Therapy Plan of Care:     Assessment: pt did not return for tx post eval and cx her appt. Objective and outcome measures reflect pt presentation at time of eval. Pt is DC at this time due to non compliance.       PLAN:  DC      GOALS: not met    Long Term Goals: 8 Weeks  1. Patient will demonstrate improved function by 7 points in FOTO to help with ADLs.       _________________________   Cherylann Parr, PT   06/06/2017

## 2017-07-15 NOTE — Discharge Summary
This plan of care is certified to be medically necessary for the time period from 06/06/2017 to 08/06/17.

## 2017-07-16 ENCOUNTER — Encounter: Attending: Family Medicine | Primary: Internal Medicine

## 2017-07-17 DIAGNOSIS — G894 Chronic pain syndrome: Principal | ICD-10-CM

## 2017-07-17 MED ORDER — GABAPENTIN 600 MG PO TABS
0 refills | Status: CP
Start: 2017-07-17 — End: 2017-10-13

## 2017-07-17 NOTE — Telephone Encounter
Pt called for an update on request below pt stated that she is going out of town and prescription is needed    Thank you

## 2017-08-25 DIAGNOSIS — M3500 Sicca syndrome, unspecified: Secondary | ICD-10-CM

## 2017-08-25 DIAGNOSIS — M3219 Other organ or system involvement in systemic lupus erythematosus: Principal | ICD-10-CM

## 2017-08-25 MED ORDER — HYDROXYCHLOROQUINE SULFATE 200 MG PO TABS
200 mg | Freq: Two times a day (BID) | ORAL | 3 refills | Status: CP
Start: 2017-08-25 — End: 2018-07-03

## 2017-08-25 NOTE — Telephone Encounter
From: Nichole Colon  Sent: 08/25/2017 8:43 AM EDT  Subject: Medication Renewal Request    Nichole Colon would like a refill of the following medications:     hydroxychloroquine (PLAQUENIL) 200 MG PO Tablet [Myint Loni Beckwith, MD]    Preferred pharmacy: [id number] - [place], [place] - [address] AT [place]

## 2017-09-01 ENCOUNTER — Encounter: Attending: Neurology | Primary: Internal Medicine

## 2017-09-01 ENCOUNTER — Encounter: Attending: Internal Medicine | Primary: Internal Medicine

## 2017-09-02 ENCOUNTER — Encounter: Attending: Acute Care | Primary: Internal Medicine

## 2017-09-04 DIAGNOSIS — F323 Major depressive disorder, single episode, severe with psychotic features: Principal | ICD-10-CM

## 2017-09-04 MED ORDER — SERTRALINE HCL 50 MG PO TABS
3 refills | Status: CP
Start: 2017-09-04 — End: 2018-06-17

## 2017-09-12 DIAGNOSIS — M170 Bilateral primary osteoarthritis of knee: Secondary | ICD-10-CM

## 2017-09-12 DIAGNOSIS — E559 Vitamin D deficiency, unspecified: Secondary | ICD-10-CM

## 2017-09-12 DIAGNOSIS — M1710 Unilateral primary osteoarthritis, unspecified knee: Secondary | ICD-10-CM

## 2017-09-12 DIAGNOSIS — M7061 Trochanteric bursitis, right hip: Secondary | ICD-10-CM

## 2017-09-12 DIAGNOSIS — M3219 Other organ or system involvement in systemic lupus erythematosus: Principal | ICD-10-CM

## 2017-09-12 DIAGNOSIS — M797 Fibromyalgia: Secondary | ICD-10-CM

## 2017-09-16 DIAGNOSIS — R112 Nausea with vomiting, unspecified: Secondary | ICD-10-CM

## 2017-09-16 MED ORDER — ONDANSETRON 4 MG PO TBDP
8 mg | Freq: Three times a day (TID) | ORAL | 3 refills | Status: SS | PRN
Start: 2017-09-16 — End: 2018-08-15

## 2017-09-23 DIAGNOSIS — G43009 Migraine without aura, not intractable, without status migrainosus: Secondary | ICD-10-CM

## 2017-09-23 MED ORDER — ERENUMAB-AOOE 70 MG/ML SC SOAJ
70 mg | SUBCUTANEOUS | 11 refills | Status: CP
Start: 2017-09-23 — End: 2018-09-29

## 2017-09-26 ENCOUNTER — Encounter: Attending: Obstetrics & Gynecology | Primary: Internal Medicine

## 2017-10-09 ENCOUNTER — Encounter: Attending: Ophthalmology | Primary: Internal Medicine

## 2017-10-10 ENCOUNTER — Encounter: Attending: Obstetrics & Gynecology | Primary: Internal Medicine

## 2017-10-10 NOTE — Progress Notes
General:  {UFJP AMB EXAM GENERAL/CONSTITUTIONAL APPEARANCE:[phone]}  Psych: {UFJP AMB NEUROSURG DESC/NEUROLOGICAL/BRIGHT & ALERT:210279601::"bright and alert"} {UFJP AMB Z$ MOOD EXAM  [phone]::"mood and affect appear normal, does not appear depressed or anxious"}.  Skin: {UFJP AMB EXAM; WFUX:[id number]}.  CV: {heart exam:315510::"normal rate, regular rhythm, normal S1, S2, no murmurs, rubs, clicks or gallops"}.  Respiratory: {UFJP AMB EXAM ENDO LUNGS BRIEF URKY:7062376283}.  GI: {abd exam:315920::"Soft, nontender, nondistended. No masses or organomegaly."}, Hernia present? {YES/NO:26892}, Prior Abdominal scars? {YES/NO:26892}  Extremities: {extremities:315109::"Peripheral pulses present, nontender and nonedematous"}   Extremities:  Sensation Symmetric:{YES/NO:26892}  Sensation Equal:{YES/NO:26892}  Motor: {NL/ ABNL:19060}     Genitourinary Exam:   External genitalia: {UFJP AMB URO/GYN EXTERNAL GENITALIA:[phone]}   Urethra: {UFJP AMB EXAM; URO/FEMALE/URETHRAL MEATUS:[phone]}  Urethral hypermobility: {Y/N:120004}  Vagina: {UFJP AMB PE VAGINA INCL ABNORMAL DETAILS:[phone]}  Bladder:{Ex; Uro/[phone]}  Uterus: {Ex; Cvx_Uter:[phone]::"within normal limits"}   Cervix: {Ex; Cvx_Uter:[phone]::"within normal limits"}   Adnexae:{Ex; Cvx_Uter:[phone]::"within normal limits"}   Inguinal Nodes:{Ex; Cvx_Uter:[phone]::"within normal limits"}   Supine Stress Test:  {Negative/Positive:13464::"negative"} {UFJP AMB URO/GYN SUPINE STRESS [phone]} supine stress test with cough***/valsalva***    POPQ:  Aa *** Ba *** C ***   gh *** pb *** tvl ***   Ap *** Bp *** D ***   Prolapse corrected with ***    Anterior Wall Stage: {# 0-4:31231}  Apical/transverse Compartment Stage: {# 0-4:31231}  Posterior Compartment Stage: {# 0-4:31231}  Enterocele:{UFJP AMB YES/NO/WILD TDVVO:1607371062}    Focused GU Neuro:   Sensory S2, S3, S4 Right: ***normal; Left: ***normal

## 2017-10-10 NOTE — Progress Notes
Blbocavernous reflex Right: {YES/NO:26892};  Left: {YES/NO:26892}.  Anal wink Right:  {UFJP AMB YES/NO-[phone]};  Left:  {UFJP AMB YES/NO-[phone]}.  Levator ani:   Tone right: ***/5;  Left: ***/5  Contraction right: ***/5;  Left: ***/5  Taut muscle bands:  {UFJP AMB YES/NO-[phone]}.  Rectal Exam:   Prolapse: {UFJP AMB YES/NO-[phone]}  Hemorroids: {UFJP AMB YES/NO-[phone]}  Anal sphincter tone:  {# 0-5:31231}/5  Anal sphincter contraction:  {# 0-5:31231}/5  Anal sphincter intact: {UFJP AMB YES/NO-[phone]}.    ASSESSMENT:   51 y.o. G***P*** with:  - PVR done today indicates patient without signs of urinary retention.  - Urine dipstick -    PLAN:  - UA with micro and Urine culture done at this visit.  Will follow-up on these results and        treat if indicated.  - All options discussed with patient including conservative management with pelvic floor physical therapy, pessary use as well as surgical options.   - 3 Day Voiding Diary to be competed before next visit (instructions givent to pt)   - *** liquid intake  - Eliminate / reduce caffeine and other bladder irritants (list provided) for one week then slowly add back items to help identify sources for bladder irritation.   - Voiding drillls (timed voids reviewed and urge suppression technique taught to pt)  - Kegel maneuvers taught and reviewed with instructions to perform 3 sets of 10 repetitions.  - Recommend 25 grams daily fiber intake, bowel health and bowel recipe information       given.  Recommend squatty potty.   - Refer to ***  - This pt was offered to enroll in "MyChart" during this encounter  - Urogynecology follow-up: {follow up:15908}    This pt was seen and examined with the assistance of Cullen during this encounter.     The patient was given specific written information about her condition(s) and a print-out of her treatment plan.     Encounter time: I spent 60 minutes total face to face with

## 2017-10-10 NOTE — Progress Notes
Bowel Hx:     ? Number of bowel movements: {Numbers; 0-20:10930} per {DAYS/WEEKS/MONTHS:20327}  ? Patient stool type, [place] type:  {# 0-4:31231}  ? Constipation:  {YES/NO:26892}  ? Splinting; uses finger/hand to evacuate stool: {YES/NO:26892}  Describe:  ? Anal incontinence with stool: {YES/NO:26892}  If yes, liquid or solid stool:  ? Anal incontinence with gas: {YES/NO:26892}    Other Hx:  ? Pelvic pain: {YES/NO:26892}  ? Menopausal symptoms:   ? Hot flashes: {YES/NO:26892}  ? Vaginal Dryness: {YES/NO:26892}  ? Difficulty Sleeping: {YES/NO:26892}  ? Mood swings: {YES/NO:26892}    Sexual History:  ? Sexual Activity: {Sexual partners:5163}  ? Dyspareunia: {UFJP AMB DESC DYPAREUNIA NO/WITH:[phone]}.    Gynecological and Obstetrical History:   ? Last menstrual period: ***  ? G *** P***  ? Route of delivery: ***  Forceps: {YES/NO:26892} Episitotomy? {YES/NO:26892}  ? Largest baby: *** lbs  ? Gyn: {ROS PJK:[phone]::"KDXIPJ menses, no abnormal bleeding, pelvic pain or discharge","no breast pain or new or enlarging lumps on self exam"}  ? Contraception Use:{UFJP AMB PLAN CONTRACEPTION:210313104}  ? Hormone Therapy Use: {UFJP AMB MED HRT THERAPY:[phone]}  ? Menopausal status: {Menopause:31378}  ? Postmenopausal bleeding: {YES/NO:26892}    Past Medical History:   Past Medical History:   Diagnosis Date   ? Atrial fib/flutter, transient    ? Blood clot in vein    ? CAD (coronary artery disease)    ? Cerebral artery occlusion with cerebral infarction    ? COPD (chronic obstructive pulmonary disease)    ? Hyperlipemia    ? Hypertension    ? Lupus    ? Occasional tremors    ? Osteoarthritis    ? Personal history of noncompliance with medical treatment, presenting hazards to health    ? Pulmonary embolism    ? Sjogren's syndrome    ? TIA (transient ischemic attack)      Medications:  Current Outpatient Medications    Medication Sig Start Date End Date Taking? Authorizing Provider

## 2017-10-10 NOTE — Progress Notes
patient/patient?s family of which greater than half (>50%) was spent in counseling /coordination of care. Topics of discussion with patient included counseling and/or coordination of care including reviewing records and discussing DDx and treatment options.    Thank you for referring Nichole Colon to the [facility] and allowing for me to participate in her care. Please feel free to contact our office with any questions or concerns.

## 2017-10-10 NOTE — Progress Notes
Female Pelvic Medicine & Reconstructive Surgery (Urogynecology) Clinic    Pertinent Urogyn Hx:    Primary care Doctor: Gretchen Short    REASON FOR VISIT: OAB    HISTORY OF PRESENT ILLNESS:   The patient is a 51 y.o. G***P*** ***menopausal female, who is being seen today for OAB.    UROGYN Hx:  Pelvic Organ Prolapse:  ? Patient reports bulge symptoms including: ***    Urinary Incontinence:  ? Daytime voids: {Numbers; 0-40:17906}    ? Nighttime Voids:  {Numbers; 0-40:17906}    ? Patient routinely experiences {URINARY INCONTINENCE SYMPTOMS:210240701}.  ? Patient has urge of urination {YES/NO:26892}  ? Bladder irritants consumed: {YES/NO:26892}; List:   ? Patient has previously tried {UFJP AMB INCONTINENCE MEDICATIONS:210240401}.  ? Urge Urinary Incontince: {SYMPTOMS INCONTINENCE/URGENCY:[phone]}.  ? The patient has leaked urine with urgency for {Numbers; 0-20:10930} {DAYS/WEEKS/MONTHS:20327}   ? Number of UUI episodes in daytime: {Numbers; 0-40:17906}   Number of UUI episodes in nighttime: {Numbers; 0-40:17906}     ? Patient with stress urinary Incontinence: {YES/NO:26892}, If yes SUI occurs with:  {SYMPTOMS INCONTINENCE/STRESS:210269901}.  ? The patient has leaked urine with stress manuevers for {Numbers; 0-20:10930} {DAYS/WEEKS/MONTHS:20327}  ? Number of SUI episodes per day: {Numbers; 0-40:17906}     ? Unconscious leakage of urine:  {YES/NO:26892}  ? Nocturnal enuresis (bedwetting): {YES/NO:26892}, {Numbers; 0-40:17906} episodes per night  ? Number of pads used: {Numbers; 0-20:10930} pads per day  ? Type of Pads:  ? Sensation of incomplete emptying with voiding:  {YES/NO:26892}  ? Uses fingers/hand to empty bladder: {YES/NO:26892} Describe:  ? Dysuria:  {YES/NO:26892}  ? Has patient visualized gross blood in urine: {YES/NO:26892}  ? Recurrent urinary tract infections: {YES/NO:26892}  ? How many Urinary Tract Infections in previous 12 months: {Numbers; 0-20:10930}  ? Urine Stream as:  ? Normal ? Strong ? Weak

## 2017-10-10 NOTE — Progress Notes
09/04/17   Simonne Maffucci, PA-C   solifenacin (VESICARE) 10 MG PO Tablet Take 1 tablet by mouth daily. 07/11/17   Gretchen Short, MD   topiramate (TOPAMAX) 25 MG PO Capsule Sprinkle Take 3 caps twice daily 06/26/17   [name], ARNP   traMADol (ULTRAM) 50 MG PO Tablet Take 1 tablet by mouth every 8 hours as needed for pain. 04/03/17   Thway, Arlyss Gandy, MD   traZODone (DESYREL) 50 MG PO Tablet Take 1 tablet by mouth nightly at bedtime for 30 doses. 04/09/17 05/09/17  Orlean Patten, MD     Allergies:  Allergies   Allergen Reactions   ? No Known Drug Allergy      No Known Drug Allergies     Past Surgical History:   Past Surgical History:   Procedure Laterality Date   ? CHOLECYSTECTOMY      C-Section   ? CHOLECYSTECTOMY      Surgery Description: Cholecystectomy;   (Created by Conversion)     Social Hx:   ? Occupation:  ? Marital Status: {marital status:62}  ? Tobacco smoking: {YES/NO:26892}, Excessive alcohol use: {YES/NO:26892}, Drugs: {YES/NO:26892}, Domestic Violence: {YES/NO:26892}    Family Hx:   {YES/NO:26892} GU, {YES/NO:26892} GI or {YES/NO:26892} Breast cancers in immediate family.    Most Recent Health Screening:  ? Last Pap: *** Result: {pap results:16707::"no abnormalities"}  ? Last Mammogram: *** Result: {mammo results:13465}  ? Colonoscopy: {UFJP AMB DATE/RESULTS:[phone]}    The patient's complete medical history was reviewed, including medical diagnoses, surgery, medications, allergies, social and family history.    Medical records including clinical and/or surgical notes, laboratory and/or imaging results from an outside institution were thoroughly reviewed with the patient. {YES/NO:26892}    Review of Systems: All systems below were reviewed with patient and indicated below if positive.    UFJP AMB UROGYN ROS    OBJECTIVE: There were no vitals taken for this visit.    Scanner pvr:     *** cc  No results found for this visit on 10/10/17.

## 2017-10-10 NOTE — Progress Notes
butalbital-acetaminophen-caffeine (FIORICET, ESGIC) 50-325-40 MG PO Tablet Take 1-2 tabs at headache onset. May repeat 1 tab in 6 hours if needed. Do not treat more then 2 headaches per week. 01/21/17   [name], ARNP   cyclobenzaprine (FLEXERIL) 10 MG PO Tablet Take 1 tablet by mouth every 8 hours as needed for muscle spasms. 01/29/17   Thway, Arlyss Gandy, MD   cycloSPORINE (RESTASIS) 0.05 % OP Emulsion Place 1 drop into both eyes every 12 hours. 01/13/17   Liam Rogers, MD   diclofenac (VOLTAREN GEL) 1 % TD Gel Apply 4 g topically 4 times daily. 06/03/17   Thway, Arlyss Gandy, MD   dilTIAZem (DILT-XR) 180 MG PO Capsule Extended Release 24 Hour TAKE 1 CAPSULE BY MOUTH DAILY 01/15/17   Gretchen Short, MD   Erenumab-aooe (AIMOVIG) 70 MG/ML SC Solution Auto-injector Inject 70 mg into the skin every 30 days. 09/23/17   [name], ARNP   ergocalciferol (vitamin D-2) 50000 units PO Capsule TAKE 1 CAPSULE BY MOUTH 1 TIME A WEEK ON AN EMPTY STOMACH 01/15/17   Gretchen Short, MD   etodolac (LODINE) 400 MG PO Tablet Take 1 tablet by mouth 2 times daily. prn 06/03/17   Thway, Arlyss Gandy, MD   gabapentin (NEURONTIN) 600 MG PO Tablet TAKE 1 TABLET BY MOUTH THREE TIMES DAILY 07/17/17   Gretchen Short, MD   hydroCHLOROthiazide (HYDRODIURIL) 25 MG PO Tablet TAKE 1 TABLET BY MOUTH DAILY 01/15/17   Gretchen Short, MD   hydroxychloroquine (PLAQUENIL) 200 MG PO Tablet Take 1 tablet by mouth 2 times daily. 08/25/17   Thway, Myint Myat, MD   ondansetron (ZOFRAN-ODT) 4 MG PO Tablet Disintegrating Dissolve 2 tablets in mouth every 8 hours as needed. 09/16/17   Marthe Patch, ARNP   pilocarpine (SALAGEN) 5 MG PO Tablet Take 1 tablet by mouth 2 times daily. 04/16/17   Thway, Arlyss Gandy, MD   prednisoLONE acetate (PRED FORTE) 1 % OP Suspension Place 1 drop into both eyes 4 times daily for 14 days. 01/14/17 01/28/17  Verdene Lennert, MD   sertraline (ZOLOFT) 50 MG PO Tablet TAKE 1 TABLET BY MOUTH EVERY DAY

## 2017-10-12 DIAGNOSIS — G894 Chronic pain syndrome: Principal | ICD-10-CM

## 2017-10-13 ENCOUNTER — Ambulatory Visit: Attending: Internal Medicine | Primary: Internal Medicine

## 2017-10-13 DIAGNOSIS — M3219 Other organ or system involvement in systemic lupus erythematosus: Principal | ICD-10-CM

## 2017-10-13 DIAGNOSIS — M170 Bilateral primary osteoarthritis of knee: Secondary | ICD-10-CM

## 2017-10-13 DIAGNOSIS — K219 Gastro-esophageal reflux disease without esophagitis: Secondary | ICD-10-CM

## 2017-10-13 DIAGNOSIS — M1710 Unilateral primary osteoarthritis, unspecified knee: Secondary | ICD-10-CM

## 2017-10-13 DIAGNOSIS — M3500 Sicca syndrome, unspecified: Secondary | ICD-10-CM

## 2017-10-13 DIAGNOSIS — M797 Fibromyalgia: Secondary | ICD-10-CM

## 2017-10-13 DIAGNOSIS — M2550 Pain in unspecified joint: Secondary | ICD-10-CM

## 2017-10-13 MED ORDER — OMEPRAZOLE MAGNESIUM 20 MG PO TBEC
20 mg | Freq: Two times a day (BID) | ORAL | 1 refills | Status: CP
Start: 2017-10-13 — End: 2017-10-15

## 2017-10-13 MED ORDER — DICLOFENAC SODIUM 1 % TD GEL
4 g | Freq: Four times a day (QID) | TOPICAL | 2 refills | Status: CP
Start: 2017-10-13 — End: 2018-06-17

## 2017-10-13 MED ORDER — CYCLOBENZAPRINE HCL 10 MG PO TABS
10 mg | Freq: Three times a day (TID) | ORAL | 1 refills | PRN
Start: 2017-10-13 — End: 2018-06-29

## 2017-10-13 MED ORDER — PILOCARPINE HCL 5 MG PO TABS
5 mg | Freq: Two times a day (BID) | ORAL | 3 refills | Status: CP
Start: 2017-10-13 — End: ?

## 2017-10-13 MED ORDER — GABAPENTIN 600 MG PO TABS
0 refills | Status: CP
Start: 2017-10-13 — End: 2018-01-19

## 2017-10-13 MED ORDER — MAGNESIUM OXIDE 400 (241.3 MG) MG PO TABS
400 mg | Freq: Every day | ORAL | Status: SS
Start: 2017-10-13 — End: 2018-08-15

## 2017-10-13 NOTE — Progress Notes
FMS: Reports poor sleep with trouble falling asleep and staying asleep as well as daytime fatigue.  Pt counseled on need for better sleep hygiene including setting a scheduled bedtime, no day time napping, appropriate bedroom environment as well scheduled exercise program ( a least 30 mins of activity daily)  Currently on gabapentin and Zoloft, flexeril,  tramadol prn ( rarely take it)     Myint Loni Beckwith ,MD  Assistant  Professor of Medicine,   Division of Rheumatology,  [place] [facility] [place], [place].

## 2017-10-13 NOTE — Progress Notes
?   Smokeless tobacco: Never Used   ? Alcohol use No   ? Drug use: No   ? Sexual activity: Not Currently     Other Topics Concern   ? Not on file     Social History Narrative   ? No narrative on file       Physical Exam:   BP 126/84  - Pulse 64  - Temp 36.9 ?C (98.5 ?F)  - Resp 16  - Ht 1.702 m (5\' 7" )  - Wt 87.5 kg (193 lb)  - BMI 30.23 kg/m?       General appearance  alert, cooperative, no distress, appears stated age, obese    Psychiatric/Behavioral:  Negative for behavioral problems, confusion and agitation.          Head  Normocephalic, without obvious abnormality, no scalp lesion  normal hair distribution   Eyes  conjunctivae/corneas clear. PERTL, EOMI    HEENT Lips, mucosa very  dry  .no oral ulceration   Neck supple, symmetrical, trachea midline, no adenopathy,thyroid: not enlarged,  no JVD   Back   Normal posture, alignment  +  tenderness to palpation of spinous processes.? muscle spasm in neck and trapezius   ?   Lungs   Not tachypnea . Normal respiratory effort  clear to auscultation bilaterally       Heart  regular rate and rhythm, S1, S2 normal, no murmur, click, rub or gallop   Abdomen   soft, non-tender. Bowel sounds normal. No masses,  No organomegaly   MSK No objective  synovitis , dactylitis in complete exam.  FMS tender point 16/18*  Bilat knees crepitus with movement   Right knee - seems small effusion + , full ROM    Extremities extremities normal, atraumatic, no cyanosis or edema   Pulses 2+ and symmetric   Skin Skin color, texture, turgor normal. No rashes or lesions   Lymph nodes Cervical, supraclavicular, and axillary nodes normal.   Neurologic MENTAL STATUS: Alert. Oriented to person, place, and time. Intact recent and remote memory. No aphasia.   CRANIAL NERVES: Cranial nerves II-XII were normal   No motor /sensory deficit        Lab or Diagnostic Testing:     Results for orders placed or performed in visit on 09/12/17   Comprehensive Metabolic Panel (All Labs)   Result Value Ref Range

## 2017-10-13 NOTE — Progress Notes
MCHC 32.1 32.0 - 36.0 g/dL    RDW 13.2 11.0 - 15.0 %    Platelets 238 140 - 400 Thousand/uL    MPV 10.4 7.5 - 12.5 fL    Neutrophils Absolute 1,348 (L) 1,500 - 7,800 cells/uL    Lymphocytes Absolute 1,820 850 - 3,900 cells/uL    Monocytes Absolute 161 (L) 200 - 950 cells/uL    Eosinophils Absolute 161 15 - 500 cells/uL    Basophils Absolute 11 0 - 200 cells/uL    Neutrophils 38.5 %    LYMPHOCYTES 52.0 %    MONOCYTES 4.6 %    EOSINOPHILS 4.6 %    BASOPHILS 0.3 %       Results for Nichole Colon, Nichole Colon (MRN [id number]) as of 06/03/2017 08:44   Ref. Range 12/25/2016 09:01   Hep A IgM Latest Ref Range: NON-REACTIVE  NON-REACTIVE   HEPATITIS B CORE ANTIBODY (IGM) Latest Ref Range: NON-REACTIVE  NON-REACTIVE   HEPATITIS B SURFACE ANTIGEN Latest Ref Range: NON-REACTIVE  NON-REACTIVE   HEPATITIS C ANTIBODY Latest Ref Range: NON-REACTIVE  NON-REACTIVE       [facility] records -Last visit march 30  SLE Dx 2010 , anti SSA, SSB + , borderline anti ds dna, diffuse arthralgia since 2007  FMS  Complex migraine  But in 2010 labs RF 25, SSA 118, SSB 26, Ds DNA 200 -  Ineffecive response to Plaquenil    Rx SSZ 1000mg  bid (started 3/15)  Lack of response to tramadol  Gabapentin 600mg  tid  Pred short taper   MRI lumbar spine -NO sig   One episode of WBC 3.7       Radiology:    05/27/15  [facility]   Electrophysiologic findings:  1. Normal sural sensory conduction studies bilaterally.  2. Absent superficial peroneal sensory responses bilaterally. The symmetric absence responses a normal finding.  3. Reduced amplitude left tibial motor response with a normal distal latency. The right tibial motor conduction was normal.  4. Normal common peroneal motor conductions bilaterally.  5. Absent common peroneal F-wave responses bilaterally. The symmetric absence of responses is sometimes a normal finding and female patients.  6. Prolonged right tibial F-wave latency. The left tibial F-wave latency was normal.  7. Prolonged tibial H reflex responses bilaterally.  ?

## 2017-10-13 NOTE — Progress Notes
-   discussed risk of toxicity and possible signs/symptoms with patient  - continue per primary/rheumatology    ASSESSMENT:     Problem List Items Addressed This Visit     Systemic lupus erythematosus (CMS-HCC code)    Osteoarthrosis involving lower leg    Relevant Medications    diclofenac (VOLTAREN GEL) 1 % TD Gel    cyclobenzaprine (FLEXERIL) 10 MG PO Tablet    Fibromyalgia    Relevant Medications    cyclobenzaprine (FLEXERIL) 10 MG PO Tablet      Other Visit Diagnoses     Sjogren's syndrome, with unspecified organ involvement (CMS-HCC code)        Arthralgia, unspecified joint        Chronic GERD        Relevant Medications    omeprazole (PriLOSEC OTC) 20 MG PO Tablet Delayed Release    Primary osteoarthritis of both knees        Relevant Medications    diclofenac (VOLTAREN GEL) 1 % TD Gel    cyclobenzaprine (FLEXERIL) 10 MG PO Tablet    Sicca syndrome (CMS-HCC code)        Relevant Medications    pilocarpine (SALAGEN) 5 MG PO Tablet          PLAN:     Counseling was given regarding impression.     Sjogren's /SLE: She has Pos ANA 1:1280 in 2012. Previously treated with PLaquenil  and SSZ at [facility] .    + SSA , negative SSB, mildly low C4 and low level pos anti Ds DNA .Negative RF and ant CCP.    She is clinically stable . As well as labs  consistent with low disease activity .  Won't add prednisone   She is on Lodine prn    Will continue Plaquenil , Eye exam 01/2017 ( after stopping Plaquenil for a month because no follow up )   Trying to avoid steroid because of weight gain    Follow up in 3 months with prior labs     # Sicca symptoms ;  Pt counseled on the need for regular Dental Appointments every 6 months    # Bilat knee pain ; she is afraid to walk which caused increased pain . Will get Xray . Start NSAID with stomach protection .  Continue voltern gel   Will get PT for both knee and trochenteric pain .      # Right trochentric bursitis ; injection given  In past

## 2017-10-13 NOTE — Progress Notes
Needle EMG: EMG in the left leg was normal in the tibialis anterior and gastrocnemius muscles.  ?  Impression: Electrophysiologic study was abnormal with findings that seem to be most consistent with mild lumbosacral radicular dysfunction as evidenced by the prolonged late responses, low amplitude distal motor response but normal sensory conductions. The absence of any EMG abnormality indicates that there is not significant motor axon loss associated. We did not find any evidence here to suggest that there is a large fiber peripheral neuropathy. Her symptoms do not seem to be consistent with a small fiber neuropathy    Eye exam (01/2017)     Glaucoma suspect, bilateral  - (+) family history: mother, mGm, mAunt   - African American   - HVF 24-2 01/2017: possible bilateral nasal defects with rim artifact OS  - IOP has been wnl, continue to observe for now      Sjogren's syndrome with keratoconjunctivitis sicca  - continue follow up with rheumatology  - recommend artificial tears QID OU  - discussed use of preservative free artificial tears if using more frequently   - discussed minimizing environmental factors    ?  Long-term use of Plaquenil  - currently off plaquenil   - no evidence of maculopathy at this time   - patient currently on 400 mg Plaquenil x 4-5 years  - real body weight: 81.6 kg and daily dose 4.9 mg/kg  - Patient's major risk factors for toxicity:                         - >5.0 mg/kg real weight: no                        - >5 years of use: almost, 5 years per history                        - concurrent renal disease: no                        - concurrent tamoxifen use: no                        - pre-existing macular disease: no  - Screening                        - Baseline HVF (10-2 white SITA): needs to be performed                        - Baseline SD OCT 12/2016: wnl OU                         - recommend annual screening with repeat testing after 5 years of exposure

## 2017-10-13 NOTE — Progress Notes
dysphagia, dyspnea, chest pain,SOB,  abdominal pain, blood in stool ,easy bruising  paraesthesia, weakness or Raynaud's  + nausea and heart burn     + sicca especially dry mouth   + pain and fatigue     ROS FOR FMS: Patient is reviewed for fibromyalgia symptoms.  Sleep difficulty +   Exercise :  None.      Associated symptoms:   Migraine headaches.    Irritable bowel symptoms.  ,Depression:      Review Of Systems: All other ROS were reviewed and were unremarkable  Outpatient Medications:  Current Outpatient Medications    Medication Sig Start Date End Date Taking? Authorizing Provider   butalbital-acetaminophen-caffeine (FIORICET, ESGIC) 50-325-40 MG PO Tablet Take 1-2 tabs at headache onset. May repeat 1 tab in 6 hours if needed. Do not treat more then 2 headaches per week. 01/21/17  Yes [name], ARNP   cyclobenzaprine (FLEXERIL) 10 MG PO Tablet Take 1 tablet by mouth every 8 hours as needed for muscle spasms. 10/13/17  Yes Thway, Myint Vic Blackbird, MD   cyclobenzaprine (FLEXERIL) 10 MG PO Tablet Take 1 tablet by mouth every 8 hours as needed for muscle spasms. 01/29/17 10/13/17 Yes Thway, Myint Myat, MD   cycloSPORINE (RESTASIS) 0.05 % OP Emulsion Place 1 drop into both eyes every 12 hours. 01/13/17  Yes Liam Rogers, MD   diclofenac (VOLTAREN GEL) 1 % TD Gel Apply 4 g topically 4 times daily. 10/13/17  Yes Thway, Myint Vic Blackbird, MD   diclofenac (VOLTAREN GEL) 1 % TD Gel Apply 4 g topically 4 times daily. 06/03/17 10/13/17 Yes Thway, Myint Myat, MD   dilTIAZem (DILT-XR) 180 MG PO Capsule Extended Release 24 Hour TAKE 1 CAPSULE BY MOUTH DAILY 01/15/17  Yes Gretchen Short, MD   Erenumab-aooe (AIMOVIG) 70 MG/ML SC Solution Auto-injector Inject 70 mg into the skin every 30 days. 09/23/17  Yes [name], ARNP   ergocalciferol (vitamin D-2) 50000 units PO Capsule TAKE 1 CAPSULE BY MOUTH 1 TIME A WEEK ON AN EMPTY STOMACH 01/15/17  Yes Gretchen Short, MD

## 2017-10-13 NOTE — Progress Notes
Past Medical History:   Diagnosis Date   ? Atrial fib/flutter, transient    ? Blood clot in vein    ? CAD (coronary artery disease)    ? Cerebral artery occlusion with cerebral infarction    ? COPD (chronic obstructive pulmonary disease)    ? Hyperlipemia    ? Hypertension    ? Lupus    ? Occasional tremors    ? Osteoarthritis    ? Personal history of noncompliance with medical treatment, presenting hazards to health    ? Pulmonary embolism    ? Sjogren's syndrome    ? TIA (transient ischemic attack)        PSH:  Past Surgical History:   Procedure Laterality Date   ? CHOLECYSTECTOMY      C-Section   ? CHOLECYSTECTOMY      Surgery Description: Cholecystectomy;   (Created by Conversion)       Family History:   No Rheumatological family history    Family History   Problem Relation Age of Onset   ? Asthma Mother    ? Heart Disease Mother    ? Hypertension Mother    ? Stroke Mother    ? Vision Loss Mother    ? Asthma Brother    ? Birth Defects Brother    ? Early Death Brother    ? Heart Disease Brother    ? High Cholesterol Brother    ? Hypertension Brother    ? Asthma Daughter    ? High Cholesterol Daughter    ? Hypertension Daughter    ? Asthma Maternal Aunt    ? Heart Disease Maternal Aunt    ? Hypertension Maternal Aunt    ? Miscarriages / Stillbirths Maternal Aunt    ? Stroke Maternal Aunt    ? Vision Loss Maternal Aunt    ? Heart Disease Maternal Uncle    ? Hypertension Maternal Uncle    ? Stroke Maternal Uncle    ? Arthritis Maternal Grandmother    ? Asthma Maternal Grandmother    ? Clotting Disorder Maternal Grandmother    ? Heart Disease Maternal Grandmother    ? Hypertension Maternal Grandmother    ? Miscarriages / Stillbirths Maternal Grandmother    ? Vision Loss Maternal Grandmother        Social History:     Social History     Social History   ? Marital status: Single     Spouse name: N/A   ? Number of children: N/A   ? Years of education: N/A     Social History Main Topics   ? Smoking status: Never Smoker

## 2017-10-13 NOTE — Progress Notes
complaints during that time or any surgical procedure. Per pt she still has not see the Ophthalmologist and ran out of her plaquenil about 1 week ago. She also has complaints of continued Sicca symptoms as well as sharp shooting pains in her upper back and shoulders for the past day.    01/29/17   C/o fatigue and pain all over . We stopped Plaquenil last visit because no eye exam . Now she was seen  Recently and no maculopathy .  Her treatment for fibromyalgia is adjused in last visit .     04/03/17  RTC for SLE ,   Pred 5mg  daily for 1 month done and had labs after  Which seems better . No leucopenia, complements normal . Lower DsDNA . C/o pain and weight gain . Pain especially in knees and right lateral hip.  Other ROS negative except fatigue weight gain and joint pain .    10/13/17  RTC 4  months . SLE/Sjogren's currently on Plaquenil bid .  She c/o knee pain and right later hip and back  Had right throchenteric injection  4/18 . Pain was better for few weeka only  . Bilat knees making sound as well as so painful .Had Xrays last visit , showing mild OA . No inflammation . No fever , rash   Labs reviewed Ds DNA  Even better  . Normal complement . Total WBC is slightly low but normal Lymphocyte .     The pain is At   Knees ,  Can't sleep sometimes  no swelling Morning stiffness and tender + . Right hip , ( lateral thigh ) can't walk well , can't sleep on that side   Patient describes the level of pain as  7 of 10.   The pain was first noticed    6 Years     It usually lasts  * always there.   When is the problem worst?  No specific time   Does anything make the problem worse?  moving around, walking ,standing , driving ,lying on my side   Does anything help or make the problem better?no     CONSTITUTIONAL SYMPTOMS:No Fever, Weight Loss or Night Sweats , +++fatigue    Review of systems for connective tissue disease revealed:   No Photosensitivity,malar rash ,discoid rash , hair loss, mouth ulcer ,

## 2017-10-13 NOTE — Progress Notes
Glucose 83 65 - 99 mg/dL    Urea Nitrogen 13 7 - 25 mg/dL    Creatinine 0.78 0.50 - 1.05 mg/dL    EGFR 88 > OR = 60 mL/min/1.59m    Glom Filt Rate, Est African American 102 > OR = 60 mL/min/1.767m   BUN/Creatinine Ratio NOT APPLICABLE 6 - 22 (calc)    Sodium 141 135 - 146 mmol/L    Potassium 4.0 3.5 - 5.3 mmol/L    Chloride 104 98 - 110 mmol/L    CARBON DIOXIDE 30 20 - 32 mmol/L    Calcium 9.8 8.6 - 10.4 mg/dL    Protein, Total 7.5 6.1 - 8.1 g/dL    ALBUMIN 3.9 3.6 - 5.1 g/dL    Globulin 3.6 1.9 - 3.7 g/dL (calc)    ALBUMIN/GLOBULIN RATIO 1.1 1.0 - 2.5 (calc)    Total Bilirubin 0.4 0.2 - 1.2 mg/dL    Alkaline Phosphatase 57 33 - 130 U/L    AST 15 10 - 35 U/L    ALT 7 6 - 29 U/L   C3 Complement (All Labs)   Result Value Ref Range    C3 Complement 117 83 - 193 mg/dL   C4 Complement (All Labs)   Result Value Ref Range    C4 Complement 26 15 - 57 mg/dL   Sedimentation Rate ESR (All Labs)   Result Value Ref Range    SED RATE BY MODIFIED WESTERGREN 25 < OR = 30 mm/h   Urinalysis W Microscopy ([facility])   Result Value Ref Range    Color -Ur YELLOW YELLOW    APPEARANCE CLEAR CLEAR    Specific Gravity -Ur 1.012 1.001 - 1.035    pH -Ur 8.0 5.0 - 8.0    Glucose -Ur NEGATIVE NEGATIVE    Bilirubin -Ur NEGATIVE NEGATIVE    Ketones UA NEGATIVE NEGATIVE    Hb -Ur NEGATIVE NEGATIVE    Protein-UA NEGATIVE NEGATIVE    NITRITE NEGATIVE NEGATIVE    Leukocyte Esterase -Ur 2+ (A) NEGATIVE    WBC -Ur 0-5 < OR = 5 /HPF    RBC -Ur NONE SEEN < OR = 2 /HPF    Squam Epithel, UA 6-10 (A) < OR = 5 /HPF    BACTERIA FEW (A) NONE SEEN /HPF    Hyaline Casts, UA NONE SEEN NONE SEEN /LPF   Anti-double stranded DNA   Result Value Ref Range    dsDNA Ab 16 (H) IU/mL   CBC and Differential (All Labs)   Result Value Ref Range    WHITE BLOOD CELL COUNT 3.5 (L) 3.8 - 10.8 Thousand/uL    RBC 4.51 3.80 - 5.10 Million/uL    Hemoglobin 12.3 11.7 - 15.5 g/dL    Hematocrit 38.3 35.0 - 45.0 %    MCV 84.9 80.0 - 100.0 fL    MCH 27.3 27.0 - 33.0 pg

## 2017-10-13 NOTE — Progress Notes
Department of Rheumatology    Patient: Nichole Colon  MRN: [id number]  DOB: 12/26/1965      Chief Complaint:  3 Month Follow Up    Patient Active Problem List   Diagnosis   ? Plantar fascial fibromatosis   ? Systemic lupus erythematosus (CMS-HCC code)   ? Osteoarthrosis involving lower leg   ? Other disorder of menstruation and other abnormal bleeding from female genital tract   ? Unspecified transient cerebral ischemia   ? Other chronic pain   ? Other malaise and fatigue   ? Allergy, unspecified not elsewhere classified   ? Chronic airway obstruction, not elsewhere classified   ? Essential hypertension   ? Gonococcal infection (acute) of lower genitourinary tract   ? Other and unspecified hyperlipidemia   ? Sjogrens syndrome (CMS-HCC code)   ? Fibromyalgia   ? Migraine without aura and without status migrainosus, not intractable   ? Chronic bilateral low back pain without sciatica   ? OAB (overactive bladder)   ? Body mass index 28.0-28.9, adult   ? Chest pain   ? Esophageal dysphagia       History of Present Illness:     Nichole Colon is an 51 y.o. AA female who is here for follow up  SLE/ Sjogren's  Disease and fibromyalgia .    She saw  Dr Barrie Folk for several years and established Dx of lupus/Sjogren's, Fibromyalgia and OA around 2010  .   Her last visit  With him was 03/14/14 until insurance changed   She was treated with Plaquenil then switched to SSZ . She was on SSZ 2 months and stopped because of nausea . Plaqunil  also gave her dry skin and it was stopped .  She was not on any med in her first visit here 03/06/15  except for FMS.  Neurontin, baclofen and mobic prn   Per [facility] note that time ,  SLE was Dx  (2010) anti SSA, SSB  + , borderline Ds DNA , negative APL study   She was restarted PLq and salagen (03/30/2015)  .    12/24/16   She is here for follow up. Last seen March 2017. Reports that she was hospitalized 2 times since last being seen for her IBS-. Denies any other

## 2017-10-13 NOTE — Progress Notes
etodolac (LODINE) 400 MG PO Tablet Take 1 tablet by mouth 2 times daily. prn 06/03/17  Yes Thway, Myint Myat, MD   gabapentin (NEURONTIN) 600 MG PO Tablet TAKE 1 TABLET BY MOUTH THREE TIMES DAILY 10/13/17  Yes Simonne Maffucci, PA-C   hydroCHLOROthiazide (HYDRODIURIL) 25 MG PO Tablet TAKE 1 TABLET BY MOUTH DAILY 01/15/17  Yes Gretchen Short, MD   hydroxychloroquine (PLAQUENIL) 200 MG PO Tablet Take 1 tablet by mouth 2 times daily. 08/25/17  Yes Thway, Myint Myat, MD   magnesium oxide (MAG-OX) 400 (241.3 Mg) MG PO Tablet Take 400 mg by mouth daily.   Yes Information, Historical   ondansetron (ZOFRAN-ODT) 4 MG PO Tablet Disintegrating Dissolve 2 tablets in mouth every 8 hours as needed. 09/16/17  Yes Marthe Patch, ARNP   pilocarpine (SALAGEN) 5 MG PO Tablet Take 1 tablet by mouth 2 times daily. 10/13/17  Yes Thway, Myint Myat, MD   pilocarpine (SALAGEN) 5 MG PO Tablet Take 1 tablet by mouth 2 times daily. 04/16/17 10/13/17 Yes Thway, Myint Myat, MD   sertraline (ZOLOFT) 50 MG PO Tablet TAKE 1 TABLET BY MOUTH EVERY DAY 09/04/17  Yes Simonne Maffucci, PA-C   solifenacin (VESICARE) 10 MG PO Tablet Take 1 tablet by mouth daily. 07/11/17  Yes Gretchen Short, MD   topiramate (TOPAMAX) 25 MG PO Capsule Sprinkle Take 3 caps twice daily 06/26/17  Yes [name], ARNP   traMADol (ULTRAM) 50 MG PO Tablet Take 1 tablet by mouth every 8 hours as needed for pain. 04/03/17  Yes Thway, Myint Vic Blackbird, MD   omeprazole (PriLOSEC OTC) 20 MG PO Tablet Delayed Release Take 1 tablet by mouth 2 times daily for 30 days. 10/13/17 11/12/17  Thway, Arlyss Gandy, MD   prednisoLONE acetate (PRED FORTE) 1 % OP Suspension Place 1 drop into both eyes 4 times daily for 14 days. 01/14/17 01/28/17  Verdene Lennert, MD   traZODone (DESYREL) 50 MG PO Tablet Take 1 tablet by mouth nightly at bedtime for 30 doses. 04/09/17 05/09/17  Orlean Patten, MD       Allergies:  Allergies   Allergen Reactions   ? No Known Drug Allergy      No Known Drug Allergies       PMH:

## 2017-10-15 DIAGNOSIS — K219 Gastro-esophageal reflux disease without esophagitis: Principal | ICD-10-CM

## 2017-10-15 MED ORDER — OMEPRAZOLE MAGNESIUM 20 MG PO TBEC
20 mg | Freq: Two times a day (BID) | ORAL | 1 refills | Status: CP
Start: 2017-10-15 — End: 2018-06-17

## 2017-11-12 ENCOUNTER — Ambulatory Visit: Admit: 2017-11-12 | Discharge: 2017-11-12 | Attending: Acute Care | Primary: Internal Medicine

## 2017-11-12 DIAGNOSIS — I2510 Atherosclerotic heart disease of native coronary artery without angina pectoris: Secondary | ICD-10-CM

## 2017-11-12 DIAGNOSIS — J449 Chronic obstructive pulmonary disease, unspecified: Secondary | ICD-10-CM

## 2017-11-12 DIAGNOSIS — M329 Systemic lupus erythematosus, unspecified: Secondary | ICD-10-CM

## 2017-11-12 DIAGNOSIS — M3500 Sicca syndrome, unspecified: Secondary | ICD-10-CM

## 2017-11-12 DIAGNOSIS — I10 Essential (primary) hypertension: Secondary | ICD-10-CM

## 2017-11-12 DIAGNOSIS — I6350 Cerebral infarction due to unspecified occlusion or stenosis of unspecified cerebral artery: Secondary | ICD-10-CM

## 2017-11-12 DIAGNOSIS — Z823 Family history of stroke: Secondary | ICD-10-CM

## 2017-11-12 DIAGNOSIS — Z9119 Patient's noncompliance with other medical treatment and regimen: Secondary | ICD-10-CM

## 2017-11-12 DIAGNOSIS — I4891 Unspecified atrial fibrillation: Secondary | ICD-10-CM

## 2017-11-12 DIAGNOSIS — G43009 Migraine without aura, not intractable, without status migrainosus: Principal | ICD-10-CM

## 2017-11-12 DIAGNOSIS — Z9049 Acquired absence of other specified parts of digestive tract: Secondary | ICD-10-CM

## 2017-11-12 DIAGNOSIS — E785 Hyperlipidemia, unspecified: Secondary | ICD-10-CM

## 2017-11-12 DIAGNOSIS — M797 Fibromyalgia: Secondary | ICD-10-CM

## 2017-11-12 DIAGNOSIS — IMO0002 Atrial fib/flutter, transient: Secondary | ICD-10-CM

## 2017-11-12 DIAGNOSIS — Z8673 Personal history of transient ischemic attack (TIA), and cerebral infarction without residual deficits: Secondary | ICD-10-CM

## 2017-11-12 DIAGNOSIS — G459 Transient cerebral ischemic attack, unspecified: Secondary | ICD-10-CM

## 2017-11-12 DIAGNOSIS — R251 Tremor, unspecified: Secondary | ICD-10-CM

## 2017-11-12 DIAGNOSIS — M1990 Unspecified osteoarthritis, unspecified site: Secondary | ICD-10-CM

## 2017-11-12 DIAGNOSIS — G43909 Migraine, unspecified, not intractable, without status migrainosus: Secondary | ICD-10-CM

## 2017-11-12 DIAGNOSIS — Z79899 Other long term (current) drug therapy: Secondary | ICD-10-CM

## 2017-11-12 DIAGNOSIS — I8290 Acute embolism and thrombosis of unspecified vein: Secondary | ICD-10-CM

## 2017-11-12 DIAGNOSIS — I2699 Other pulmonary embolism without acute cor pulmonale: Secondary | ICD-10-CM

## 2017-11-12 DIAGNOSIS — Z86711 Personal history of pulmonary embolism: Secondary | ICD-10-CM

## 2017-12-05 DIAGNOSIS — M170 Bilateral primary osteoarthritis of knee: Secondary | ICD-10-CM

## 2017-12-05 DIAGNOSIS — E559 Vitamin D deficiency, unspecified: Secondary | ICD-10-CM

## 2017-12-05 DIAGNOSIS — M1710 Unilateral primary osteoarthritis, unspecified knee: Secondary | ICD-10-CM

## 2017-12-05 DIAGNOSIS — M797 Fibromyalgia: Secondary | ICD-10-CM

## 2017-12-05 DIAGNOSIS — M3219 Other organ or system involvement in systemic lupus erythematosus: Principal | ICD-10-CM

## 2017-12-05 DIAGNOSIS — M7061 Trochanteric bursitis, right hip: Secondary | ICD-10-CM

## 2018-01-12 ENCOUNTER — Encounter: Attending: Internal Medicine | Primary: Internal Medicine

## 2018-01-18 DIAGNOSIS — G894 Chronic pain syndrome: Principal | ICD-10-CM

## 2018-01-18 MED ORDER — GABAPENTIN 600 MG PO TABS
0 refills | Status: CP
Start: 2018-01-18 — End: 2018-04-29

## 2018-01-21 ENCOUNTER — Ambulatory Visit: Attending: Internal Medicine | Primary: Internal Medicine

## 2018-01-21 DIAGNOSIS — Z79899 Other long term (current) drug therapy: Secondary | ICD-10-CM

## 2018-01-21 DIAGNOSIS — M3500 Sicca syndrome, unspecified: Principal | ICD-10-CM

## 2018-01-21 DIAGNOSIS — M25461 Effusion, right knee: Secondary | ICD-10-CM

## 2018-01-21 DIAGNOSIS — M170 Bilateral primary osteoarthritis of knee: Secondary | ICD-10-CM

## 2018-01-21 DIAGNOSIS — M659 Synovitis and tenosynovitis, unspecified: Secondary | ICD-10-CM

## 2018-01-21 DIAGNOSIS — M797 Fibromyalgia: Secondary | ICD-10-CM

## 2018-01-21 MED ORDER — PREDNISONE 5 MG PO TABS
0 refills
Start: 2018-01-21 — End: 2018-06-29

## 2018-01-21 MED ORDER — TRAMADOL HCL 50 MG PO TABS
50 mg | Freq: Three times a day (TID) | ORAL | 0 refills | Status: CP | PRN
Start: 2018-01-21 — End: 2018-06-17

## 2018-01-21 NOTE — Progress Notes
Does anything make the problem worse?  moving around, walking ,standing , driving ,lying on my side   Does anything help or make the problem better?no     CONSTITUTIONAL SYMPTOMS:No Fever, Weight Loss or Night Sweats , +++fatigue    Review of systems for connective tissue disease revealed:   No Photosensitivity,malar rash ,discoid rash , hair loss, mouth ulcer , dysphagia, dyspnea, chest pain,SOB,  abdominal pain, blood in stool ,easy bruising  paraesthesia, weakness or Raynaud's  + nausea and heart burn     + sicca especially dry mouth   + pain and fatigue     ROS FOR FMS: Patient is reviewed for fibromyalgia symptoms.  Sleep difficulty +   Exercise :  None.      Associated symptoms:   Migraine headaches.    Irritable bowel symptoms.  ,Depression:      Review Of Systems: All other ROS were reviewed and were unremarkable  Outpatient Medications:  Current Outpatient Medications    Medication Sig Start Date End Date Taking? Authorizing Provider   butalbital-acetaminophen-caffeine (FIORICET, ESGIC) 50-325-40 MG PO Tablet Take 1-2 tabs at headache onset. May repeat 1 tab in 6 hours if needed. Do not treat more then 2 headaches per week. 01/21/17  Yes [name], APRN   cyclobenzaprine (FLEXERIL) 10 MG PO Tablet Take 1 tablet by mouth every 8 hours as needed for muscle spasms. 10/13/17  Yes Thway, Myint Myat, MD   cycloSPORINE (RESTASIS) 0.05 % OP Emulsion Place 1 drop into both eyes every 12 hours. 01/13/17  Yes Liam Rogers, MD   diclofenac (VOLTAREN GEL) 1 % TD Gel Apply 4 g topically 4 times daily. 10/13/17  Yes Thway, Myint Myat, MD   dilTIAZem (DILT-XR) 180 MG PO Capsule Extended Release 24 Hour TAKE 1 CAPSULE BY MOUTH DAILY 01/15/17  Yes Gretchen Short, MD   Erenumab-aooe (AIMOVIG) 70 MG/ML SC Solution Auto-injector Inject 70 mg into the skin every 30 days. 09/23/17  Yes [name], APRN   ergocalciferol (vitamin D-2) 50000 units PO Capsule TAKE 1 CAPSULE BY

## 2018-01-21 NOTE — Progress Notes
5. Absent common peroneal F-wave responses bilaterally. The symmetric absence of responses is sometimes a normal finding and female patients.  6. Prolonged right tibial F-wave latency. The left tibial F-wave latency was normal.  7. Prolonged tibial H reflex responses bilaterally.  ?  Needle EMG: EMG in the left leg was normal in the tibialis anterior and gastrocnemius muscles.  ?  Impression: Electrophysiologic study was abnormal with findings that seem to be most consistent with mild lumbosacral radicular dysfunction as evidenced by the prolonged late responses, low amplitude distal motor response but normal sensory conductions. The absence of any EMG abnormality indicates that there is not significant motor axon loss associated. We did not find any evidence here to suggest that there is a large fiber peripheral neuropathy. Her symptoms do not seem to be consistent with a small fiber neuropathy    Eye exam (01/2017)     Glaucoma suspect, bilateral  - (+) family history: mother, mGm, mAunt   - African American   - HVF 24-2 01/2017: possible bilateral nasal defects with rim artifact OS  - IOP has been wnl, continue to observe for now      Sjogren's syndrome with keratoconjunctivitis sicca  - continue follow up with rheumatology  - recommend artificial tears QID OU  - discussed use of preservative free artificial tears if using more frequently   - discussed minimizing environmental factors    ?  Long-term use of Plaquenil  - currently off plaquenil   - no evidence of maculopathy at this time   - patient currently on 400 mg Plaquenil x 4-5 years  - real body weight: 81.6 kg and daily dose 4.9 mg/kg  - Patient's major risk factors for toxicity:                         - >5.0 mg/kg real weight: no                        - >5 years of use: almost, 5 years per history                        - concurrent renal disease: no                        - concurrent tamoxifen use: no

## 2018-01-21 NOTE — Progress Notes
Allergies:  Allergies   Allergen Reactions   ? No Known Drug Allergy      No Known Drug Allergies       PMH:   Past Medical History:   Diagnosis Date   ? Atrial fib/flutter, transient    ? Blood clot in vein    ? CAD (coronary artery disease)    ? 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 COPD (chronic obstructive pulmonary disease) (CMS-HCC code)    ? Hyperlipemia    ? Hypertension    ? Lupus    ? Occasional tremors    ? Osteoarthritis    ? Personal history of noncompliance with medical treatment, presenting hazards to health    ? Pulmonary embolism (CMS-HCC code)    ? Sjogren's syndrome (CMS-HCC code)    ? TIA (transient ischemic attack)        PSH:  Past Surgical History:   Procedure Laterality Date   ? CHOLECYSTECTOMY      C-Section   ? CHOLECYSTECTOMY      Surgery Description: Cholecystectomy;   (Created by Conversion)       Family History:   No Rheumatological family history    Family History   Problem Relation Age of Onset   ? Asthma Mother    ? Heart Disease Mother    ? Hypertension Mother    ? Stroke Mother    ? Vision Loss Mother    ? Asthma Brother    ? Birth Defects Brother    ? Early Death Brother    ? Heart Disease Brother    ? High Cholesterol Brother    ? Hypertension Brother    ? Asthma Daughter    ? High Cholesterol Daughter    ? Hypertension Daughter    ? Asthma Maternal Aunt    ? Heart Disease Maternal Aunt    ? Hypertension Maternal Aunt    ? Miscarriages / Stillbirths Maternal Aunt    ? Stroke Maternal Aunt    ? Vision Loss Maternal Aunt    ? Heart Disease Maternal Uncle    ? Hypertension Maternal Uncle    ? Stroke Maternal Uncle    ? Arthritis Maternal Grandmother    ? Asthma Maternal Grandmother    ? Clotting Disorder Maternal Grandmother    ? Heart Disease Maternal Grandmother    ? Hypertension Maternal Grandmother    ? Miscarriages / Stillbirths Maternal Grandmother    ? Vision Loss Maternal Grandmother        Social History:     Social History

## 2018-01-21 NOTE — Progress Notes
CBC and Differential (All Labs)   Result Value Ref Range    WHITE BLOOD CELL COUNT 3.2 (L) 3.8 - 10.8 Thousand/uL    RBC 5.08 3.80 - 5.10 Million/uL    Hemoglobin 14.0 11.7 - 15.5 g/dL    Hematocrit 43.5 35.0 - 45.0 %    MCV 85.6 80.0 - 100.0 fL    MCH 27.6 27.0 - 33.0 pg    MCHC 32.2 32.0 - 36.0 g/dL    RDW 12.6 11.0 - 15.0 %    Platelets 192 140 - 400 Thousand/uL    MPV 11.3 7.5 - 12.5 fL    Neutrophils Absolute 1,050 (L) 1,500 - 7,800 cells/uL    Lymphocytes Absolute 1,763 850 - 3,900 cells/uL    Monocytes Absolute 189 (L) 200 - 950 cells/uL    Eosinophils Absolute 179 15 - 500 cells/uL    Basophils Absolute 19 0 - 200 cells/uL    Neutrophils 32.8 %    LYMPHOCYTES 55.1 %    MONOCYTES 5.9 %    EOSINOPHILS 5.6 %    BASOPHILS 0.6 %       Results for ANMOL, PASCHEN (MRN [id number]) as of 06/03/2017 08:44   Ref. Range 12/25/2016 09:01   Hep A IgM Latest Ref Range: NON-REACTIVE  NON-REACTIVE   HEPATITIS B CORE ANTIBODY (IGM) Latest Ref Range: NON-REACTIVE  NON-REACTIVE   HEPATITIS B SURFACE ANTIGEN Latest Ref Range: NON-REACTIVE  NON-REACTIVE   HEPATITIS C ANTIBODY Latest Ref Range: NON-REACTIVE  NON-REACTIVE       [facility] records -visit March 14 2017   SLE Dx 2010 , anti SSA, SSB + , borderline anti ds dna, diffuse arthralgia since 2007  FMS  Complex migraine  But in 2010 labs RF 25, SSA 118, SSB 26, Ds DNA 200 -  Ineffecive response to Plaquenil    Rx SSZ 1000mg  bid (started 3/15)  Lack of response to tramadol  Gabapentin 600mg  tid  Pred short taper   MRI lumbar spine -NO sig   One episode of WBC 3.7       Radiology:    05/27/15  [facility]   Electrophysiologic findings:  1. Normal sural sensory conduction studies bilaterally.  2. Absent superficial peroneal sensory responses bilaterally. The symmetric absence responses a normal finding.  3. Reduced amplitude left tibial motor response with a normal distal latency. The right tibial motor conduction was normal.  4. Normal common peroneal motor conductions bilaterally.

## 2018-01-21 NOTE — Progress Notes
-   pre-existing macular disease: no  - Screening                        - Baseline HVF (10-2 white SITA): needs to be performed                        - Baseline SD OCT 12/2016: wnl OU                         - recommend annual screening with repeat testing after 5 years of exposure  - discussed risk of toxicity and possible signs/symptoms with patient  - continue per primary/rheumatology    ASSESSMENT:     Problem List Items Addressed This Visit     Sjogrens syndrome (CMS-HCC code)    Fibromyalgia    Relevant Medications    traMADol (ULTRAM) 50 MG PO Tablet      Other Visit Diagnoses     Primary osteoarthritis of both knees        Relevant Medications    predniSONE (DELTASONE) 5 MG PO Tablet    traMADol (ULTRAM) 50 MG PO Tablet    Long-term use of Plaquenil        Synovitis and tenosynovitis        Relevant Medications    predniSONE (DELTASONE) 5 MG PO Tablet    traMADol (ULTRAM) 50 MG PO Tablet    Knee effusion, right        Relevant Medications    predniSONE (DELTASONE) 5 MG PO Tablet          PLAN:     Counseling was given regarding impression.     Sjogren's /SLE: She has Pos ANA 1:1280 in 2012. Previously treated with PLaquenil  and [facility] at [facility] . Currently on Plaquenil therapy     + SSA , negative SSB, mildly low C4 and low level pos anti Ds DNA .Negative RF and ant CCP.  She is having flare with several joint swelling , increasing DsDNA , leucopenia  Will consider adding  Another DMARD   Will continue Plaquenil , Eye exam 01/2017 ( after stopping Plaquenil for a month because no follow up )   We are Trying to avoid steroid because of weight gain  But with multiple joint swelling , will do quick taper 2 weeks   Follow up in 3 months with prior labs     # Sicca symptoms ;  Pt counseled on the need for regular Dental Appointments every 6 months    # Right trochentric bursitis ; injection given  In past     FMS: Reports poor sleep with trouble falling asleep and staying asleep as

## 2018-01-21 NOTE — Progress Notes
CRANIAL NERVES: Cranial nerves II-XII were normal   No motor /sensory deficit        Lab or Diagnostic Testing:     Results for orders placed or performed in visit on 12/05/17   Comprehensive Metabolic Panel (All Labs)   Result Value Ref Range    Glucose 77 65 - 99 mg/dL    Urea Nitrogen 12 7 - 25 mg/dL    Creatinine 0.82 0.50 - 1.05 mg/dL    EGFR 83 > OR = 60 mL/min/1.15m    Glom Filt Rate, Est African American 96 > OR = 60 mL/min/1.758m   BUN/Creatinine Ratio NOT APPLICABLE 6 - 22 (calc)    Sodium 136 135 - 146 mmol/L    Potassium 4.3 3.5 - 5.3 mmol/L    Chloride 99 98 - 110 mmol/L    CARBON DIOXIDE 25 20 - 32 mmol/L    Calcium 9.9 8.6 - 10.4 mg/dL    Protein, Total 8.2 (H) 6.1 - 8.1 g/dL    ALBUMIN 4.3 3.6 - 5.1 g/dL    Globulin 3.9 (H) 1.9 - 3.7 g/dL (calc)    ALBUMIN/GLOBULIN RATIO 1.1 1.0 - 2.5 (calc)    Total Bilirubin 0.5 0.2 - 1.2 mg/dL    Alkaline Phosphatase 67 33 - 130 U/L    AST 22 10 - 35 U/L    ALT 14 6 - 29 U/L   C3 Complement (All Labs)   Result Value Ref Range    C3 Complement 130 83 - 193 mg/dL   C4 Complement (All Labs)   Result Value Ref Range    C4 Complement 27 15 - 57 mg/dL   Anti DNA, Double Stranded (All Labs)   Result Value Ref Range    dsDNA Ab 24 (H) IU/mL   Sedimentation Rate ESR (All Labs)   Result Value Ref Range    SED RATE BY MODIFIED WESTERGREN 29 < OR = 30 mm/h   Urinalysis W Microscopy ([facility])   Result Value Ref Range    Color -Ur YELLOW YELLOW    APPEARANCE CLEAR CLEAR    Specific Gravity -Ur 1.016 1.001 - 1.035    pH -Ur 6.5 5.0 - 8.0    Glucose -Ur NEGATIVE NEGATIVE    Bilirubin -Ur NEGATIVE NEGATIVE    Ketones UA NEGATIVE NEGATIVE    Hb -Ur NEGATIVE NEGATIVE    Protein-UA NEGATIVE NEGATIVE    NITRITE NEGATIVE NEGATIVE    Leukocyte Esterase -Ur NEGATIVE NEGATIVE    WBC -Ur NONE SEEN < OR = 5 /HPF    RBC -Ur NONE SEEN < OR = 2 /HPF    Squam Epithel, UA 0-5 < OR = 5 /HPF    BACTERIA NONE SEEN NONE SEEN /HPF    Hyaline Casts, UA NONE SEEN NONE SEEN /LPF

## 2018-01-21 NOTE — Progress Notes
MOUTH 1 TIME A WEEK ON AN EMPTY STOMACH 01/15/17  Yes Gretchen Short, MD   etodolac (LODINE) 400 MG PO Tablet Take 1 tablet by mouth 2 times daily. prn 06/03/17  Yes Thway, Myint Myat, MD   gabapentin (NEURONTIN) 600 MG PO Tablet TAKE 1 TABLET BY MOUTH THREE TIMES DAILY 01/18/18  Yes Simonne Maffucci, PA-C   hydroCHLOROthiazide (HYDRODIURIL) 25 MG PO Tablet TAKE 1 TABLET BY MOUTH DAILY 01/15/17  Yes Gretchen Short, MD   hydroxychloroquine (PLAQUENIL) 200 MG PO Tablet Take 1 tablet by mouth 2 times daily. 08/25/17  Yes Thway, Myint Myat, MD   magnesium oxide (MAG-OX) 400 (241.3 Mg) MG PO Tablet Take 400 mg by mouth daily.   Yes Information, Historical   ondansetron (ZOFRAN-ODT) 4 MG PO Tablet Disintegrating Dissolve 2 tablets in mouth every 8 hours as needed. 09/16/17  Yes Marthe Patch, APRN   pilocarpine (SALAGEN) 5 MG PO Tablet Take 1 tablet by mouth 2 times daily. 10/13/17  Yes Thway, Myint Myat, MD   sertraline (ZOLOFT) 50 MG PO Tablet TAKE 1 TABLET BY MOUTH EVERY DAY 09/04/17  Yes Simonne Maffucci, PA-C   solifenacin (VESICARE) 10 MG PO Tablet Take 1 tablet by mouth daily. 07/11/17  Yes Gretchen Short, MD   traMADol (ULTRAM) 50 MG PO Tablet Take 1 tablet by mouth every 8 hours as needed for pain. 01/21/18  Yes Thway, Myint Myat, MD   traMADol (ULTRAM) 50 MG PO Tablet Take 1 tablet by mouth every 8 hours as needed for pain. 04/03/17 01/21/18 Yes Thway, Myint Vic Blackbird, MD   omeprazole (PriLOSEC OTC) 20 MG PO Tablet Delayed Release Take 1 tablet by mouth 2 times daily for 30 days. 10/15/17 11/14/17  Thway, Arlyss Gandy, MD   prednisoLONE acetate (PRED FORTE) 1 % OP Suspension Place 1 drop into both eyes 4 times daily for 14 days. 01/14/17 01/28/17  Verdene Lennert, MD   predniSONE (DELTASONE) 5 MG PO Tablet 30mg  daily for 3 days then cut down5mg  every 3 days 01/21/18   Norval Gable, MD   traZODone (DESYREL) 50 MG PO Tablet Take 1 tablet by mouth nightly at bedtime for 30 doses. 04/09/17 05/09/17  Orlean Patten, MD

## 2018-01-21 NOTE — Progress Notes
Social History   ? Marital status: Single     Spouse name: N/A   ? Number of children: N/A   ? Years of education: N/A     Social History Main Topics   ? Smoking status: Never Smoker   ? Smokeless tobacco: Never Used   ? Alcohol use No   ? Drug use: No   ? Sexual activity: Not Currently     Other Topics Concern   ? Not on file     Social History Narrative   ? No narrative on file       Physical Exam:   BP 135/88  - Pulse 78  - Temp 37.1 ?C (98.7 ?F)  - Resp 16  - Ht 1.702 m (5\' 7" )  - Wt 97.1 kg (214 lb)  - BMI 33.52 kg/m?       General appearance  alert, cooperative, no distress, appears stated age, obese    Psychiatric/Behavioral:  Negative for behavioral problems, confusion and agitation.          Head  Normocephalic, without obvious abnormality, no scalp lesion  normal hair distribution   Eyes  conjunctivae/corneas clear. PERTL, EOMI    HEENT Lips, mucosa very  dry  .no oral ulceration   Neck supple, symmetrical, trachea midline, no adenopathy,thyroid: not enlarged,  no JVD   Back   Normal posture, alignment  +  tenderness to palpation of spinous processes.? muscle spasm in neck and trapezius   ?   Lungs   Not tachypnea . Normal respiratory effort  clear to auscultation bilaterally       Heart  regular rate and rhythm, S1, S2 normal, no murmur, click, rub or gallop   Abdomen   soft, non-tender. Bowel sounds normal. No masses,  No organomegaly   MSK She has tender swelling in left 2nd DIP , 3d DIP , few fingers dacylitis   Right knee swelling , warm and tender over medial colletral and lateral colletral ligament   FMS tender point 16/18*     Extremities extremities normal, atraumatic, no cyanosis or edema   Pulses 2+ and symmetric   Skin Skin color, texture, turgor normal. No rashes or lesions   Lymph nodes Cervical, supraclavicular, and axillary nodes normal.   Neurologic MENTAL STATUS: Alert. Oriented to person, place, and time. Intact recent and remote memory. No aphasia.

## 2018-01-21 NOTE — Progress Notes
complaints during that time or any surgical procedure. Per pt she still has not see the Ophthalmologist and ran out of her plaquenil about 1 week ago. She also has complaints of continued Sicca symptoms as well as sharp shooting pains in her upper back and shoulders for the past day.    01/29/17   C/o fatigue and pain all over . We stopped Plaquenil last visit because no eye exam . Now she was seen  Recently and no maculopathy .  Her treatment for fibromyalgia is adjused in last visit .     04/03/17  RTC for SLE ,   Pred 5mg  daily for 1 month done and had labs after  Which seems better . No leucopenia, complements normal . Lower DsDNA . C/o pain and weight gain . Pain especially in knees and right lateral hip.  Other ROS negative except fatigue weight gain and joint pain .    10/13/17  RTC 4  months . SLE/Sjogren's currently on Plaquenil bid .  She c/o knee pain and right later hip and back  Had right throchenteric injection  4/18 . Pain was better for few weeka only  . Bilat knees making sound as well as so painful .Had Xrays last visit , showing mild OA . No inflammation . No fever , rash   Labs reviewed Ds DNA  Even better  . Normal complement . Total WBC is slightly low but normal Lymphocyte .     01/21/18  RTC 4  months . SLE/Sjogren's currently on Plaquenil bid .  Bilat knees pain , full and tight . Difficult to sit / stand position change  Started 1  Week , getting worse . She gained 20 lbs from last visit as well . No new rash , fever , chest pain , cough .  She also has pain in her hand and swelling  Labs showed mildly elevated DsDNA and leucopenia     The pain is At   Knees ,  Can't sleep sometimes  no swelling Morning stiffness and tender + . Right hip , ( lateral thigh ) can't walk well , can't sleep on that side   Patient describes the level of pain as  7 of 10.   The pain was first noticed    6 Years     It usually lasts  * always there.   When is the problem worst?  No specific time

## 2018-01-21 NOTE — Progress Notes
well as daytime fatigue.  Pt counseled on need for better sleep hygiene including setting a scheduled bedtime, no day time napping, appropriate bedroom environment as well scheduled exercise program ( a least 30 mins of activity daily)  Currently on gabapentin and Zoloft, flexeril,  tramadol prn ( rarely take it)   Refilled tramadol , eforce checked     Myint Loni Beckwith ,MD  Assistant  Professor of Medicine,   Division of Rheumatology,  [employer] [facility] [place], [place].

## 2018-01-21 NOTE — Progress Notes
Department of Rheumatology    Patient: Nichole Colon  MRN: [id number]  DOB: 1966/02/18      Chief Complaint:  Follow up: Lab Results    Patient Active Problem List   Diagnosis   ? Plantar fascial fibromatosis   ? Systemic lupus erythematosus (CMS-HCC code)   ? Osteoarthrosis involving lower leg   ? Other disorder of menstruation and other abnormal bleeding from female genital tract   ? Unspecified transient cerebral ischemia   ? Other chronic pain   ? Other malaise and fatigue   ? Allergy, unspecified not elsewhere classified   ? Chronic airway obstruction, not elsewhere classified   ? Essential hypertension   ? Gonococcal infection (acute) of lower genitourinary tract   ? Other and unspecified hyperlipidemia   ? Sjogrens syndrome (CMS-HCC code)   ? Fibromyalgia   ? Migraine without aura and without status migrainosus, not intractable   ? Chronic bilateral low back pain without sciatica   ? OAB (overactive bladder)   ? Body mass index 28.0-28.9, adult   ? Chest pain   ? Esophageal dysphagia       History of Present Illness:     Nichole Colon is an 52 y.o. AA female who is here for follow up  SLE/ Sjogren's  Disease and fibromyalgia .    She saw  Dr Barrie Folk for several years and established Dx of lupus/Sjogren's, Fibromyalgia and OA around 2010  .   Her last visit  With him was 03/14/14 until insurance changed   She was treated with Plaquenil then switched to SSZ . She was on SSZ 2 months and stopped because of nausea . Plaqunil  also gave her dry skin and it was stopped .  She was not on any med in her first visit here 03/06/15  except for FMS.  Neurontin, baclofen and mobic prn   Per [facility] note that time ,  SLE was Dx  (2010) anti SSA, SSB  + , borderline Ds DNA , negative APL study   She was restarted PLq and salagen (03/30/2015)  .    12/24/16   She is here for follow up. Last seen March 2017. Reports that she was hospitalized 2 times since last being seen for her IBS-. Denies any other

## 2018-02-17 ENCOUNTER — Ambulatory Visit: Attending: Internal Medicine | Primary: Internal Medicine

## 2018-02-17 DIAGNOSIS — M329 Systemic lupus erythematosus, unspecified: Secondary | ICD-10-CM

## 2018-02-17 DIAGNOSIS — Z9119 Patient's noncompliance with other medical treatment and regimen: Secondary | ICD-10-CM

## 2018-02-17 DIAGNOSIS — I2699 Other pulmonary embolism without acute cor pulmonale: Secondary | ICD-10-CM

## 2018-02-17 DIAGNOSIS — I6350 Cerebral infarction due to unspecified occlusion or stenosis of unspecified cerebral artery: Secondary | ICD-10-CM

## 2018-02-17 DIAGNOSIS — G459 Transient cerebral ischemic attack, unspecified: Secondary | ICD-10-CM

## 2018-02-17 DIAGNOSIS — M659 Synovitis and tenosynovitis, unspecified: Principal | ICD-10-CM

## 2018-02-17 DIAGNOSIS — M3500 Sicca syndrome, unspecified: Secondary | ICD-10-CM

## 2018-02-17 DIAGNOSIS — I8290 Acute embolism and thrombosis of unspecified vein: Secondary | ICD-10-CM

## 2018-02-17 DIAGNOSIS — M25461 Effusion, right knee: Secondary | ICD-10-CM

## 2018-02-17 DIAGNOSIS — M25462 Effusion, left knee: Secondary | ICD-10-CM

## 2018-02-17 DIAGNOSIS — I10 Essential (primary) hypertension: Principal | ICD-10-CM

## 2018-02-17 DIAGNOSIS — M8700 Idiopathic aseptic necrosis of unspecified bone: Secondary | ICD-10-CM

## 2018-02-17 DIAGNOSIS — IMO0002 Atrial fib/flutter, transient: Secondary | ICD-10-CM

## 2018-02-17 DIAGNOSIS — I2510 Atherosclerotic heart disease of native coronary artery without angina pectoris: Secondary | ICD-10-CM

## 2018-02-17 DIAGNOSIS — M25562 Pain in left knee: Secondary | ICD-10-CM

## 2018-02-17 DIAGNOSIS — M25561 Pain in right knee: Secondary | ICD-10-CM

## 2018-02-17 DIAGNOSIS — M1990 Unspecified osteoarthritis, unspecified site: Secondary | ICD-10-CM

## 2018-02-17 DIAGNOSIS — E785 Hyperlipidemia, unspecified: Secondary | ICD-10-CM

## 2018-02-17 DIAGNOSIS — J449 Chronic obstructive pulmonary disease, unspecified: Secondary | ICD-10-CM

## 2018-02-17 DIAGNOSIS — R251 Tremor, unspecified: Secondary | ICD-10-CM

## 2018-02-17 MED ORDER — OXYCODONE-ACETAMINOPHEN 5-325 MG PO TABS
1 | ORAL | 0 refills | Status: CP | PRN
Start: 2018-02-17 — End: 2018-08-06

## 2018-03-12 ENCOUNTER — Encounter: Attending: Acute Care | Primary: Internal Medicine

## 2018-03-13 DIAGNOSIS — R609 Edema, unspecified: Principal | ICD-10-CM

## 2018-03-13 MED ORDER — HYDROCHLOROTHIAZIDE 25 MG PO TABS
0 refills | Status: CP
Start: 2018-03-13 — End: 2018-06-17

## 2018-03-13 MED ORDER — HYDROCHLOROTHIAZIDE 25 MG PO TABS
3 refills | Status: CP
Start: 2018-03-13 — End: 2018-06-17

## 2018-03-17 ENCOUNTER — Encounter: Attending: Internal Medicine | Primary: Internal Medicine

## 2018-04-28 DIAGNOSIS — G894 Chronic pain syndrome: Principal | ICD-10-CM

## 2018-04-28 MED ORDER — GABAPENTIN 600 MG PO TABS
0 refills | Status: CP
Start: 2018-04-28 — End: 2018-06-17

## 2018-05-13 ENCOUNTER — Emergency Department (HOSPITAL_BASED_OUTPATIENT_CLINIC_OR_DEPARTMENT_OTHER): Payer: Medicare (Managed Care)

## 2018-05-13 ENCOUNTER — Other Ambulatory Visit: Payer: Self-pay

## 2018-05-13 ENCOUNTER — Emergency Department (HOSPITAL_BASED_OUTPATIENT_CLINIC_OR_DEPARTMENT_OTHER)
Admission: EM | Admit: 2018-05-13 | Discharge: 2018-05-13 | Disposition: A | Discharge status: Home / Self Care | Payer: Medicare (Managed Care) | Attending: Emergency Medicine | Admitting: Emergency Medicine

## 2018-05-13 ENCOUNTER — Encounter (HOSPITAL_BASED_OUTPATIENT_CLINIC_OR_DEPARTMENT_OTHER): Payer: Self-pay

## 2018-05-13 DIAGNOSIS — Z9049 Acquired absence of other specified parts of digestive tract: Secondary | ICD-10-CM | POA: Insufficient documentation

## 2018-05-13 DIAGNOSIS — N939 Abnormal uterine and vaginal bleeding, unspecified: Secondary | ICD-10-CM | POA: Diagnosis present

## 2018-05-13 DIAGNOSIS — N950 Postmenopausal bleeding: Secondary | ICD-10-CM | POA: Diagnosis not present

## 2018-05-13 HISTORY — DX: Reserved for concepts with insufficient information to code with codable children: IMO0002

## 2018-05-13 HISTORY — DX: Unspecified osteoarthritis, unspecified site: M19.90

## 2018-05-13 HISTORY — DX: Other pulmonary embolism without acute cor pulmonale: I26.99

## 2018-05-13 HISTORY — DX: Systemic lupus erythematosus, unspecified: M32.9

## 2018-05-13 HISTORY — DX: Fibromyalgia: M79.7

## 2018-05-13 HISTORY — DX: Sjogren syndrome, unspecified: M35.00

## 2018-05-13 HISTORY — DX: Angina pectoris with documented spasm: I20.1

## 2018-05-13 LAB — CBC WITH DIFFERENTIAL/PLATELET
BASOS ABS: 0 10*3/uL (ref 0.0–0.1)
Basophils Relative: 0 %
EOS PCT: 6 %
Eosinophils Absolute: 0.3 10*3/uL (ref 0.0–0.7)
HCT: 39.3 % (ref 36.0–46.0)
HEMOGLOBIN: 13.1 g/dL (ref 12.0–15.0)
LYMPHS ABS: 2.3 10*3/uL (ref 0.7–4.0)
Lymphocytes Relative: 58 %
MCH: 28.2 pg (ref 26.0–34.0)
MCHC: 33.3 g/dL (ref 30.0–36.0)
MCV: 84.5 fL (ref 78.0–100.0)
Monocytes Absolute: 0.3 10*3/uL (ref 0.1–1.0)
Monocytes Relative: 7 %
NEUTROS PCT: 29 %
Neutro Abs: 1.1 10*3/uL — ABNORMAL LOW (ref 1.7–7.7)
PLATELETS: 244 10*3/uL (ref 150–400)
RBC: 4.65 MIL/uL (ref 3.87–5.11)
RDW: 13.7 % (ref 11.5–15.5)
WBC: 3.9 10*3/uL — AB (ref 4.0–10.5)

## 2018-05-13 LAB — URINALYSIS, ROUTINE W REFLEX MICROSCOPIC

## 2018-05-13 LAB — WET PREP, GENITAL
CLUE CELLS WET PREP: NONE SEEN
SPERM: NONE SEEN
Trich, Wet Prep: NONE SEEN
YEAST WET PREP: NONE SEEN

## 2018-05-13 LAB — URINALYSIS, MICROSCOPIC (REFLEX)

## 2018-05-13 LAB — PREGNANCY, URINE: Preg Test, Ur: NEGATIVE

## 2018-05-13 LAB — OCCULT BLOOD X 1 CARD TO LAB, STOOL: Fecal Occult Bld: POSITIVE — AB

## 2018-05-13 NOTE — ED Notes (Signed)
ED Provider at bedside. 

## 2018-05-13 NOTE — ED Notes (Signed)
Pt verbalizes understanding of d/c instructions and denies any further needs at this time. 

## 2018-05-13 NOTE — Discharge Instructions (Addendum)
It was so nice to meet you.   You came to the emergency department with vaginal bleeding. We ordered a pelvic ultrasound, which showed that the lining of your uterus (endometrium) is thicker than it should be. You need to have an endometrial biopsy done to make sure that you don't have uterine cancer.  Here are some phone numbers for OBGYN offices in the [facility] area: [facility]: [place]: [place] [facility] [place]: [phone]  You also had some blood in your stool, so you will need to follow-up with a GI doctor. [facility]: [phone] [facility]: [phone]  If you start having a lot of vaginal bleeding or if you start having chest pain, shortness of breath, or severe dizziness, please come back to the emergency department.

## 2018-05-13 NOTE — ED Triage Notes (Signed)
Pt states that she has not had a period in 2 years-vaginal bleeding x 2 days-migraine and dizziness x 6 days-NAD-steady gait
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
## 2018-05-14 LAB — GC/CHLAMYDIA PROBE AMP (~~LOC~~) NOT AT ARMC
CHLAMYDIA, DNA PROBE: NEGATIVE
NEISSERIA GONORRHEA: NEGATIVE

## 2018-05-20 ENCOUNTER — Ambulatory Visit: Payer: Medicare (Managed Care) | Admitting: Obstetrics and Gynecology

## 2018-05-25 ENCOUNTER — Inpatient Hospital Stay (HOSPITAL_COMMUNITY)
Admission: AD | Admit: 2018-05-25 | Discharge: 2018-05-25 | Disposition: A | Discharge status: Home / Self Care | Payer: Medicare (Managed Care) | Source: Ambulatory Visit | Attending: Obstetrics & Gynecology | Admitting: Obstetrics & Gynecology

## 2018-05-25 ENCOUNTER — Encounter (HOSPITAL_COMMUNITY): Payer: Self-pay

## 2018-05-25 DIAGNOSIS — G43009 Migraine without aura, not intractable, without status migrainosus: Secondary | ICD-10-CM | POA: Diagnosis not present

## 2018-05-25 DIAGNOSIS — Z86711 Personal history of pulmonary embolism: Secondary | ICD-10-CM | POA: Insufficient documentation

## 2018-05-25 DIAGNOSIS — R109 Unspecified abdominal pain: Secondary | ICD-10-CM

## 2018-05-25 DIAGNOSIS — D259 Leiomyoma of uterus, unspecified: Secondary | ICD-10-CM

## 2018-05-25 LAB — URINALYSIS, ROUTINE W REFLEX MICROSCOPIC
Bilirubin Urine: NEGATIVE
GLUCOSE, UA: NEGATIVE mg/dL
Hgb urine dipstick: NEGATIVE
Ketones, ur: NEGATIVE mg/dL
LEUKOCYTES UA: NEGATIVE
NITRITE: NEGATIVE
PROTEIN: NEGATIVE mg/dL
Specific Gravity, Urine: 1.004 — ABNORMAL LOW (ref 1.005–1.030)
pH: 6 (ref 5.0–8.0)

## 2018-05-25 LAB — CBC
HCT: 37.1 % (ref 36.0–46.0)
HEMOGLOBIN: 11.8 g/dL — AB (ref 12.0–15.0)
MCH: 27.4 pg (ref 26.0–34.0)
MCHC: 31.8 g/dL (ref 30.0–36.0)
MCV: 86.3 fL (ref 78.0–100.0)
PLATELETS: 221 10*3/uL (ref 150–400)
RBC: 4.3 MIL/uL (ref 3.87–5.11)
RDW: 14.3 % (ref 11.5–15.5)
WBC: 3.9 10*3/uL — AB (ref 4.0–10.5)

## 2018-05-25 LAB — POCT PREGNANCY, URINE: PREG TEST UR: NEGATIVE

## 2018-05-25 MED ORDER — ONDANSETRON 8 MG PO TBDP
8.0000 mg | ORAL_TABLET | Freq: Once | ORAL | Status: AC
Start: 2018-05-25 — End: 2018-05-25
  Administered 2018-05-25: 8 mg via ORAL
  Filled 2018-05-25: qty 1

## 2018-05-25 MED ORDER — ACETAMINOPHEN 500 MG PO TABS
1000.0000 mg | ORAL_TABLET | Freq: Once | ORAL | Status: AC
  Administered 2018-05-25: 1000 mg via ORAL
  Filled 2018-05-25: qty 2

## 2018-05-25 NOTE — Discharge Instructions (Signed)
Abnormal Uterine Bleeding Abnormal uterine bleeding can affect women at various stages in life, including teenagers, women in their reproductive years, pregnant women, and women who have reached menopause. Several kinds of uterine bleeding are considered abnormal, including:  Bleeding or spotting between periods.  Bleeding after sexual intercourse.  Bleeding that is heavier or more than normal.  Periods that last longer than usual.  Bleeding after menopause. Many cases of abnormal uterine bleeding are minor and simple to treat, while others are more serious. Any type of abnormal bleeding should be evaluated by your health care provider. Treatment will depend on the cause of the bleeding. Follow these instructions at home: Monitor your condition for any changes. The following actions may help to alleviate any discomfort you are experiencing:  Avoid the use of tampons and douches as directed by your health care provider.  Change your pads frequently. You should get regular pelvic exams and Pap tests. Keep all follow-up appointments for diagnostic tests as directed by your health care provider. Contact a health care provider if:  Your bleeding lasts more than 1 week.  You feel dizzy at times. Get help right away if:  You pass out.  You are changing pads every 15 to 30 minutes.  You have abdominal pain.  You have a fever.  You become sweaty or weak.  You are passing large blood clots from the vagina.  You start to feel nauseous and vomit. This information is not intended to replace advice given to you by your health care provider. Make sure you discuss any questions you have with your health care provider. Document Released: 12/02/2005 Document Revised: 05/15/2016 Document Reviewed: 07/01/2013 Elsevier Interactive Patient Education  2017 Elsevier Inc.  

## 2018-05-25 NOTE — MAU Note (Signed)
Was seen in [facility] ED last week for postmenopausal bleeding.  Was told she needed to have a endometrial biopsy to assess for cancer.  States she was supposed to receive a referral to the clinic-hasn't received a call.  Vaginal bleeding has stopped, however she has noticed increased blood in her stool.  Abdominal/back pain started getting worse over the past few days.  Receives Aimovig shots for her migraines.  Took a gabapentin, cardiazem and zoloft before laying down tonight.

## 2018-05-25 NOTE — MAU Provider Note (Signed)
History     CSN: [id number]  Arrival date and time: 05/25/18 0146   First Provider Initiated Contact with Patient 05/25/18 0242      Chief Complaint  Patient presents with  . Abdominal Pain  . Back Pain   HPI  Michelle Nash is a 52 y.o. non pregnant female who presents with abdominal pain and headache. Is visiting from [place] to take care of her ailing mother. Was seen at [facility] on 5/29 for postmenopausal bleeding. Has been in menopause for 2 years. During that visit patient was diagnosed with rectal bleeding & postmenopausal bleeding; she was referred to GI & gyn. Had appt scheduled with gyn on 6/5, but had to cancel it for insurance reasons b/c the referral from her PCP had not gone through yet.  Was told if her symptoms got worse to come to [facility] for evaluation.  Vaginal bleeding has resolved. Reports abdominal pain has continued. She also has known uterine fibroids. Reports lower abdominal pain. Describes as sharp cramp like pain that is worse in RLQ and right groin. Pain is intermittent and rates 6/10. Has not treated pain. Nothing makes pain better or worse. Endorses nausea. Denies vomiting, diarrhea, constipation, vaginal discharge, or dysuria. Reports continued bloody stool that is mucoid in nature. Bloody stool for several months now. Is waiting to hear from GI regarding referral.  Has history of migraines. Reports frontal headache that is worse on right side. Rates pain 8/10. Endorses photophobia. Denies visual disturbance, numbness/tingling, chest pain, or SOB. Takes weekly Aimovig shots for her migraines. Otherwise no tx for headache tonight.    Past Medical History:  Diagnosis Date  . Coronary artery spasm ([place])   . Fibromyalgia   . Lupus ([place])   . Osteoarthritis   . Pulmonary emboli ([place])   . Sjogren's disease [facility])     Past Surgical History:  Procedure Laterality Date  . CESAREAN SECTION    . CHOLECYSTECTOMY      No family history on file.  Social History    Tobacco Use  . Smoking status: Never Smoker  . Smokeless tobacco: Never Used  Substance Use Topics  . Alcohol use: Never    Frequency: Never  . Drug use: Never    Allergies: No Known Allergies  Medications Prior to Admission  Medication Sig Dispense Refill Last Dose  . diltiazem (CARDIZEM) 90 MG tablet Take 90 mg by mouth 4 (four) times daily.   05/25/2018 at Unknown time  . gabapentin (NEURONTIN) 600 MG tablet Take 600 mg by mouth 3 (three) times daily.   05/25/2018 at 2200  . sertraline (ZOLOFT) 100 MG tablet Take 100 mg by mouth daily.   05/25/2018 at Unknown time    Review of Systems  Constitutional: Negative.   Eyes: Positive for photophobia. Negative for visual disturbance.  Respiratory: Negative for shortness of breath.   Cardiovascular: Negative for chest pain.  Gastrointestinal: Positive for abdominal pain, blood in stool and nausea. Negative for abdominal distention, constipation, diarrhea, rectal pain and vomiting.  Genitourinary: Negative.   Neurological: Positive for dizziness and headaches. Negative for facial asymmetry, speech difficulty, weakness and numbness.   Physical Exam   Blood pressure 137/89, pulse 65, temperature 98.6 F (37 C), resp. rate 19, height 5\' 7"  (1.702 m), weight 215 lb 4 oz (97.6 kg), SpO2 100 %.  Physical Exam  Nursing note and vitals reviewed. Constitutional: She is oriented to person, place, and time. She appears well-developed and well-nourished. No distress.  HENT:  Head: Normocephalic  and atraumatic.  Eyes: Conjunctivae are normal. Right eye exhibits no discharge. Left eye exhibits no discharge. No scleral icterus.  Neck: Normal range of motion.  Cardiovascular: Normal rate, regular rhythm and normal heart sounds.  No murmur heard. Respiratory: Effort normal and breath sounds normal. No respiratory distress. She has no wheezes.  GI: Soft. Bowel sounds are normal. She exhibits no distension. There is no tenderness. There is no  rebound and no guarding.  Neurological: She is alert and oriented to person, place, and time. No cranial nerve deficit.  Skin: Skin is warm and dry. She is not diaphoretic.  Psychiatric: She has a normal mood and affect. Her behavior is normal. Judgment and thought content normal.    MAU Course  Procedures Results for orders placed or performed during the hospital encounter of 05/25/18 (from the past 24 hour(s))  Urinalysis, Routine w reflex microscopic     Status: Abnormal   Collection Time: 05/25/18  1:51 AM  Result Value Ref Range   Color, Urine STRAW (A) YELLOW   APPearance CLEAR CLEAR   Specific Gravity, Urine 1.004 (L) 1.005 - 1.030   pH 6.0 5.0 - 8.0   Glucose, UA NEGATIVE NEGATIVE mg/dL   Hgb urine dipstick NEGATIVE NEGATIVE   Bilirubin Urine NEGATIVE NEGATIVE   Ketones, ur NEGATIVE NEGATIVE mg/dL   Protein, ur NEGATIVE NEGATIVE mg/dL   Nitrite NEGATIVE NEGATIVE   Leukocytes, UA NEGATIVE NEGATIVE  Pregnancy, urine POC     Status: None   Collection Time: 05/25/18  2:32 AM  Result Value Ref Range   Preg Test, Ur NEGATIVE NEGATIVE  CBC     Status: Abnormal   Collection Time: 05/25/18  3:09 AM  Result Value Ref Range   WBC 3.9 (L) 4.0 - 10.5 K/uL   RBC 4.30 3.87 - 5.11 MIL/uL   Hemoglobin 11.8 (L) 12.0 - 15.0 g/dL   HCT 37.1 36.0 - 46.0 %   MCV 86.3 78.0 - 100.0 fL   MCH 27.4 26.0 - 34.0 pg   MCHC 31.8 30.0 - 36.0 g/dL   RDW 14.3 11.5 - 15.5 %   Platelets 221 150 - 400 K/uL    MDM VSS, NAD Pt drove self here. Tylenol & zofran given.  CBC -- hemoglobin stable. No leukocytosis.  Pt stable for discharge. Stressed importance of appropriate f/u with specialties. Pt states she will f/u with her PCP & insurance company tomorrow to insure that the referrals went through so she can schedule appts  Assessment and Plan  A: 1. Abdominal pain in female   2. Uterine leiomyoma, unspecified location   3. Migraine without aura and without status migrainosus, not intractable     P: Discharge home Schedule f/u with gyn Schedule f/u with GI Discussed reasons to return to MAU vs ED   Jorje Guild 05/25/2018, 2:42 AM

## 2018-06-17 ENCOUNTER — Ambulatory Visit: Attending: Internal Medicine | Primary: Internal Medicine

## 2018-06-17 DIAGNOSIS — M170 Bilateral primary osteoarthritis of knee: Secondary | ICD-10-CM

## 2018-06-17 DIAGNOSIS — F323 Major depressive disorder, single episode, severe with psychotic features: Secondary | ICD-10-CM

## 2018-06-17 DIAGNOSIS — M797 Fibromyalgia: Secondary | ICD-10-CM

## 2018-06-17 DIAGNOSIS — M1710 Unilateral primary osteoarthritis, unspecified knee: Secondary | ICD-10-CM

## 2018-06-17 DIAGNOSIS — K219 Gastro-esophageal reflux disease without esophagitis: Secondary | ICD-10-CM

## 2018-06-17 DIAGNOSIS — E559 Vitamin D deficiency, unspecified: Secondary | ICD-10-CM

## 2018-06-17 DIAGNOSIS — R609 Edema, unspecified: Secondary | ICD-10-CM

## 2018-06-17 DIAGNOSIS — N950 Postmenopausal bleeding: Secondary | ICD-10-CM

## 2018-06-17 DIAGNOSIS — F5104 Psychophysiologic insomnia: Secondary | ICD-10-CM

## 2018-06-17 DIAGNOSIS — I10 Essential (primary) hypertension: Secondary | ICD-10-CM

## 2018-06-17 DIAGNOSIS — G894 Chronic pain syndrome: Secondary | ICD-10-CM

## 2018-06-17 MED ORDER — TRAMADOL HCL 50 MG PO TABS
50 mg | Freq: Three times a day (TID) | ORAL | 0 refills | Status: SS | PRN
Start: 2018-06-17 — End: 2018-08-15

## 2018-06-17 MED ORDER — DICLOFENAC SODIUM 1 % TD GEL
4 g | Freq: Four times a day (QID) | TOPICAL | 2 refills | Status: CP
Start: 2018-06-17 — End: ?

## 2018-06-17 MED ORDER — MAGNESIUM OXIDE 400 (241.3 MG) MG PO TABS
400 mg | Freq: Every day | ORAL | Status: CN
Start: 2018-06-17 — End: ?

## 2018-06-17 MED ORDER — TRAZODONE HCL 50 MG PO TABS
50 mg | Freq: Every evening | ORAL | 2 refills | Status: CP
Start: 2018-06-17 — End: 2018-10-15

## 2018-06-17 MED ORDER — GABAPENTIN 600 MG PO TABS
600 mg | Freq: Three times a day (TID) | ORAL | 0 refills | Status: CP
Start: 2018-06-17 — End: ?

## 2018-06-17 MED ORDER — SERTRALINE HCL 50 MG PO TABS
3 refills | Status: CP
Start: 2018-06-17 — End: ?

## 2018-06-17 MED ORDER — DILTIAZEM HCL ER 180 MG PO CP24
3 refills | Status: CP
Start: 2018-06-17 — End: ?

## 2018-06-17 MED ORDER — OMEPRAZOLE MAGNESIUM 20 MG PO TBEC
20 mg | Freq: Two times a day (BID) | ORAL | 1 refills | Status: SS
Start: 2018-06-17 — End: 2018-08-15

## 2018-06-17 MED ORDER — HYDROCHLOROTHIAZIDE 25 MG PO TABS
3 refills | Status: CP
Start: 2018-06-17 — End: ?

## 2018-06-17 MED ORDER — ERGOCALCIFEROL 1.25 MG (50000 UT) PO CAPS
0 refills | Status: CP
Start: 2018-06-17 — End: ?

## 2018-06-21 DIAGNOSIS — I10 Essential (primary) hypertension: Principal | ICD-10-CM

## 2018-06-21 DIAGNOSIS — J449 Chronic obstructive pulmonary disease, unspecified: Secondary | ICD-10-CM

## 2018-06-21 DIAGNOSIS — I2699 Other pulmonary embolism without acute cor pulmonale: Secondary | ICD-10-CM

## 2018-06-21 DIAGNOSIS — M3500 Sicca syndrome, unspecified: Secondary | ICD-10-CM

## 2018-06-21 DIAGNOSIS — M1990 Unspecified osteoarthritis, unspecified site: Secondary | ICD-10-CM

## 2018-06-21 DIAGNOSIS — I6350 Cerebral infarction due to unspecified occlusion or stenosis of unspecified cerebral artery: Secondary | ICD-10-CM

## 2018-06-21 DIAGNOSIS — IMO0002 Atrial fib/flutter, transient: Secondary | ICD-10-CM

## 2018-06-21 DIAGNOSIS — M329 Systemic lupus erythematosus, unspecified: Secondary | ICD-10-CM

## 2018-06-21 DIAGNOSIS — Z9119 Patient's noncompliance with other medical treatment and regimen: Secondary | ICD-10-CM

## 2018-06-21 DIAGNOSIS — R251 Tremor, unspecified: Secondary | ICD-10-CM

## 2018-06-21 DIAGNOSIS — I2510 Atherosclerotic heart disease of native coronary artery without angina pectoris: Secondary | ICD-10-CM

## 2018-06-21 DIAGNOSIS — G459 Transient cerebral ischemic attack, unspecified: Secondary | ICD-10-CM

## 2018-06-21 DIAGNOSIS — I8290 Acute embolism and thrombosis of unspecified vein: Secondary | ICD-10-CM

## 2018-06-21 DIAGNOSIS — E785 Hyperlipidemia, unspecified: Secondary | ICD-10-CM

## 2018-06-26 DIAGNOSIS — R609 Edema, unspecified: Principal | ICD-10-CM

## 2018-06-28 ENCOUNTER — Emergency Department: Admit: 2018-06-28 | Discharge: 2018-06-29

## 2018-06-28 ENCOUNTER — Inpatient Hospital Stay: Admit: 2018-06-28 | Discharge: 2018-06-29

## 2018-06-28 DIAGNOSIS — Z9049 Acquired absence of other specified parts of digestive tract: Secondary | ICD-10-CM

## 2018-06-28 DIAGNOSIS — Z8249 Family history of ischemic heart disease and other diseases of the circulatory system: Secondary | ICD-10-CM

## 2018-06-28 DIAGNOSIS — R0789 Other chest pain: Secondary | ICD-10-CM

## 2018-06-28 DIAGNOSIS — R110 Nausea: Secondary | ICD-10-CM

## 2018-06-28 DIAGNOSIS — Z86718 Personal history of other venous thrombosis and embolism: Secondary | ICD-10-CM

## 2018-06-28 DIAGNOSIS — I4891 Unspecified atrial fibrillation: Secondary | ICD-10-CM

## 2018-06-28 DIAGNOSIS — M329 Systemic lupus erythematosus, unspecified: Secondary | ICD-10-CM

## 2018-06-28 DIAGNOSIS — Z86711 Personal history of pulmonary embolism: Secondary | ICD-10-CM

## 2018-06-28 DIAGNOSIS — M25461 Effusion, right knee: Secondary | ICD-10-CM

## 2018-06-28 DIAGNOSIS — J449 Chronic obstructive pulmonary disease, unspecified: Secondary | ICD-10-CM

## 2018-06-28 DIAGNOSIS — E785 Hyperlipidemia, unspecified: Secondary | ICD-10-CM

## 2018-06-28 DIAGNOSIS — M542 Cervicalgia: Secondary | ICD-10-CM

## 2018-06-28 DIAGNOSIS — R109 Unspecified abdominal pain: Principal | ICD-10-CM

## 2018-06-28 DIAGNOSIS — M791 Myalgia, unspecified site: Secondary | ICD-10-CM

## 2018-06-28 DIAGNOSIS — Z79899 Other long term (current) drug therapy: Secondary | ICD-10-CM

## 2018-06-28 DIAGNOSIS — Z8673 Personal history of transient ischemic attack (TIA), and cerebral infarction without residual deficits: Secondary | ICD-10-CM

## 2018-06-28 DIAGNOSIS — I2510 Atherosclerotic heart disease of native coronary artery without angina pectoris: Secondary | ICD-10-CM

## 2018-06-28 DIAGNOSIS — M546 Pain in thoracic spine: Secondary | ICD-10-CM

## 2018-06-28 DIAGNOSIS — I10 Essential (primary) hypertension: Secondary | ICD-10-CM

## 2018-06-28 DIAGNOSIS — R079 Chest pain, unspecified: Secondary | ICD-10-CM

## 2018-06-28 DIAGNOSIS — M659 Synovitis and tenosynovitis, unspecified: Secondary | ICD-10-CM

## 2018-06-28 DIAGNOSIS — I4892 Unspecified atrial flutter: Secondary | ICD-10-CM

## 2018-06-28 DIAGNOSIS — R0602 Shortness of breath: Secondary | ICD-10-CM

## 2018-06-28 DIAGNOSIS — M7918 Myalgia, other site: Secondary | ICD-10-CM

## 2018-06-28 MED ORDER — BOLUS IV FLUID JX
Freq: Once | INTRAVENOUS | Status: CP
Start: 2018-06-28 — End: ?

## 2018-06-28 MED ORDER — ONDANSETRON 4 MG PO TBDP
4 mg | Freq: Once | ORAL | Status: CP
Start: 2018-06-28 — End: ?

## 2018-06-28 MED ORDER — PREDNISONE 5 MG PO TABS
5 mg | Freq: Every day | ORAL | 0 refills | Status: SS
Start: 2018-06-28 — End: 2018-08-15

## 2018-06-28 MED ORDER — SODIUM CHLORIDE 0.9% FOR FLUSHES
20-180 mL | INTRAVENOUS | Status: CP | PRN
Start: 2018-06-28 — End: ?

## 2018-06-28 MED ORDER — CYCLOBENZAPRINE HCL 10 MG PO TABS
10 mg | Freq: Once | ORAL | Status: CP
Start: 2018-06-28 — End: ?

## 2018-06-28 MED ORDER — CYCLOBENZAPRINE HCL 10 MG PO TABS
10 mg | Freq: Three times a day (TID) | ORAL | 0 refills | Status: CP
Start: 2018-06-28 — End: 2018-08-06

## 2018-06-28 MED ORDER — IOHEXOL 350 MG/ML IV SOLN SH
100 mL | Freq: Once | INTRAVENOUS | Status: CP
Start: 2018-06-28 — End: ?

## 2018-07-01 DIAGNOSIS — M3500 Sicca syndrome, unspecified: Secondary | ICD-10-CM

## 2018-07-01 DIAGNOSIS — M3219 Other organ or system involvement in systemic lupus erythematosus: Principal | ICD-10-CM

## 2018-07-02 MED ORDER — HYDROXYCHLOROQUINE SULFATE 200 MG PO TABS
200 mg | Freq: Two times a day (BID) | ORAL | 1 refills | Status: CP
Start: 2018-07-02 — End: ?

## 2018-07-03 ENCOUNTER — Ambulatory Visit: Attending: Obstetrics & Gynecology | Primary: Internal Medicine

## 2018-07-03 DIAGNOSIS — J449 Chronic obstructive pulmonary disease, unspecified: Secondary | ICD-10-CM

## 2018-07-03 DIAGNOSIS — D251 Intramural leiomyoma of uterus: Secondary | ICD-10-CM

## 2018-07-03 DIAGNOSIS — Z3202 Encounter for pregnancy test, result negative: Secondary | ICD-10-CM

## 2018-07-03 DIAGNOSIS — M1990 Unspecified osteoarthritis, unspecified site: Secondary | ICD-10-CM

## 2018-07-03 DIAGNOSIS — M3500 Sicca syndrome, unspecified: Secondary | ICD-10-CM

## 2018-07-03 DIAGNOSIS — M329 Systemic lupus erythematosus, unspecified: Secondary | ICD-10-CM

## 2018-07-03 DIAGNOSIS — I6350 Cerebral infarction due to unspecified occlusion or stenosis of unspecified cerebral artery: Secondary | ICD-10-CM

## 2018-07-03 DIAGNOSIS — N950 Postmenopausal bleeding: Principal | ICD-10-CM

## 2018-07-03 DIAGNOSIS — IMO0002 Atrial fib/flutter, transient: Secondary | ICD-10-CM

## 2018-07-03 DIAGNOSIS — I10 Essential (primary) hypertension: Principal | ICD-10-CM

## 2018-07-03 DIAGNOSIS — I8290 Acute embolism and thrombosis of unspecified vein: Secondary | ICD-10-CM

## 2018-07-03 DIAGNOSIS — E785 Hyperlipidemia, unspecified: Secondary | ICD-10-CM

## 2018-07-03 DIAGNOSIS — I2699 Other pulmonary embolism without acute cor pulmonale: Secondary | ICD-10-CM

## 2018-07-03 DIAGNOSIS — Z9119 Patient's noncompliance with other medical treatment and regimen: Secondary | ICD-10-CM

## 2018-07-03 DIAGNOSIS — R251 Tremor, unspecified: Secondary | ICD-10-CM

## 2018-07-03 DIAGNOSIS — R9389 Abnormal findings on diagnostic imaging of other specified body structures: Secondary | ICD-10-CM

## 2018-07-03 DIAGNOSIS — I2510 Atherosclerotic heart disease of native coronary artery without angina pectoris: Secondary | ICD-10-CM

## 2018-07-03 DIAGNOSIS — G459 Transient cerebral ischemic attack, unspecified: Secondary | ICD-10-CM

## 2018-08-06 ENCOUNTER — Ambulatory Visit: Attending: Internal Medicine | Primary: Internal Medicine

## 2018-08-06 DIAGNOSIS — R251 Tremor, unspecified: Secondary | ICD-10-CM

## 2018-08-06 DIAGNOSIS — M1990 Unspecified osteoarthritis, unspecified site: Secondary | ICD-10-CM

## 2018-08-06 DIAGNOSIS — M62838 Other muscle spasm: Secondary | ICD-10-CM

## 2018-08-06 DIAGNOSIS — M329 Systemic lupus erythematosus, unspecified: Secondary | ICD-10-CM

## 2018-08-06 DIAGNOSIS — E785 Hyperlipidemia, unspecified: Secondary | ICD-10-CM

## 2018-08-06 DIAGNOSIS — Z9119 Patient's noncompliance with other medical treatment and regimen: Secondary | ICD-10-CM

## 2018-08-06 DIAGNOSIS — I10 Essential (primary) hypertension: Principal | ICD-10-CM

## 2018-08-06 DIAGNOSIS — I8290 Acute embolism and thrombosis of unspecified vein: Secondary | ICD-10-CM

## 2018-08-06 DIAGNOSIS — M3500 Sicca syndrome, unspecified: Principal | ICD-10-CM

## 2018-08-06 DIAGNOSIS — I6350 Cerebral infarction due to unspecified occlusion or stenosis of unspecified cerebral artery: Secondary | ICD-10-CM

## 2018-08-06 DIAGNOSIS — J449 Chronic obstructive pulmonary disease, unspecified: Secondary | ICD-10-CM

## 2018-08-06 DIAGNOSIS — I2699 Other pulmonary embolism without acute cor pulmonale: Secondary | ICD-10-CM

## 2018-08-06 DIAGNOSIS — G459 Transient cerebral ischemic attack, unspecified: Secondary | ICD-10-CM

## 2018-08-06 DIAGNOSIS — M797 Fibromyalgia: Secondary | ICD-10-CM

## 2018-08-06 DIAGNOSIS — IMO0002 Atrial fib/flutter, transient: Secondary | ICD-10-CM

## 2018-08-06 DIAGNOSIS — I2510 Atherosclerotic heart disease of native coronary artery without angina pectoris: Secondary | ICD-10-CM

## 2018-08-06 MED ORDER — BACLOFEN 5 MG PO TABS
5 mg | Freq: Two times a day (BID) | ORAL | 1 refills | Status: CP
Start: 2018-08-06 — End: 2018-08-24

## 2018-08-11 ENCOUNTER — Inpatient Hospital Stay
Admit: 2018-08-11 | Discharge: 2018-08-15 | Disposition: A | Discharge status: Home / Self Care | Admitting: Internal Medicine

## 2018-08-11 ENCOUNTER — Emergency Department: Admit: 2018-08-11 | Discharge: 2018-08-15

## 2018-08-11 DIAGNOSIS — I2510 Atherosclerotic heart disease of native coronary artery without angina pectoris: Secondary | ICD-10-CM

## 2018-08-11 DIAGNOSIS — Z9049 Acquired absence of other specified parts of digestive tract: Secondary | ICD-10-CM

## 2018-08-11 DIAGNOSIS — M329 Systemic lupus erythematosus, unspecified: Secondary | ICD-10-CM

## 2018-08-11 DIAGNOSIS — L03213 Periorbital cellulitis: Principal | ICD-10-CM

## 2018-08-11 DIAGNOSIS — M3500 Sicca syndrome, unspecified: Secondary | ICD-10-CM

## 2018-08-11 DIAGNOSIS — G894 Chronic pain syndrome: Secondary | ICD-10-CM

## 2018-08-11 DIAGNOSIS — J449 Chronic obstructive pulmonary disease, unspecified: Secondary | ICD-10-CM

## 2018-08-11 DIAGNOSIS — Z8673 Personal history of transient ischemic attack (TIA), and cerebral infarction without residual deficits: Secondary | ICD-10-CM

## 2018-08-11 DIAGNOSIS — E785 Hyperlipidemia, unspecified: Secondary | ICD-10-CM

## 2018-08-11 DIAGNOSIS — Z79891 Long term (current) use of opiate analgesic: Secondary | ICD-10-CM

## 2018-08-11 DIAGNOSIS — I4891 Unspecified atrial fibrillation: Secondary | ICD-10-CM

## 2018-08-11 DIAGNOSIS — M1990 Unspecified osteoarthritis, unspecified site: Secondary | ICD-10-CM

## 2018-08-11 DIAGNOSIS — I6350 Cerebral infarction due to unspecified occlusion or stenosis of unspecified cerebral artery: Secondary | ICD-10-CM

## 2018-08-11 DIAGNOSIS — H5789 Other specified disorders of eye and adnexa: Secondary | ICD-10-CM

## 2018-08-11 DIAGNOSIS — M797 Fibromyalgia: Secondary | ICD-10-CM

## 2018-08-11 DIAGNOSIS — Z9119 Patient's noncompliance with other medical treatment and regimen: Secondary | ICD-10-CM

## 2018-08-11 DIAGNOSIS — Z86711 Personal history of pulmonary embolism: Secondary | ICD-10-CM

## 2018-08-11 DIAGNOSIS — H538 Other visual disturbances: Secondary | ICD-10-CM

## 2018-08-11 DIAGNOSIS — R251 Tremor, unspecified: Secondary | ICD-10-CM

## 2018-08-11 DIAGNOSIS — Z86718 Personal history of other venous thrombosis and embolism: Secondary | ICD-10-CM

## 2018-08-11 DIAGNOSIS — K219 Gastro-esophageal reflux disease without esophagitis: Secondary | ICD-10-CM

## 2018-08-11 DIAGNOSIS — IMO0002 Atrial fib/flutter, transient: Secondary | ICD-10-CM

## 2018-08-11 DIAGNOSIS — Z87828 Personal history of other (healed) physical injury and trauma: Secondary | ICD-10-CM

## 2018-08-11 DIAGNOSIS — I8290 Acute embolism and thrombosis of unspecified vein: Secondary | ICD-10-CM

## 2018-08-11 DIAGNOSIS — G459 Transient cerebral ischemic attack, unspecified: Secondary | ICD-10-CM

## 2018-08-11 DIAGNOSIS — I2699 Other pulmonary embolism without acute cor pulmonale: Secondary | ICD-10-CM

## 2018-08-11 DIAGNOSIS — I10 Essential (primary) hypertension: Principal | ICD-10-CM

## 2018-08-11 MED ORDER — TRAZODONE HCL 50 MG PO TABS
50 mg | Freq: Every evening | ORAL | Status: DC | PRN
Start: 2018-08-11 — End: 2018-08-16

## 2018-08-11 MED ORDER — VANCOMYCIN HCL 1750MG/NS 500ML IVPB JX
1750 mg | Freq: Once | INTRAVENOUS | Status: CP
Start: 2018-08-11 — End: ?

## 2018-08-11 MED ORDER — PILOCARPINE HCL 5 MG PO TABS
5 mg | Freq: Two times a day (BID) | ORAL | Status: DC
Start: 2018-08-11 — End: 2018-08-16

## 2018-08-11 MED ORDER — SERTRALINE HCL 50 MG PO TABS
50 mg | Freq: Every day | ORAL | Status: DC
Start: 2018-08-11 — End: 2018-08-16

## 2018-08-11 MED ORDER — GLUCOSE 4 G PO CHEW JX
16 g | ORAL | Status: DC | PRN
Start: 2018-08-11 — End: 2018-08-16

## 2018-08-11 MED ORDER — MORPHINE SULFATE 2 MG/ML IV SOLN CUSTOM JX
2 mg | Freq: Once | INTRAVENOUS | Status: CP
Start: 2018-08-11 — End: ?

## 2018-08-11 MED ORDER — GABAPENTIN 300 MG PO CAPS
600 mg | Freq: Two times a day (BID) | ORAL | Status: DC
Start: 2018-08-11 — End: 2018-08-16

## 2018-08-11 MED ORDER — PANTOPRAZOLE SODIUM 40 MG PO TBEC
40 mg | Freq: Every morning | ORAL | Status: DC
Start: 2018-08-11 — End: 2018-08-16

## 2018-08-11 MED ORDER — ACETAMINOPHEN 325 MG PO TABS
650 mg | ORAL | Status: DC | PRN
Start: 2018-08-11 — End: 2018-08-16

## 2018-08-11 MED ORDER — OMEPRAZOLE MAGNESIUM 20 MG PO TBEC
20 mg | Freq: Two times a day (BID) | ORAL | Status: DC
Start: 2018-08-11 — End: 2018-08-12

## 2018-08-11 MED ORDER — METHOCARBAMOL 500 MG PO TABS
500 mg | Freq: Three times a day (TID) | ORAL | Status: DC
Start: 2018-08-11 — End: 2018-08-16

## 2018-08-11 MED ORDER — IOHEXOL 350 MG/ML IV SOLN SH
100 mL | Freq: Once | INTRAVENOUS | Status: CP
Start: 2018-08-11 — End: ?

## 2018-08-11 MED ORDER — BISACODYL 5 MG PO TBEC
10 mg | Freq: Every day | ORAL | Status: DC | PRN
Start: 2018-08-11 — End: 2018-08-16

## 2018-08-11 MED ORDER — HYDROXYCHLOROQUINE SULFATE 200 MG PO TABS
200 mg | Freq: Two times a day (BID) | ORAL | Status: DC
Start: 2018-08-11 — End: 2018-08-16

## 2018-08-11 MED ORDER — DILTIAZEM HCL ER 180 MG PO CP24
180 mg | Freq: Every day | ORAL | Status: DC
Start: 2018-08-11 — End: 2018-08-16

## 2018-08-11 MED ORDER — FLUORESCEIN SODIUM 1 MG OP STRP
1 mg | Freq: Once | OPHTHALMIC | Status: CP
Start: 2018-08-11 — End: ?

## 2018-08-11 MED ORDER — PHARMACY CONSULT-VANCOMYCIN JX
Status: DC
Start: 2018-08-11 — End: 2018-08-14

## 2018-08-11 MED ORDER — DEXTROSE 50 % IV SOLN
30 mL | INTRAVENOUS | Status: DC | PRN
Start: 2018-08-11 — End: 2018-08-16

## 2018-08-11 MED ORDER — PIPERACILLIN-TAZOBACTAM 4.5 G IV EXTENDED INFUSION MBP JX
4.5 g | Freq: Three times a day (TID) | INTRAVENOUS | Status: DC
Start: 2018-08-11 — End: 2018-08-14

## 2018-08-11 MED ORDER — TRAZODONE HCL 50 MG PO TABS
50 mg | Freq: Every evening | ORAL | Status: DC
Start: 2018-08-11 — End: 2018-08-16

## 2018-08-11 MED ORDER — ALUM & MAG HYDROX-SIMETH 1200-1200-120 MG/30ML PO SUSP UD
30 mL | Freq: Four times a day (QID) | ORAL | Status: DC | PRN
Start: 2018-08-11 — End: 2018-08-16

## 2018-08-11 MED ORDER — ONDANSETRON 4 MG PO TBDP
4 mg | Freq: Four times a day (QID) | ORAL | Status: DC | PRN
Start: 2018-08-11 — End: 2018-08-16

## 2018-08-11 MED ORDER — DIPHENHYDRAMINE HCL 25 MG PO CAPS
25 mg | Freq: Four times a day (QID) | ORAL | Status: DC | PRN
Start: 2018-08-11 — End: 2018-08-16

## 2018-08-11 MED ORDER — PIPERACILLIN SOD-TAZOBACTAM 4.5 G IV MBP
4.5 g | Freq: Once | INTRAVENOUS | Status: CP
Start: 2018-08-11 — End: ?

## 2018-08-11 MED ORDER — SODIUM CHLORIDE 0.9% FOR FLUSHES
20-180 mL | INTRAVENOUS | Status: CP | PRN
Start: 2018-08-11 — End: ?

## 2018-08-11 MED ORDER — TETRACAINE HCL 0.5 % OP SOLN
1 [drp] | Freq: Once | OPHTHALMIC | Status: CP
Start: 2018-08-11 — End: ?

## 2018-08-11 MED ORDER — PILOCARPINE HCL 5 MG PO TABS
5 mg | Freq: Two times a day (BID) | ORAL | Status: DC
Start: 2018-08-11 — End: 2018-08-12

## 2018-08-11 MED ORDER — SODIUM CHLORIDE 0.9 % IV SOLN
1000 mL | INTRAVENOUS | Status: DC
Start: 2018-08-11 — End: 2018-08-16

## 2018-08-11 MED ORDER — HYDROCHLOROTHIAZIDE 25 MG PO TABS
25 mg | Freq: Every day | ORAL | Status: DC
Start: 2018-08-11 — End: 2018-08-16

## 2018-08-11 MED ORDER — LABETALOL HCL 5 MG/ML IV SOLN SH
10 mg | Freq: Four times a day (QID) | INTRAVENOUS | Status: DC | PRN
Start: 2018-08-11 — End: 2018-08-16

## 2018-08-11 MED ORDER — ONDANSETRON HCL 4 MG/2ML IJ SOLN
4 mg | Freq: Four times a day (QID) | INTRAVENOUS | Status: DC | PRN
Start: 2018-08-11 — End: 2018-08-16

## 2018-08-11 MED ORDER — ENOXAPARIN SODIUM 40 MG/0.4ML SC SOLN
40 mg | Freq: Every day | SUBCUTANEOUS | Status: DC
Start: 2018-08-11 — End: 2018-08-16

## 2018-08-11 MED ORDER — IPRATROPIUM-ALBUTEROL 0.5-2.5 (3) MG/3ML IN SOLN
3 mL | RESPIRATORY_TRACT | Status: DC | PRN
Start: 2018-08-11 — End: 2018-08-16

## 2018-08-11 MED ORDER — VANCOMYCIN HCL 1500 MG IV SOLR MBP KIT JX
1500 mg | Freq: Two times a day (BID) | INTRAVENOUS | Status: DC
Start: 2018-08-11 — End: 2018-08-13

## 2018-08-12 ENCOUNTER — Encounter: Attending: Family Medicine | Primary: Internal Medicine

## 2018-08-12 MED ORDER — HYDROCODONE-ACETAMINOPHEN 5-325 MG PO TABS
1 | ORAL_TABLET | Freq: Four times a day (QID) | ORAL | Status: DC | PRN
Start: 2018-08-12 — End: 2018-08-16

## 2018-08-13 MED ORDER — VANCOMYCIN HCL 1000 MG IV SOLR MBP/V2B KIT JX
1000 mg | Freq: Two times a day (BID) | INTRAVENOUS | Status: DC
Start: 2018-08-13 — End: 2018-08-14

## 2018-08-14 ENCOUNTER — Encounter: Primary: Internal Medicine

## 2018-08-14 MED ORDER — CLINDAMYCIN HCL 150 MG PO CAPS
450 mg | Freq: Three times a day (TID) | ORAL | Status: DC
Start: 2018-08-14 — End: 2018-08-16

## 2018-08-14 MED ORDER — TETANUS-DIPHTH-ACELL PERTUSSIS 5-2.5-18.5 LF-MCG/0.5 IM SUSP
0.5 mL | Freq: Once | INTRAMUSCULAR | Status: DC
Start: 2018-08-14 — End: 2018-08-14

## 2018-08-14 MED ORDER — TETANUS-DIPHTH-ACELL PERTUSSIS 5-2-15.5 LF-MCG/0.5 IM SUSP
.5 mL | Freq: Once | INTRAMUSCULAR | Status: CP
Start: 2018-08-14 — End: ?

## 2018-08-14 MED ORDER — SULFAMETHOXAZOLE-TRIMETHOPRIM 800-160 MG PO TABS
1 | ORAL_TABLET | Freq: Three times a day (TID) | ORAL | Status: DC
Start: 2018-08-14 — End: 2018-08-16

## 2018-08-15 MED ORDER — CLINDAMYCIN HCL 150 MG PO CAPS
450 mg | Freq: Three times a day (TID) | ORAL | 0 refills | Status: CP
Start: 2018-08-15 — End: ?

## 2018-08-15 MED ORDER — SULFAMETHOXAZOLE-TRIMETHOPRIM 800-160 MG PO TABS
1 | ORAL_TABLET | Freq: Three times a day (TID) | ORAL | 0 refills | Status: CP
Start: 2018-08-15 — End: ?

## 2018-08-17 ENCOUNTER — Inpatient Hospital Stay: Admit: 2018-08-17 | Discharge: 2018-08-17

## 2018-08-17 ENCOUNTER — Emergency Department: Admit: 2018-08-17 | Discharge: 2018-08-17

## 2018-08-17 DIAGNOSIS — Z79899 Other long term (current) drug therapy: Secondary | ICD-10-CM

## 2018-08-17 DIAGNOSIS — Z8673 Personal history of transient ischemic attack (TIA), and cerebral infarction without residual deficits: Secondary | ICD-10-CM

## 2018-08-17 DIAGNOSIS — Z86711 Personal history of pulmonary embolism: Secondary | ICD-10-CM

## 2018-08-17 DIAGNOSIS — I6350 Cerebral infarction due to unspecified occlusion or stenosis of unspecified cerebral artery: Secondary | ICD-10-CM

## 2018-08-17 DIAGNOSIS — R251 Tremor, unspecified: Secondary | ICD-10-CM

## 2018-08-17 DIAGNOSIS — I4892 Unspecified atrial flutter: Secondary | ICD-10-CM

## 2018-08-17 DIAGNOSIS — E785 Hyperlipidemia, unspecified: Secondary | ICD-10-CM

## 2018-08-17 DIAGNOSIS — Z9119 Patient's noncompliance with other medical treatment and regimen: Secondary | ICD-10-CM

## 2018-08-17 DIAGNOSIS — Z9049 Acquired absence of other specified parts of digestive tract: Secondary | ICD-10-CM

## 2018-08-17 DIAGNOSIS — M329 Systemic lupus erythematosus, unspecified: Secondary | ICD-10-CM

## 2018-08-17 DIAGNOSIS — S0185XD Open bite of other part of head, subsequent encounter: Principal | ICD-10-CM

## 2018-08-17 DIAGNOSIS — M1990 Unspecified osteoarthritis, unspecified site: Secondary | ICD-10-CM

## 2018-08-17 DIAGNOSIS — J449 Chronic obstructive pulmonary disease, unspecified: Secondary | ICD-10-CM

## 2018-08-17 DIAGNOSIS — I2510 Atherosclerotic heart disease of native coronary artery without angina pectoris: Secondary | ICD-10-CM

## 2018-08-17 DIAGNOSIS — W540XXD Bitten by dog, subsequent encounter: Secondary | ICD-10-CM

## 2018-08-17 DIAGNOSIS — M3500 Sicca syndrome, unspecified: Secondary | ICD-10-CM

## 2018-08-17 DIAGNOSIS — I10 Essential (primary) hypertension: Secondary | ICD-10-CM

## 2018-08-17 DIAGNOSIS — I8290 Acute embolism and thrombosis of unspecified vein: Secondary | ICD-10-CM

## 2018-08-17 DIAGNOSIS — IMO0002 Atrial fib/flutter, transient: Secondary | ICD-10-CM

## 2018-08-17 DIAGNOSIS — I4891 Unspecified atrial fibrillation: Secondary | ICD-10-CM

## 2018-08-17 DIAGNOSIS — I2699 Other pulmonary embolism without acute cor pulmonale: Secondary | ICD-10-CM

## 2018-08-17 DIAGNOSIS — J029 Acute pharyngitis, unspecified: Secondary | ICD-10-CM

## 2018-08-17 DIAGNOSIS — Z4889 Encounter for other specified surgical aftercare: Secondary | ICD-10-CM

## 2018-08-17 DIAGNOSIS — Z823 Family history of stroke: Secondary | ICD-10-CM

## 2018-08-17 DIAGNOSIS — G459 Transient cerebral ischemic attack, unspecified: Secondary | ICD-10-CM

## 2018-08-18 ENCOUNTER — Encounter: Attending: Family Medicine | Primary: Internal Medicine

## 2018-08-24 DIAGNOSIS — M62838 Other muscle spasm: Principal | ICD-10-CM

## 2018-08-24 MED ORDER — BACLOFEN 5 MG PO TABS
5 mg | Freq: Two times a day (BID) | ORAL | 1 refills | Status: CN
Start: 2018-08-24 — End: ?

## 2018-08-24 MED ORDER — BACLOFEN 10 MG PO TABS
5 mg | Freq: Two times a day (BID) | ORAL | 1 refills | Status: CP
Start: 2018-08-24 — End: ?

## 2018-08-31 ENCOUNTER — Encounter: Attending: Ophthalmology | Primary: Internal Medicine

## 2018-09-08 ENCOUNTER — Ambulatory Visit: Attending: Ophthalmology | Primary: Internal Medicine

## 2018-09-08 DIAGNOSIS — J449 Chronic obstructive pulmonary disease, unspecified: Secondary | ICD-10-CM

## 2018-09-08 DIAGNOSIS — I2510 Atherosclerotic heart disease of native coronary artery without angina pectoris: Secondary | ICD-10-CM

## 2018-09-08 DIAGNOSIS — E785 Hyperlipidemia, unspecified: Secondary | ICD-10-CM

## 2018-09-08 DIAGNOSIS — IMO0002 Atrial fib/flutter, transient: Secondary | ICD-10-CM

## 2018-09-08 DIAGNOSIS — I10 Essential (primary) hypertension: Principal | ICD-10-CM

## 2018-09-08 DIAGNOSIS — Z79899 Other long term (current) drug therapy: Principal | ICD-10-CM

## 2018-09-08 DIAGNOSIS — M3500 Sicca syndrome, unspecified: Secondary | ICD-10-CM

## 2018-09-08 DIAGNOSIS — Z9119 Patient's noncompliance with other medical treatment and regimen: Secondary | ICD-10-CM

## 2018-09-08 DIAGNOSIS — R251 Tremor, unspecified: Secondary | ICD-10-CM

## 2018-09-08 DIAGNOSIS — I8290 Acute embolism and thrombosis of unspecified vein: Secondary | ICD-10-CM

## 2018-09-08 DIAGNOSIS — I2699 Other pulmonary embolism without acute cor pulmonale: Secondary | ICD-10-CM

## 2018-09-08 DIAGNOSIS — G459 Transient cerebral ischemic attack, unspecified: Secondary | ICD-10-CM

## 2018-09-08 DIAGNOSIS — M329 Systemic lupus erythematosus, unspecified: Secondary | ICD-10-CM

## 2018-09-08 DIAGNOSIS — M1990 Unspecified osteoarthritis, unspecified site: Secondary | ICD-10-CM

## 2018-09-08 DIAGNOSIS — H2513 Age-related nuclear cataract, bilateral: Secondary | ICD-10-CM

## 2018-09-08 DIAGNOSIS — I6350 Cerebral infarction due to unspecified occlusion or stenosis of unspecified cerebral artery: Secondary | ICD-10-CM

## 2018-09-08 MED ORDER — CYCLOSPORINE 0.05 % OP EMUL
1 [drp] | Freq: Two times a day (BID) | OPHTHALMIC | 23 refills | Status: CP
Start: 2018-09-08 — End: ?

## 2018-09-15 ENCOUNTER — Ambulatory Visit: Attending: Rheumatology | Primary: Internal Medicine

## 2018-09-15 DIAGNOSIS — M1990 Unspecified osteoarthritis, unspecified site: Secondary | ICD-10-CM

## 2018-09-15 DIAGNOSIS — M3500 Sicca syndrome, unspecified: Secondary | ICD-10-CM

## 2018-09-15 DIAGNOSIS — R251 Tremor, unspecified: Secondary | ICD-10-CM

## 2018-09-15 DIAGNOSIS — G8929 Other chronic pain: Secondary | ICD-10-CM

## 2018-09-15 DIAGNOSIS — M329 Systemic lupus erythematosus, unspecified: Secondary | ICD-10-CM

## 2018-09-15 DIAGNOSIS — I2510 Atherosclerotic heart disease of native coronary artery without angina pectoris: Secondary | ICD-10-CM

## 2018-09-15 DIAGNOSIS — I10 Essential (primary) hypertension: Principal | ICD-10-CM

## 2018-09-15 DIAGNOSIS — M25562 Pain in left knee: Principal | ICD-10-CM

## 2018-09-15 DIAGNOSIS — I2699 Other pulmonary embolism without acute cor pulmonale: Secondary | ICD-10-CM

## 2018-09-15 DIAGNOSIS — I8290 Acute embolism and thrombosis of unspecified vein: Secondary | ICD-10-CM

## 2018-09-15 DIAGNOSIS — J449 Chronic obstructive pulmonary disease, unspecified: Secondary | ICD-10-CM

## 2018-09-15 DIAGNOSIS — E785 Hyperlipidemia, unspecified: Secondary | ICD-10-CM

## 2018-09-15 DIAGNOSIS — Z9119 Patient's noncompliance with other medical treatment and regimen: Secondary | ICD-10-CM

## 2018-09-15 DIAGNOSIS — G459 Transient cerebral ischemic attack, unspecified: Secondary | ICD-10-CM

## 2018-09-15 DIAGNOSIS — IMO0002 Atrial fib/flutter, transient: Secondary | ICD-10-CM

## 2018-09-15 DIAGNOSIS — I6350 Cerebral infarction due to unspecified occlusion or stenosis of unspecified cerebral artery: Secondary | ICD-10-CM

## 2018-09-21 ENCOUNTER — Ambulatory Visit: Attending: Internal Medicine | Primary: Internal Medicine

## 2018-09-21 DIAGNOSIS — IMO0002 Atrial fib/flutter, transient: Secondary | ICD-10-CM

## 2018-09-21 DIAGNOSIS — M3219 Other organ or system involvement in systemic lupus erythematosus: Secondary | ICD-10-CM

## 2018-09-21 DIAGNOSIS — J449 Chronic obstructive pulmonary disease, unspecified: Secondary | ICD-10-CM

## 2018-09-21 DIAGNOSIS — M1990 Unspecified osteoarthritis, unspecified site: Secondary | ICD-10-CM

## 2018-09-21 DIAGNOSIS — M3500 Sicca syndrome, unspecified: Secondary | ICD-10-CM

## 2018-09-21 DIAGNOSIS — E785 Hyperlipidemia, unspecified: Secondary | ICD-10-CM

## 2018-09-21 DIAGNOSIS — M8700 Idiopathic aseptic necrosis of unspecified bone: Principal | ICD-10-CM

## 2018-09-21 DIAGNOSIS — Z9119 Patient's noncompliance with other medical treatment and regimen: Secondary | ICD-10-CM

## 2018-09-21 DIAGNOSIS — I2510 Atherosclerotic heart disease of native coronary artery without angina pectoris: Secondary | ICD-10-CM

## 2018-09-21 DIAGNOSIS — I8290 Acute embolism and thrombosis of unspecified vein: Secondary | ICD-10-CM

## 2018-09-21 DIAGNOSIS — M329 Systemic lupus erythematosus, unspecified: Secondary | ICD-10-CM

## 2018-09-21 DIAGNOSIS — I2699 Other pulmonary embolism without acute cor pulmonale: Secondary | ICD-10-CM

## 2018-09-21 DIAGNOSIS — R251 Tremor, unspecified: Secondary | ICD-10-CM

## 2018-09-21 DIAGNOSIS — I6350 Cerebral infarction due to unspecified occlusion or stenosis of unspecified cerebral artery: Secondary | ICD-10-CM

## 2018-09-21 DIAGNOSIS — I10 Essential (primary) hypertension: Principal | ICD-10-CM

## 2018-09-21 DIAGNOSIS — G459 Transient cerebral ischemic attack, unspecified: Secondary | ICD-10-CM

## 2018-09-22 ENCOUNTER — Inpatient Hospital Stay: Admit: 2018-09-22 | Discharge: 2018-10-15 | Primary: Internal Medicine

## 2018-09-22 DIAGNOSIS — M797 Fibromyalgia: Principal | ICD-10-CM

## 2018-09-22 DIAGNOSIS — M62838 Other muscle spasm: Secondary | ICD-10-CM

## 2018-09-23 DIAGNOSIS — G43009 Migraine without aura, not intractable, without status migrainosus: Principal | ICD-10-CM

## 2018-09-24 ENCOUNTER — Inpatient Hospital Stay: Admit: 2018-09-24 | Discharge: 2018-09-24

## 2018-09-24 DIAGNOSIS — I10 Essential (primary) hypertension: Secondary | ICD-10-CM

## 2018-09-24 DIAGNOSIS — E785 Hyperlipidemia, unspecified: Secondary | ICD-10-CM

## 2018-09-24 DIAGNOSIS — R51 Headache: Principal | ICD-10-CM

## 2018-09-24 DIAGNOSIS — M329 Systemic lupus erythematosus, unspecified: Secondary | ICD-10-CM

## 2018-09-24 DIAGNOSIS — J449 Chronic obstructive pulmonary disease, unspecified: Secondary | ICD-10-CM

## 2018-09-24 DIAGNOSIS — M3500 Sicca syndrome, unspecified: Secondary | ICD-10-CM

## 2018-09-24 DIAGNOSIS — I2510 Atherosclerotic heart disease of native coronary artery without angina pectoris: Secondary | ICD-10-CM

## 2018-09-24 DIAGNOSIS — Z9049 Acquired absence of other specified parts of digestive tract: Secondary | ICD-10-CM

## 2018-09-24 DIAGNOSIS — Z9119 Patient's noncompliance with other medical treatment and regimen: Secondary | ICD-10-CM

## 2018-09-24 DIAGNOSIS — Z8673 Personal history of transient ischemic attack (TIA), and cerebral infarction without residual deficits: Secondary | ICD-10-CM

## 2018-09-24 DIAGNOSIS — R251 Tremor, unspecified: Secondary | ICD-10-CM

## 2018-09-24 DIAGNOSIS — Z79899 Other long term (current) drug therapy: Secondary | ICD-10-CM

## 2018-09-24 DIAGNOSIS — R110 Nausea: Secondary | ICD-10-CM

## 2018-09-24 DIAGNOSIS — I2699 Other pulmonary embolism without acute cor pulmonale: Secondary | ICD-10-CM

## 2018-09-24 DIAGNOSIS — H53149 Visual discomfort, unspecified: Secondary | ICD-10-CM

## 2018-09-24 DIAGNOSIS — I8290 Acute embolism and thrombosis of unspecified vein: Secondary | ICD-10-CM

## 2018-09-24 DIAGNOSIS — M1990 Unspecified osteoarthritis, unspecified site: Secondary | ICD-10-CM

## 2018-09-24 DIAGNOSIS — IMO0002 Atrial fib/flutter, transient: Secondary | ICD-10-CM

## 2018-09-24 DIAGNOSIS — I6350 Cerebral infarction due to unspecified occlusion or stenosis of unspecified cerebral artery: Secondary | ICD-10-CM

## 2018-09-24 DIAGNOSIS — G459 Transient cerebral ischemic attack, unspecified: Secondary | ICD-10-CM

## 2018-09-24 DIAGNOSIS — I4891 Unspecified atrial fibrillation: Secondary | ICD-10-CM

## 2018-09-24 MED ORDER — METOCLOPRAMIDE HCL 5 MG/ML IJ SOLN
10 mg | Freq: Once | INTRAVENOUS | Status: CP
Start: 2018-09-24 — End: ?

## 2018-09-24 MED ORDER — BOLUS IV FLUID JX
Freq: Once | INTRAVENOUS | Status: CP
Start: 2018-09-24 — End: ?

## 2018-09-24 MED ORDER — KETOROLAC TROMETHAMINE 15 MG/ML IJ SOLN
15 mg | Freq: Once | INTRAVENOUS | Status: CP
Start: 2018-09-24 — End: ?

## 2018-09-24 MED ORDER — AIMOVIG 70 MG/ML SC SOAJ
0 refills
Start: 2018-09-24 — End: ?

## 2018-09-24 MED ORDER — HYDROMORPHONE HCL 1 MG/ML IJ SOLN
.5 mg | Freq: Once | INTRAVENOUS | Status: CP
Start: 2018-09-24 — End: ?

## 2018-09-24 MED ORDER — PROCHLORPERAZINE EDISYLATE 10 MG/2ML IJ SOLN
10 mg | Freq: Once | INTRAVENOUS | Status: CP
Start: 2018-09-24 — End: ?

## 2018-09-24 MED ORDER — METOCLOPRAMIDE HCL 10 MG PO TABS
10 mg | Freq: Three times a day (TID) | ORAL | 0 refills | Status: CP | PRN
Start: 2018-09-24 — End: ?

## 2018-09-24 MED ORDER — DEXAMETHASONE SODIUM PHOSPHATE 4 MG/ML CUSTOM COMPONENT JX
10 mg | Freq: Once | INTRAVENOUS | Status: CP
Start: 2018-09-24 — End: ?

## 2018-09-24 MED ORDER — DIPHENHYDRAMINE HCL 50 MG/ML IJ SOLN
25 mg | Freq: Once | INTRAVENOUS | Status: CP
Start: 2018-09-24 — End: ?

## 2018-09-28 DIAGNOSIS — M3500 Sicca syndrome, unspecified: Principal | ICD-10-CM

## 2018-09-28 DIAGNOSIS — G43009 Migraine without aura, not intractable, without status migrainosus: Principal | ICD-10-CM

## 2018-09-29 MED ORDER — ERENUMAB-AOOE 70 MG/ML SC SOAJ
70 mg | SUBCUTANEOUS | 11 refills | Status: CP
Start: 2018-09-29 — End: 2018-10-15

## 2018-10-15 ENCOUNTER — Ambulatory Visit: Admit: 2018-10-15 | Discharge: 2018-10-15 | Attending: Acute Care | Primary: Internal Medicine

## 2018-10-15 DIAGNOSIS — Z823 Family history of stroke: Secondary | ICD-10-CM

## 2018-10-15 DIAGNOSIS — G43709 Chronic migraine without aura, not intractable, without status migrainosus: Secondary | ICD-10-CM

## 2018-10-15 DIAGNOSIS — M797 Fibromyalgia: Secondary | ICD-10-CM

## 2018-10-15 DIAGNOSIS — I6350 Cerebral infarction due to unspecified occlusion or stenosis of unspecified cerebral artery: Secondary | ICD-10-CM

## 2018-10-15 DIAGNOSIS — J449 Chronic obstructive pulmonary disease, unspecified: Secondary | ICD-10-CM

## 2018-10-15 DIAGNOSIS — Z86711 Personal history of pulmonary embolism: Secondary | ICD-10-CM

## 2018-10-15 DIAGNOSIS — I10 Essential (primary) hypertension: Principal | ICD-10-CM

## 2018-10-15 DIAGNOSIS — F5104 Psychophysiologic insomnia: Secondary | ICD-10-CM

## 2018-10-15 DIAGNOSIS — R030 Elevated blood-pressure reading, without diagnosis of hypertension: Secondary | ICD-10-CM

## 2018-10-15 DIAGNOSIS — M3500 Sicca syndrome, unspecified: Secondary | ICD-10-CM

## 2018-10-15 DIAGNOSIS — Z9049 Acquired absence of other specified parts of digestive tract: Secondary | ICD-10-CM

## 2018-10-15 DIAGNOSIS — M542 Cervicalgia: Secondary | ICD-10-CM

## 2018-10-15 DIAGNOSIS — IMO0002 Atrial fib/flutter, transient: Secondary | ICD-10-CM

## 2018-10-15 DIAGNOSIS — Z9119 Patient's noncompliance with other medical treatment and regimen: Secondary | ICD-10-CM

## 2018-10-15 DIAGNOSIS — I8290 Acute embolism and thrombosis of unspecified vein: Secondary | ICD-10-CM

## 2018-10-15 DIAGNOSIS — E785 Hyperlipidemia, unspecified: Secondary | ICD-10-CM

## 2018-10-15 DIAGNOSIS — Z8673 Personal history of transient ischemic attack (TIA), and cerebral infarction without residual deficits: Secondary | ICD-10-CM

## 2018-10-15 DIAGNOSIS — M329 Systemic lupus erythematosus, unspecified: Secondary | ICD-10-CM

## 2018-10-15 DIAGNOSIS — G43009 Migraine without aura, not intractable, without status migrainosus: Principal | ICD-10-CM

## 2018-10-15 DIAGNOSIS — M1990 Unspecified osteoarthritis, unspecified site: Secondary | ICD-10-CM

## 2018-10-15 DIAGNOSIS — I4891 Unspecified atrial fibrillation: Secondary | ICD-10-CM

## 2018-10-15 DIAGNOSIS — I2510 Atherosclerotic heart disease of native coronary artery without angina pectoris: Secondary | ICD-10-CM

## 2018-10-15 DIAGNOSIS — I2699 Other pulmonary embolism without acute cor pulmonale: Secondary | ICD-10-CM

## 2018-10-15 DIAGNOSIS — G459 Transient cerebral ischemic attack, unspecified: Secondary | ICD-10-CM

## 2018-10-15 DIAGNOSIS — R251 Tremor, unspecified: Secondary | ICD-10-CM

## 2018-10-15 MED ORDER — TRAZODONE HCL 50 MG PO TABS
2 refills | Status: CP
Start: 2018-10-15 — End: ?

## 2018-10-15 MED ORDER — ERENUMAB-AOOE 140 MG/ML SC SOAJ
140 mg | SUBCUTANEOUS | 8 refills | Status: CP
Start: 2018-10-15 — End: ?

## 2018-10-21 ENCOUNTER — Encounter: Attending: Internal Medicine | Primary: Internal Medicine

## 2019-02-10 ENCOUNTER — Encounter: Primary: Internal Medicine

## 2019-02-18 ENCOUNTER — Encounter: Primary: Internal Medicine

## 2019-03-08 DIAGNOSIS — G894 Chronic pain syndrome: Principal | ICD-10-CM

## 2019-03-09 ENCOUNTER — Encounter: Attending: Ophthalmology | Primary: Internal Medicine

## 2019-03-09 MED ORDER — GABAPENTIN 600 MG PO TABS
0 refills
Start: 2019-03-09 — End: ?

## 2019-03-12 DIAGNOSIS — M170 Bilateral primary osteoarthritis of knee: Secondary | ICD-10-CM

## 2019-03-12 DIAGNOSIS — M1710 Unilateral primary osteoarthritis, unspecified knee: Principal | ICD-10-CM

## 2019-03-12 DIAGNOSIS — M3500 Sicca syndrome, unspecified: Secondary | ICD-10-CM

## 2019-03-12 DIAGNOSIS — M62838 Other muscle spasm: Principal | ICD-10-CM

## 2019-03-12 DIAGNOSIS — F323 Major depressive disorder, single episode, severe with psychotic features: Secondary | ICD-10-CM

## 2019-03-12 DIAGNOSIS — M3219 Other organ or system involvement in systemic lupus erythematosus: Secondary | ICD-10-CM

## 2019-03-12 DIAGNOSIS — I10 Essential (primary) hypertension: Secondary | ICD-10-CM

## 2019-03-12 DIAGNOSIS — G894 Chronic pain syndrome: Secondary | ICD-10-CM

## 2019-03-12 DIAGNOSIS — R609 Edema, unspecified: Secondary | ICD-10-CM

## 2019-03-12 MED ORDER — DILTIAZEM HCL ER 180 MG PO CP24
ORAL | 0 refills | 30.00000 days | Status: CP
Start: 2019-03-12 — End: ?

## 2019-03-12 MED ORDER — GABAPENTIN 600 MG PO TABS
600 mg | Freq: Three times a day (TID) | ORAL | 0 refills | 30.00000 days | Status: CP
Start: 2019-03-12 — End: ?

## 2019-03-12 MED ORDER — HYDROXYCHLOROQUINE SULFATE 200 MG PO TABS
200 mg | Freq: Every day | ORAL | 0 refills | 30.00 days | Status: CP
Start: 2019-03-12 — End: 2019-05-19

## 2019-03-12 MED ORDER — DICLOFENAC SODIUM 1 % TD GEL
4 g | Freq: Four times a day (QID) | TOPICAL | 0 refills | 13.00000 days | Status: CP
Start: 2019-03-12 — End: ?

## 2019-03-12 MED ORDER — SERTRALINE HCL 50 MG PO TABS
0 refills | Status: CP
Start: 2019-03-12 — End: ?

## 2019-03-12 MED ORDER — HYDROCHLOROTHIAZIDE 25 MG PO TABS
0 refills | Status: CP
Start: 2019-03-12 — End: ?

## 2019-03-12 MED ORDER — BACLOFEN 10 MG PO TABS
5 mg | Freq: Two times a day (BID) | ORAL | 1 refills | Status: CP
Start: 2019-03-12 — End: ?

## 2019-03-18 ENCOUNTER — Ambulatory Visit: Attending: Internal Medicine | Primary: Internal Medicine

## 2019-03-18 DIAGNOSIS — I8290 Acute embolism and thrombosis of unspecified vein: Secondary | ICD-10-CM

## 2019-03-18 DIAGNOSIS — Z2821 Immunization not carried out because of patient refusal: Secondary | ICD-10-CM

## 2019-03-18 DIAGNOSIS — J449 Chronic obstructive pulmonary disease, unspecified: Secondary | ICD-10-CM

## 2019-03-18 DIAGNOSIS — I2510 Atherosclerotic heart disease of native coronary artery without angina pectoris: Secondary | ICD-10-CM

## 2019-03-18 DIAGNOSIS — Z6834 Body mass index (BMI) 34.0-34.9, adult: Secondary | ICD-10-CM

## 2019-03-18 DIAGNOSIS — I10 Essential (primary) hypertension: Secondary | ICD-10-CM

## 2019-03-18 DIAGNOSIS — Z1239 Encounter for other screening for malignant neoplasm of breast: Principal | ICD-10-CM

## 2019-03-18 DIAGNOSIS — M1990 Unspecified osteoarthritis, unspecified site: Secondary | ICD-10-CM

## 2019-03-18 DIAGNOSIS — G459 Transient cerebral ischemic attack, unspecified: Secondary | ICD-10-CM

## 2019-03-18 DIAGNOSIS — Z9119 Patient's noncompliance with other medical treatment and regimen: Secondary | ICD-10-CM

## 2019-03-18 DIAGNOSIS — M329 Systemic lupus erythematosus, unspecified: Secondary | ICD-10-CM

## 2019-03-18 DIAGNOSIS — Z0000 Encounter for general adult medical examination without abnormal findings: Secondary | ICD-10-CM

## 2019-03-18 DIAGNOSIS — I6350 Cerebral infarction due to unspecified occlusion or stenosis of unspecified cerebral artery: Secondary | ICD-10-CM

## 2019-03-18 DIAGNOSIS — R251 Tremor, unspecified: Secondary | ICD-10-CM

## 2019-03-18 DIAGNOSIS — I2699 Other pulmonary embolism without acute cor pulmonale: Secondary | ICD-10-CM

## 2019-03-18 DIAGNOSIS — E785 Hyperlipidemia, unspecified: Secondary | ICD-10-CM

## 2019-03-18 DIAGNOSIS — M3500 Sicca syndrome, unspecified: Secondary | ICD-10-CM

## 2019-03-18 DIAGNOSIS — IMO0002 Atrial fib/flutter, transient: Secondary | ICD-10-CM

## 2019-03-18 MED ORDER — LISINOPRIL 20 MG PO TABS
20 mg | Freq: Every day | ORAL | 6 refills | Status: CP
Start: 2019-03-18 — End: ?

## 2019-04-15 ENCOUNTER — Telehealth: Admit: 2019-04-15 | Discharge: 2019-04-15 | Attending: Acute Care | Primary: Internal Medicine

## 2019-04-15 DIAGNOSIS — Z79899 Other long term (current) drug therapy: Secondary | ICD-10-CM

## 2019-04-15 DIAGNOSIS — I10 Essential (primary) hypertension: Principal | ICD-10-CM

## 2019-04-15 DIAGNOSIS — J449 Chronic obstructive pulmonary disease, unspecified: Secondary | ICD-10-CM

## 2019-04-15 DIAGNOSIS — M329 Systemic lupus erythematosus, unspecified: Secondary | ICD-10-CM

## 2019-04-15 DIAGNOSIS — I2510 Atherosclerotic heart disease of native coronary artery without angina pectoris: Secondary | ICD-10-CM

## 2019-04-15 DIAGNOSIS — I6350 Cerebral infarction due to unspecified occlusion or stenosis of unspecified cerebral artery: Secondary | ICD-10-CM

## 2019-04-15 DIAGNOSIS — M3500 Sicca syndrome, unspecified: Secondary | ICD-10-CM

## 2019-04-15 DIAGNOSIS — Z9119 Patient's noncompliance with other medical treatment and regimen: Secondary | ICD-10-CM

## 2019-04-15 DIAGNOSIS — R251 Tremor, unspecified: Secondary | ICD-10-CM

## 2019-04-15 DIAGNOSIS — G459 Transient cerebral ischemic attack, unspecified: Secondary | ICD-10-CM

## 2019-04-15 DIAGNOSIS — I8290 Acute embolism and thrombosis of unspecified vein: Secondary | ICD-10-CM

## 2019-04-15 DIAGNOSIS — G43009 Migraine without aura, not intractable, without status migrainosus: Principal | ICD-10-CM

## 2019-04-15 DIAGNOSIS — I2699 Other pulmonary embolism without acute cor pulmonale: Secondary | ICD-10-CM

## 2019-04-15 DIAGNOSIS — IMO0002 Atrial fib/flutter, transient: Secondary | ICD-10-CM

## 2019-04-15 DIAGNOSIS — M1990 Unspecified osteoarthritis, unspecified site: Secondary | ICD-10-CM

## 2019-04-15 DIAGNOSIS — E785 Hyperlipidemia, unspecified: Secondary | ICD-10-CM

## 2019-04-15 MED ORDER — ERENUMAB-AOOE 140 MG/ML SC SOAJ
140 mg | SUBCUTANEOUS | 8 refills | Status: CP
Start: 2019-04-15 — End: ?

## 2019-05-19 DIAGNOSIS — M3219 Other organ or system involvement in systemic lupus erythematosus: Principal | ICD-10-CM

## 2019-05-19 DIAGNOSIS — M3500 Sicca syndrome, unspecified: Secondary | ICD-10-CM

## 2019-05-19 MED ORDER — HYDROXYCHLOROQUINE SULFATE 200 MG PO TABS
200 mg | Freq: Every day | ORAL | 0 refills | Status: CP
Start: 2019-05-19 — End: ?

## 2019-06-30 DIAGNOSIS — M170 Bilateral primary osteoarthritis of knee: Secondary | ICD-10-CM

## 2019-06-30 DIAGNOSIS — I10 Essential (primary) hypertension: Secondary | ICD-10-CM

## 2019-06-30 DIAGNOSIS — M1710 Unilateral primary osteoarthritis, unspecified knee: Principal | ICD-10-CM

## 2019-06-30 MED ORDER — DICLOFENAC SODIUM 1 % TD GEL
TOPICAL | 3 refills | 13.00000 days | Status: CP
Start: 2019-06-30 — End: ?

## 2019-06-30 MED ORDER — DILTIAZEM HCL ER 180 MG PO CP24
ORAL | 0 refills | 30.00000 days | Status: CP
Start: 2019-06-30 — End: ?

## 2019-07-05 DIAGNOSIS — G894 Chronic pain syndrome: Principal | ICD-10-CM

## 2019-07-05 MED ORDER — GABAPENTIN 600 MG PO TABS
0 refills | Status: CP
Start: 2019-07-05 — End: ?

## 2019-10-01 DIAGNOSIS — I10 Essential (primary) hypertension: Principal | ICD-10-CM

## 2019-10-01 DIAGNOSIS — M3501 Sicca syndrome with keratoconjunctivitis: Principal | ICD-10-CM

## 2019-10-02 DIAGNOSIS — M3501 Sicca syndrome with keratoconjunctivitis: Principal | ICD-10-CM

## 2019-10-04 MED ORDER — RESTASIS 0.05 % OP EMUL
OPHTHALMIC | 0 refills | 15.00000 days | Status: CP
Start: 2019-10-04 — End: ?

## 2019-10-04 MED ORDER — DILTIAZEM HCL ER 180 MG PO CP24
ORAL | 0 refills | 30.00000 days | Status: CP
Start: 2019-10-04 — End: ?

## 2019-10-04 MED ORDER — RESTASIS 0.05 % OP EMUL
OPHTHALMIC | 6 refills | 15.00000 days | Status: CP
Start: 2019-10-04 — End: ?

## 2019-10-04 MED ORDER — LISINOPRIL 20 MG PO TABS
20 mg | Freq: Every day | ORAL | 6 refills | Status: CP
Start: 2019-10-04 — End: ?

## 2019-11-05 DIAGNOSIS — I10 Essential (primary) hypertension: Principal | ICD-10-CM

## 2019-11-05 MED ORDER — LISINOPRIL 20 MG PO TABS
20 mg | Freq: Every day | ORAL | 1 refills | Status: CP
Start: 2019-11-05 — End: ?

## 2019-11-09 ENCOUNTER — Telehealth: Attending: Internal Medicine | Primary: Internal Medicine

## 2019-11-09 DIAGNOSIS — I8290 Acute embolism and thrombosis of unspecified vein: Secondary | ICD-10-CM

## 2019-11-09 DIAGNOSIS — M329 Systemic lupus erythematosus, unspecified: Secondary | ICD-10-CM

## 2019-11-09 DIAGNOSIS — Z9119 Patient's noncompliance with other medical treatment and regimen: Secondary | ICD-10-CM

## 2019-11-09 DIAGNOSIS — I10 Essential (primary) hypertension: Principal | ICD-10-CM

## 2019-11-09 DIAGNOSIS — I6350 Cerebral infarction due to unspecified occlusion or stenosis of unspecified cerebral artery: Secondary | ICD-10-CM

## 2019-11-09 DIAGNOSIS — I2510 Atherosclerotic heart disease of native coronary artery without angina pectoris: Secondary | ICD-10-CM

## 2019-11-09 DIAGNOSIS — G459 Transient cerebral ischemic attack, unspecified: Secondary | ICD-10-CM

## 2019-11-09 DIAGNOSIS — M1990 Unspecified osteoarthritis, unspecified site: Secondary | ICD-10-CM

## 2019-11-09 DIAGNOSIS — I2699 Other pulmonary embolism without acute cor pulmonale: Secondary | ICD-10-CM

## 2019-11-09 DIAGNOSIS — M3500 Sicca syndrome, unspecified: Secondary | ICD-10-CM

## 2019-11-09 DIAGNOSIS — E785 Hyperlipidemia, unspecified: Secondary | ICD-10-CM

## 2019-11-09 DIAGNOSIS — R251 Tremor, unspecified: Secondary | ICD-10-CM

## 2019-11-09 DIAGNOSIS — IMO0002 Atrial fib/flutter, transient: Secondary | ICD-10-CM

## 2019-11-09 DIAGNOSIS — R609 Edema, unspecified: Secondary | ICD-10-CM

## 2019-11-09 DIAGNOSIS — J449 Chronic obstructive pulmonary disease, unspecified: Secondary | ICD-10-CM

## 2019-11-09 MED ORDER — HYDROCHLOROTHIAZIDE 25 MG PO TABS
1 refills | Status: CP
Start: 2019-11-09 — End: ?

## 2019-11-09 MED ORDER — LISINOPRIL 20 MG PO TABS
20 mg | Freq: Every day | ORAL | 1 refills | Status: CP
Start: 2019-11-09 — End: ?

## 2019-12-20 DIAGNOSIS — M62838 Other muscle spasm: Principal | ICD-10-CM

## 2019-12-20 DIAGNOSIS — I10 Essential (primary) hypertension: Principal | ICD-10-CM

## 2019-12-20 MED ORDER — DILTIAZEM HCL ER 180 MG PO CP24
ORAL | 0 refills | 30.00000 days | Status: CP
Start: 2019-12-20 — End: ?

## 2019-12-21 MED ORDER — BACLOFEN 10 MG PO TABS
10 mg | Freq: Two times a day (BID) | ORAL | 0 refills | Status: CP
Start: 2019-12-21 — End: ?

## 2020-01-17 ENCOUNTER — Encounter: Attending: Internal Medicine | Primary: Internal Medicine

## 2020-01-24 DIAGNOSIS — G43009 Migraine without aura, not intractable, without status migrainosus: Principal | ICD-10-CM

## 2020-01-26 DIAGNOSIS — M3219 Other organ or system involvement in systemic lupus erythematosus: Secondary | ICD-10-CM

## 2020-01-26 DIAGNOSIS — M3500 Sicca syndrome, unspecified: Secondary | ICD-10-CM

## 2020-01-26 DIAGNOSIS — G43009 Migraine without aura, not intractable, without status migrainosus: Principal | ICD-10-CM

## 2020-01-26 MED ORDER — ERENUMAB-AOOE 140 MG/ML SC SOAJ
140 mg | SUBCUTANEOUS | 8 refills | 30.00000 days | Status: CP
Start: 2020-01-26 — End: ?

## 2020-01-26 MED ORDER — HYDROXYCHLOROQUINE SULFATE 200 MG PO TABS
200 mg | Freq: Every day | ORAL | 0 refills
Start: 2020-01-26 — End: ?

## 2020-01-27 ENCOUNTER — Telehealth: Payer: Medicare Other | Attending: Internal Medicine | Primary: Internal Medicine

## 2020-01-27 DIAGNOSIS — M3500 Sicca syndrome, unspecified: Secondary | ICD-10-CM

## 2020-01-27 DIAGNOSIS — I6350 Cerebral infarction due to unspecified occlusion or stenosis of unspecified cerebral artery: Secondary | ICD-10-CM

## 2020-01-27 DIAGNOSIS — M1990 Unspecified osteoarthritis, unspecified site: Secondary | ICD-10-CM

## 2020-01-27 DIAGNOSIS — E785 Hyperlipidemia, unspecified: Secondary | ICD-10-CM

## 2020-01-27 DIAGNOSIS — I2510 Atherosclerotic heart disease of native coronary artery without angina pectoris: Secondary | ICD-10-CM

## 2020-01-27 DIAGNOSIS — J449 Chronic obstructive pulmonary disease, unspecified: Secondary | ICD-10-CM

## 2020-01-27 DIAGNOSIS — I8290 Acute embolism and thrombosis of unspecified vein: Secondary | ICD-10-CM

## 2020-01-27 DIAGNOSIS — I10 Essential (primary) hypertension: Principal | ICD-10-CM

## 2020-01-27 DIAGNOSIS — G459 Transient cerebral ischemic attack, unspecified: Secondary | ICD-10-CM

## 2020-01-27 DIAGNOSIS — R251 Tremor, unspecified: Secondary | ICD-10-CM

## 2020-01-27 DIAGNOSIS — I2699 Other pulmonary embolism without acute cor pulmonale: Secondary | ICD-10-CM

## 2020-01-27 DIAGNOSIS — Z9119 Patient's noncompliance with other medical treatment and regimen: Secondary | ICD-10-CM

## 2020-01-27 DIAGNOSIS — M3219 Other organ or system involvement in systemic lupus erythematosus: Principal | ICD-10-CM

## 2020-01-27 DIAGNOSIS — Z79899 Other long term (current) drug therapy: Secondary | ICD-10-CM

## 2020-01-27 DIAGNOSIS — IMO0002 Atrial fib/flutter, transient: Secondary | ICD-10-CM

## 2020-01-27 DIAGNOSIS — M797 Fibromyalgia: Secondary | ICD-10-CM

## 2020-01-27 DIAGNOSIS — M329 Systemic lupus erythematosus, unspecified: Secondary | ICD-10-CM

## 2020-01-27 MED ORDER — PILOCARPINE HCL 5 MG PO TABS
5 mg | Freq: Two times a day (BID) | ORAL | 3 refills | Status: CP
Start: 2020-01-27 — End: ?

## 2020-01-28 DIAGNOSIS — M3500 Sicca syndrome, unspecified: Principal | ICD-10-CM

## 2020-02-27 DIAGNOSIS — F323 Major depressive disorder, single episode, severe with psychotic features: Principal | ICD-10-CM

## 2020-02-28 DIAGNOSIS — F323 Major depressive disorder, single episode, severe with psychotic features: Principal | ICD-10-CM

## 2020-02-28 MED ORDER — SERTRALINE HCL 50 MG PO TABS
0 refills
Start: 2020-02-28 — End: ?

## 2020-03-06 DIAGNOSIS — F323 Major depressive disorder, single episode, severe with psychotic features: Principal | ICD-10-CM

## 2020-03-06 MED ORDER — SERTRALINE HCL 50 MG PO TABS
0 refills | Status: CP
Start: 2020-03-06 — End: ?

## 2020-03-19 DIAGNOSIS — I10 Essential (primary) hypertension: Principal | ICD-10-CM

## 2020-03-20 MED ORDER — DILTIAZEM HCL ER 180 MG PO CP24
0 refills
Start: 2020-03-20 — End: ?

## 2020-04-23 DIAGNOSIS — I10 Essential (primary) hypertension: Secondary | ICD-10-CM

## 2020-04-23 DIAGNOSIS — R609 Edema, unspecified: Secondary | ICD-10-CM

## 2020-04-23 DIAGNOSIS — F323 Major depressive disorder, single episode, severe with psychotic features: Principal | ICD-10-CM

## 2020-04-24 MED ORDER — HYDROCHLOROTHIAZIDE 25 MG PO TABS
1 refills
Start: 2020-04-24 — End: ?

## 2020-04-24 MED ORDER — DILTIAZEM HCL ER 180 MG PO CP24
0 refills
Start: 2020-04-24 — End: ?

## 2020-04-24 MED ORDER — SERTRALINE HCL 50 MG PO TABS
0 refills
Start: 2020-04-24 — End: ?

## 2020-05-02 DIAGNOSIS — M170 Bilateral primary osteoarthritis of knee: Secondary | ICD-10-CM

## 2020-05-02 DIAGNOSIS — Z9119 Patient's noncompliance with other medical treatment and regimen: Secondary | ICD-10-CM

## 2020-05-02 DIAGNOSIS — E785 Hyperlipidemia, unspecified: Secondary | ICD-10-CM

## 2020-05-02 DIAGNOSIS — I10 Essential (primary) hypertension: Principal | ICD-10-CM

## 2020-05-02 DIAGNOSIS — G459 Transient cerebral ischemic attack, unspecified: Secondary | ICD-10-CM

## 2020-05-02 DIAGNOSIS — J449 Chronic obstructive pulmonary disease, unspecified: Secondary | ICD-10-CM

## 2020-05-02 DIAGNOSIS — M329 Systemic lupus erythematosus, unspecified: Secondary | ICD-10-CM

## 2020-05-02 DIAGNOSIS — IMO0002 Atrial fib/flutter, transient: Secondary | ICD-10-CM

## 2020-05-02 DIAGNOSIS — M1710 Unilateral primary osteoarthritis, unspecified knee: Secondary | ICD-10-CM

## 2020-05-02 DIAGNOSIS — M3500 Sicca syndrome, unspecified: Secondary | ICD-10-CM

## 2020-05-02 DIAGNOSIS — G894 Chronic pain syndrome: Secondary | ICD-10-CM

## 2020-05-02 DIAGNOSIS — I8290 Acute embolism and thrombosis of unspecified vein: Secondary | ICD-10-CM

## 2020-05-02 DIAGNOSIS — I2510 Atherosclerotic heart disease of native coronary artery without angina pectoris: Secondary | ICD-10-CM

## 2020-05-02 DIAGNOSIS — M1990 Unspecified osteoarthritis, unspecified site: Secondary | ICD-10-CM

## 2020-05-02 DIAGNOSIS — R251 Tremor, unspecified: Secondary | ICD-10-CM

## 2020-05-02 DIAGNOSIS — F323 Major depressive disorder, single episode, severe with psychotic features: Secondary | ICD-10-CM

## 2020-05-02 DIAGNOSIS — I2699 Other pulmonary embolism without acute cor pulmonale: Secondary | ICD-10-CM

## 2020-05-02 DIAGNOSIS — R609 Edema, unspecified: Secondary | ICD-10-CM

## 2020-05-02 DIAGNOSIS — I6350 Cerebral infarction due to unspecified occlusion or stenosis of unspecified cerebral artery: Secondary | ICD-10-CM

## 2020-05-02 MED ORDER — GABAPENTIN 600 MG PO TABS
600 mg | Freq: Three times a day (TID) | ORAL | 0 refills | Status: CP
Start: 2020-05-02 — End: ?

## 2020-05-02 MED ORDER — DICLOFENAC SODIUM 1 % EX GEL
TOPICAL | 3 refills | 13.00000 days | Status: CP
Start: 2020-05-02 — End: ?

## 2020-05-02 MED ORDER — SERTRALINE HCL 50 MG PO TABS
0 refills | Status: CP
Start: 2020-05-02 — End: ?

## 2020-05-02 MED ORDER — LISINOPRIL 20 MG PO TABS
20 mg | Freq: Every day | ORAL | 1 refills | Status: CP
Start: 2020-05-02 — End: ?

## 2020-05-02 MED ORDER — DILTIAZEM HCL ER 180 MG PO CP24
ORAL | 0 refills | 30.00000 days | Status: CP
Start: 2020-05-02 — End: ?

## 2020-05-02 MED ORDER — HYDROCHLOROTHIAZIDE 25 MG PO TABS
1 refills | Status: CP
Start: 2020-05-02 — End: ?

## 2020-06-14 ENCOUNTER — Ambulatory Visit: Admit: 2020-06-14 | Discharge: 2020-06-14 | Attending: Internal Medicine

## 2020-06-14 DIAGNOSIS — G43009 Migraine without aura, not intractable, without status migrainosus: Principal | ICD-10-CM

## 2020-06-14 MED ORDER — LASMIDITAN SUCCINATE 50 MG PO TABS
50 mg | ORAL | 5 refills | Status: CP | PRN
Start: 2020-06-14 — End: ?

## 2020-06-14 MED ORDER — LOSARTAN POTASSIUM 50 MG PO TABS
50 mg | Freq: Every day | ORAL
Start: 2020-06-14 — End: ?

## 2020-06-20 ENCOUNTER — Ambulatory Visit: Admit: 2020-06-20 | Discharge: 2020-06-20 | Attending: Optometrist

## 2020-06-20 ENCOUNTER — Ambulatory Visit: Admit: 2020-06-20 | Discharge: 2020-06-20 | Attending: Internal Medicine

## 2020-06-22 ENCOUNTER — Ambulatory Visit: Admit: 2020-06-22 | Discharge: 2020-06-22 | Attending: Internal Medicine

## 2020-06-22 ENCOUNTER — Ambulatory Visit: Admit: 2020-06-22 | Discharge: 2020-06-22 | Attending: Optometrist

## 2020-06-22 DIAGNOSIS — M329 Systemic lupus erythematosus, unspecified: Secondary | ICD-10-CM

## 2020-06-22 DIAGNOSIS — I10 Essential (primary) hypertension: Principal | ICD-10-CM

## 2020-06-22 DIAGNOSIS — J449 Chronic obstructive pulmonary disease, unspecified: Secondary | ICD-10-CM

## 2020-06-22 DIAGNOSIS — I6350 Cerebral infarction due to unspecified occlusion or stenosis of unspecified cerebral artery: Secondary | ICD-10-CM

## 2020-06-22 DIAGNOSIS — Z9119 Patient's noncompliance with other medical treatment and regimen: Secondary | ICD-10-CM

## 2020-06-22 DIAGNOSIS — IMO0002 Atrial fib/flutter, transient: Secondary | ICD-10-CM

## 2020-06-22 DIAGNOSIS — H2513 Age-related nuclear cataract, bilateral: Secondary | ICD-10-CM

## 2020-06-22 DIAGNOSIS — I2699 Other pulmonary embolism without acute cor pulmonale: Secondary | ICD-10-CM

## 2020-06-22 DIAGNOSIS — M1990 Unspecified osteoarthritis, unspecified site: Secondary | ICD-10-CM

## 2020-06-22 DIAGNOSIS — M3500 Sicca syndrome, unspecified: Secondary | ICD-10-CM

## 2020-06-22 DIAGNOSIS — R251 Tremor, unspecified: Secondary | ICD-10-CM

## 2020-06-22 DIAGNOSIS — G459 Transient cerebral ischemic attack, unspecified: Secondary | ICD-10-CM

## 2020-06-22 DIAGNOSIS — I8290 Acute embolism and thrombosis of unspecified vein: Secondary | ICD-10-CM

## 2020-06-22 DIAGNOSIS — Z79899 Other long term (current) drug therapy: Principal | ICD-10-CM

## 2020-06-22 DIAGNOSIS — T464X5A Adverse effect of angiotensin-converting-enzyme inhibitors, initial encounter: Secondary | ICD-10-CM

## 2020-06-22 DIAGNOSIS — I2510 Atherosclerotic heart disease of native coronary artery without angina pectoris: Secondary | ICD-10-CM

## 2020-06-22 DIAGNOSIS — R05 Cough: Secondary | ICD-10-CM

## 2020-06-22 DIAGNOSIS — Z1231 Encounter for screening mammogram for malignant neoplasm of breast: Secondary | ICD-10-CM

## 2020-06-22 DIAGNOSIS — E785 Hyperlipidemia, unspecified: Secondary | ICD-10-CM

## 2020-06-22 DIAGNOSIS — Z2821 Immunization not carried out because of patient refusal: Secondary | ICD-10-CM

## 2020-06-22 DIAGNOSIS — Z6835 Body mass index (BMI) 35.0-35.9, adult: Principal | ICD-10-CM

## 2020-06-22 MED ORDER — LOSARTAN POTASSIUM-HCTZ 100-25 MG PO TABS
1 | ORAL_TABLET | Freq: Every day | ORAL | 1 refills | Status: CP
Start: 2020-06-22 — End: ?

## 2020-06-23 ENCOUNTER — Ambulatory Visit: Admit: 2020-06-23 | Discharge: 2020-06-23

## 2020-06-26 ENCOUNTER — Ambulatory Visit: Admit: 2020-06-26 | Discharge: 2020-06-26 | Attending: Internal Medicine

## 2020-06-29 ENCOUNTER — Encounter: Payer: Medicare Other | Attending: Optometrist | Primary: Internal Medicine

## 2020-06-30 ENCOUNTER — Ambulatory Visit: Payer: Medicare Other | Primary: Internal Medicine

## 2020-06-30 DIAGNOSIS — Z79899 Other long term (current) drug therapy: Principal | ICD-10-CM

## 2020-07-10 DIAGNOSIS — IMO0002 Atrial fib/flutter, transient: Secondary | ICD-10-CM

## 2020-07-10 DIAGNOSIS — M797 Fibromyalgia: Secondary | ICD-10-CM

## 2020-07-10 DIAGNOSIS — Z9119 Patient's noncompliance with other medical treatment and regimen: Secondary | ICD-10-CM

## 2020-07-10 DIAGNOSIS — I2699 Other pulmonary embolism without acute cor pulmonale: Secondary | ICD-10-CM

## 2020-07-10 DIAGNOSIS — I2510 Atherosclerotic heart disease of native coronary artery without angina pectoris: Secondary | ICD-10-CM

## 2020-07-10 DIAGNOSIS — Z6834 Body mass index (BMI) 34.0-34.9, adult: Principal | ICD-10-CM

## 2020-07-10 DIAGNOSIS — M329 Systemic lupus erythematosus, unspecified: Secondary | ICD-10-CM

## 2020-07-10 DIAGNOSIS — I10 Essential (primary) hypertension: Principal | ICD-10-CM

## 2020-07-10 DIAGNOSIS — R251 Tremor, unspecified: Secondary | ICD-10-CM

## 2020-07-10 DIAGNOSIS — M3500 Sicca syndrome, unspecified: Secondary | ICD-10-CM

## 2020-07-10 DIAGNOSIS — M1990 Unspecified osteoarthritis, unspecified site: Secondary | ICD-10-CM

## 2020-07-10 DIAGNOSIS — I6350 Cerebral infarction due to unspecified occlusion or stenosis of unspecified cerebral artery: Secondary | ICD-10-CM

## 2020-07-10 DIAGNOSIS — I8290 Acute embolism and thrombosis of unspecified vein: Secondary | ICD-10-CM

## 2020-07-10 DIAGNOSIS — E785 Hyperlipidemia, unspecified: Secondary | ICD-10-CM

## 2020-07-10 DIAGNOSIS — J449 Chronic obstructive pulmonary disease, unspecified: Secondary | ICD-10-CM

## 2020-07-10 DIAGNOSIS — G459 Transient cerebral ischemic attack, unspecified: Secondary | ICD-10-CM

## 2020-07-10 DIAGNOSIS — R0789 Other chest pain: Secondary | ICD-10-CM

## 2020-07-20 ENCOUNTER — Inpatient Hospital Stay: Admit: 2020-07-20 | Discharge: 2020-07-21 | Payer: Medicare Other | Primary: Internal Medicine

## 2020-07-20 DIAGNOSIS — IMO0002 Atrial fib/flutter, transient: Secondary | ICD-10-CM

## 2020-07-20 DIAGNOSIS — M797 Fibromyalgia: Principal | ICD-10-CM

## 2020-07-20 DIAGNOSIS — M3500 Sicca syndrome, unspecified: Secondary | ICD-10-CM

## 2020-07-20 DIAGNOSIS — M170 Bilateral primary osteoarthritis of knee: Secondary | ICD-10-CM

## 2020-07-20 DIAGNOSIS — M25562 Pain in left knee: Secondary | ICD-10-CM

## 2020-07-20 DIAGNOSIS — M1990 Unspecified osteoarthritis, unspecified site: Secondary | ICD-10-CM

## 2020-07-20 DIAGNOSIS — M25561 Pain in right knee: Principal | ICD-10-CM

## 2020-07-20 DIAGNOSIS — R251 Tremor, unspecified: Secondary | ICD-10-CM

## 2020-07-20 DIAGNOSIS — I8290 Acute embolism and thrombosis of unspecified vein: Secondary | ICD-10-CM

## 2020-07-20 DIAGNOSIS — I2510 Atherosclerotic heart disease of native coronary artery without angina pectoris: Secondary | ICD-10-CM

## 2020-07-20 DIAGNOSIS — M1710 Unilateral primary osteoarthritis, unspecified knee: Secondary | ICD-10-CM

## 2020-07-20 DIAGNOSIS — I6350 Cerebral infarction due to unspecified occlusion or stenosis of unspecified cerebral artery: Secondary | ICD-10-CM

## 2020-07-20 DIAGNOSIS — I2699 Other pulmonary embolism without acute cor pulmonale: Secondary | ICD-10-CM

## 2020-07-20 DIAGNOSIS — M329 Systemic lupus erythematosus, unspecified: Secondary | ICD-10-CM

## 2020-07-20 DIAGNOSIS — G8929 Other chronic pain: Secondary | ICD-10-CM

## 2020-07-20 DIAGNOSIS — J449 Chronic obstructive pulmonary disease, unspecified: Secondary | ICD-10-CM

## 2020-07-20 DIAGNOSIS — E785 Hyperlipidemia, unspecified: Secondary | ICD-10-CM

## 2020-07-20 DIAGNOSIS — I10 Essential (primary) hypertension: Principal | ICD-10-CM

## 2020-07-20 DIAGNOSIS — G459 Transient cerebral ischemic attack, unspecified: Secondary | ICD-10-CM

## 2020-07-20 DIAGNOSIS — Z9119 Patient's noncompliance with other medical treatment and regimen: Secondary | ICD-10-CM

## 2020-07-20 MED ORDER — PILOCARPINE HCL 5 MG PO TABS
10 mg | Freq: Three times a day (TID) | ORAL | 3 refills | Status: CP | PRN
Start: 2020-07-20 — End: ?

## 2020-07-20 MED ORDER — DICLOFENAC SODIUM 1 % EX GEL
TOPICAL | 3 refills | 13.00000 days | Status: CP
Start: 2020-07-20 — End: ?

## 2020-09-21 DIAGNOSIS — G894 Chronic pain syndrome: Principal | ICD-10-CM

## 2020-09-21 DIAGNOSIS — I10 Essential (primary) hypertension: Secondary | ICD-10-CM

## 2020-09-21 MED ORDER — DILTIAZEM HCL ER 180 MG PO CP24
ORAL | 0 refills | 30.00000 days | Status: CP
Start: 2020-09-21 — End: ?

## 2020-09-21 MED ORDER — GABAPENTIN 600 MG PO TABS
600 mg | Freq: Three times a day (TID) | ORAL | 0 refills | Status: CP
Start: 2020-09-21 — End: ?

## 2020-10-23 ENCOUNTER — Encounter: Payer: Medicare Other | Attending: Internal Medicine | Primary: Internal Medicine

## 2020-10-24 DIAGNOSIS — G43009 Migraine without aura, not intractable, without status migrainosus: Principal | ICD-10-CM

## 2020-10-25 MED ORDER — ERENUMAB-AOOE 140 MG/ML SC SOAJ
140 mg | SUBCUTANEOUS | 11 refills | 30.00000 days | Status: CP
Start: 2020-10-25 — End: ?

## 2020-12-14 DIAGNOSIS — F323 Major depressive disorder, single episode, severe with psychotic features: Principal | ICD-10-CM

## 2020-12-14 DIAGNOSIS — I10 Essential (primary) hypertension: Secondary | ICD-10-CM

## 2020-12-14 DIAGNOSIS — M170 Bilateral primary osteoarthritis of knee: Principal | ICD-10-CM

## 2020-12-14 DIAGNOSIS — G894 Chronic pain syndrome: Secondary | ICD-10-CM

## 2020-12-14 DIAGNOSIS — M3500 Sicca syndrome, unspecified: Secondary | ICD-10-CM

## 2020-12-14 MED ORDER — PILOCARPINE HCL 5 MG PO TABS
ORAL | 1 refills | 30.00 days | Status: CP
Start: 2020-12-14 — End: ?

## 2020-12-14 MED ORDER — DICLOFENAC SODIUM 1 % EX GEL
TOPICAL | 1 refills | 13.00000 days | Status: CP
Start: 2020-12-14 — End: ?

## 2020-12-14 MED ORDER — LOSARTAN POTASSIUM-HCTZ 100-25 MG PO TABS
1 | ORAL_TABLET | Freq: Every day | ORAL | 1 refills | Status: CP
Start: 2020-12-14 — End: ?

## 2020-12-18 MED ORDER — DILTIAZEM HCL ER 180 MG PO CP24
ORAL | 0 refills | 30.00000 days | Status: CP
Start: 2020-12-18 — End: ?

## 2020-12-18 MED ORDER — SERTRALINE HCL 50 MG PO TABS
0 refills | Status: CP
Start: 2020-12-18 — End: ?

## 2020-12-18 MED ORDER — GABAPENTIN 600 MG PO TABS
600 mg | Freq: Three times a day (TID) | ORAL | 0 refills | Status: CP
Start: 2020-12-18 — End: ?

## 2020-12-21 ENCOUNTER — Encounter: Attending: Internal Medicine | Primary: Internal Medicine

## 2021-01-16 ENCOUNTER — Encounter: Payer: Medicare Other | Attending: Internal Medicine | Primary: Internal Medicine

## 2021-01-16 ENCOUNTER — Ambulatory Visit: Payer: Medicare Other | Attending: Internal Medicine | Primary: Internal Medicine

## 2021-01-16 DIAGNOSIS — M3500 Sicca syndrome, unspecified: Secondary | ICD-10-CM

## 2021-01-16 DIAGNOSIS — M1990 Unspecified osteoarthritis, unspecified site: Secondary | ICD-10-CM

## 2021-01-16 DIAGNOSIS — M329 Systemic lupus erythematosus, unspecified: Secondary | ICD-10-CM

## 2021-01-16 DIAGNOSIS — I6350 Cerebral infarction due to unspecified occlusion or stenosis of unspecified cerebral artery: Secondary | ICD-10-CM

## 2021-01-16 DIAGNOSIS — E785 Hyperlipidemia, unspecified: Secondary | ICD-10-CM

## 2021-01-16 DIAGNOSIS — IMO0002 Atrial fib/flutter, transient: Secondary | ICD-10-CM

## 2021-01-16 DIAGNOSIS — I2510 Atherosclerotic heart disease of native coronary artery without angina pectoris: Secondary | ICD-10-CM

## 2021-01-16 DIAGNOSIS — M3219 Other organ or system involvement in systemic lupus erythematosus: Secondary | ICD-10-CM

## 2021-01-16 DIAGNOSIS — M170 Bilateral primary osteoarthritis of knee: Principal | ICD-10-CM

## 2021-01-16 DIAGNOSIS — I8290 Acute embolism and thrombosis of unspecified vein: Secondary | ICD-10-CM

## 2021-01-16 DIAGNOSIS — J449 Chronic obstructive pulmonary disease, unspecified: Secondary | ICD-10-CM

## 2021-01-16 DIAGNOSIS — Z79899 Other long term (current) drug therapy: Secondary | ICD-10-CM

## 2021-01-16 DIAGNOSIS — M797 Fibromyalgia: Secondary | ICD-10-CM

## 2021-01-16 DIAGNOSIS — I2699 Other pulmonary embolism without acute cor pulmonale: Secondary | ICD-10-CM

## 2021-01-16 DIAGNOSIS — Z9119 Patient's noncompliance with other medical treatment and regimen: Secondary | ICD-10-CM

## 2021-01-16 DIAGNOSIS — R251 Tremor, unspecified: Secondary | ICD-10-CM

## 2021-01-16 DIAGNOSIS — E559 Vitamin D deficiency, unspecified: Secondary | ICD-10-CM

## 2021-01-16 DIAGNOSIS — G459 Transient cerebral ischemic attack, unspecified: Secondary | ICD-10-CM

## 2021-01-16 DIAGNOSIS — I10 Essential (primary) hypertension: Principal | ICD-10-CM

## 2021-01-16 MED ORDER — CYCLOBENZAPRINE HCL 10 MG PO TABS
10 mg | Freq: Every evening | ORAL | 1 refills | Status: CP
Start: 2021-01-16 — End: ?

## 2021-01-25 ENCOUNTER — Encounter: Payer: Medicare Other | Attending: Internal Medicine | Primary: Internal Medicine

## 2021-01-30 MED ORDER — LOSARTAN POTASSIUM-HCTZ 100-25 MG PO TABS
1 | ORAL_TABLET | Freq: Every day | ORAL | 1 refills | Status: CP
Start: 2021-01-30 — End: ?

## 2021-01-30 MED ORDER — CYCLOBENZAPRINE HCL 10 MG PO TABS
10 mg | Freq: Every evening | ORAL | 1 refills | Status: CP
Start: 2021-01-30 — End: ?

## 2021-01-30 MED ORDER — RESTASIS 0.05 % OP EMUL
1 [drp] | Freq: Two times a day (BID) | OPHTHALMIC | 3 refills | 15.00000 days | Status: CP
Start: 2021-01-30 — End: ?

## 2021-02-12 ENCOUNTER — Emergency Department (HOSPITAL_BASED_OUTPATIENT_CLINIC_OR_DEPARTMENT_OTHER)
Admission: EM | Admit: 2021-02-12 | Discharge: 2021-02-12 | Disposition: A | Discharge status: Home / Self Care | Payer: 59 | Attending: Emergency Medicine | Admitting: Emergency Medicine

## 2021-02-12 ENCOUNTER — Encounter (HOSPITAL_BASED_OUTPATIENT_CLINIC_OR_DEPARTMENT_OTHER): Payer: Self-pay

## 2021-02-12 ENCOUNTER — Emergency Department (HOSPITAL_BASED_OUTPATIENT_CLINIC_OR_DEPARTMENT_OTHER): Payer: 59

## 2021-02-12 ENCOUNTER — Other Ambulatory Visit: Payer: Self-pay

## 2021-02-12 DIAGNOSIS — R079 Chest pain, unspecified: Secondary | ICD-10-CM | POA: Diagnosis present

## 2021-02-12 DIAGNOSIS — R0789 Other chest pain: Secondary | ICD-10-CM | POA: Diagnosis not present

## 2021-02-12 LAB — BASIC METABOLIC PANEL
Anion gap: 9 (ref 5–15)
BUN: 12 mg/dL (ref 6–20)
CO2: 28 mmol/L (ref 22–32)
Calcium: 9.4 mg/dL (ref 8.9–10.3)
Chloride: 101 mmol/L (ref 98–111)
Creatinine, Ser: 0.79 mg/dL (ref 0.44–1.00)
GFR, Estimated: >60 mL/min (ref >60)
Glucose, Bld: 91 mg/dL (ref 70–99)
Potassium: 3.4 mmol/L — ABNORMAL LOW (ref 3.5–5.1)
Sodium: 138 mmol/L (ref 135–145)

## 2021-02-12 LAB — CBC
HCT: 42.2 % (ref 36.0–46.0)
Hemoglobin: 13.4 g/dL (ref 12.0–15.0)
MCH: 27.6 pg (ref 26.0–34.0)
MCHC: 31.8 g/dL (ref 30.0–36.0)
MCV: 87 fL (ref 80.0–100.0)
Platelets: 215 10*3/uL (ref 150–400)
RBC: 4.85 MIL/uL (ref 3.87–5.11)
RDW: 13.1 % (ref 11.5–15.5)
WBC: 3.5 10*3/uL — ABNORMAL LOW (ref 4.0–10.5)
nRBC: 0 % (ref 0.0–0.2)

## 2021-02-12 LAB — TROPONIN I (HIGH SENSITIVITY)
Troponin I (High Sensitivity): 3 ng/L (ref <18)
Troponin I (High Sensitivity): 4 ng/L (ref <18)

## 2021-02-12 LAB — PREGNANCY, URINE: Preg Test, Ur: NEGATIVE

## 2021-02-12 MED ORDER — HYDROCODONE-ACETAMINOPHEN 5-325 MG PO TABS: 1.0000 | ORAL_TABLET | ORAL | 0 refills | Status: DC | PRN

## 2021-02-12 MED ORDER — MORPHINE SULFATE (PF) 4 MG/ML IV SOLN
4.0000 mg | Freq: Once | INTRAVENOUS | Status: AC
  Administered 2021-02-12: 4 mg via INTRAVENOUS
  Filled 2021-02-12: qty 1

## 2021-02-12 MED ORDER — IOHEXOL 350 MG/ML SOLN
100.0000 mL | Freq: Once | INTRAVENOUS | Status: AC | PRN
  Administered 2021-02-12: 100 mL via INTRAVENOUS

## 2021-02-12 MED ORDER — ALUM & MAG HYDROXIDE-SIMETH 200-200-20 MG/5ML PO SUSP
30.0000 mL | Freq: Once | ORAL | Status: AC
  Administered 2021-02-12: 30 mL via ORAL
  Filled 2021-02-12: qty 30

## 2021-02-12 MED ORDER — ONDANSETRON HCL 4 MG/2ML IJ SOLN
4.0000 mg | Freq: Once | INTRAMUSCULAR | Status: AC
  Administered 2021-02-12: 4 mg via INTRAVENOUS
  Filled 2021-02-12: qty 2

## 2021-02-12 MED ORDER — KETOROLAC TROMETHAMINE 30 MG/ML IJ SOLN
30.0000 mg | Freq: Once | INTRAMUSCULAR | Status: AC
  Administered 2021-02-12: 30 mg via INTRAVENOUS
  Filled 2021-02-12: qty 1

## 2021-02-12 NOTE — ED Provider Notes (Signed)
Ultrasound ED Peripheral IV (Provider)  Date/Time: 02/12/2021 5:01 PM Performed by: [name], PA Authorized by: [name], PA   Procedure details:    Indications: multiple failed IV attempts     Skin Prep: chlorhexidine gluconate     Location:  Right AC   Angiocath:  20 G   Bedside Ultrasound Guided: Yes     Images: not archived     Patient tolerated procedure without complications: Yes     Dressing applied: Yes        [name], PA 02/12/21 [place], Julie, MD 02/12/21 [phone]

## 2021-02-12 NOTE — ED Triage Notes (Signed)
Pt arrives with c/o pain to left chest reports history of PE in 2008 and history of TIA, HTN, and Lupus. Pt states that CP started this morning reports tingling in both hands and pain with deep breathing.

## 2021-02-12 NOTE — ED Notes (Signed)
Attempted to draw labs, unable to obtain, pt reports she usually gets her IVs either ultrasound or in her neck.

## 2021-02-12 NOTE — ED Notes (Signed)
Provider at bedside to attempt u/s guided PIV and lab draw

## 2021-02-12 NOTE — ED Notes (Signed)
Pt reports drilling sensation on the left side of her chest goes through to the exact place in her back that started this morning. Pt reports she just didn't feel right.  Pt reports pain with deep breath. Pain in the left leg started Saturday. Propped her legs up with relieved symptoms

## 2021-02-12 NOTE — ED Provider Notes (Signed)
[facility] EMERGENCY DEPARTMENT Provider Note   CSN: [id number] Arrival date & time: 02/12/21  1530     History Chief Complaint  Patient presents with  . Chest Pain    Michelle Nash is a 55 y.o. female.  Pt presents to the ED today with cp.  Pt said it started this morning after taking meds.  The pt said it is in the center of her chest and radiates straight back to her back.  The pt said it hurts to move.  She had some pain in the left leg a few days ago, but no pain there now.  Pt does have a hx of PE, but is not on blood thinners because they cause her to "bleed out."        Past Medical History:  Diagnosis Date  . Coronary artery spasm ([place])   . Fibromyalgia   . Lupus ([place])   . Osteoarthritis   . Pulmonary emboli ([place])   . Sjogren's disease ([place])     There are no problems to display for this patient.   Past Surgical History:  Procedure Laterality Date  . CESAREAN SECTION    . CHOLECYSTECTOMY       OB History    Gravida  4   Para  3   Term  3   Preterm      AB  1   Living        SAB  1   IAB      Ectopic      Multiple      Live Births              No family history on file.  Social History   Tobacco Use  . Smoking status: Never Smoker  . Smokeless tobacco: Never Used  Substance Use Topics  . Alcohol use: Never  . Drug use: Never    Home Medications Prior to Admission medications   Medication Sig Start Date End Date Taking? Authorizing Provider  cyclobenzaprine (FLEXERIL) 10 MG tablet Take 1 tablet by mouth at bedtime. 01/30/21  Yes [provider]  cycloSPORINE (RESTASIS) 0.05 % ophthalmic emulsion Apply to eye. 12/20/19  Yes [provider]  Erenumab-aooe (AIMOVIG) 140 MG/ML SOAJ every 30 (thirty) days. 12/20/19  Yes [provider]  HYDROcodone-acetaminophen (NORCO/VICODIN) 5-325 MG tablet Take 1 tablet by mouth every 4 (four) hours as needed. 02/12/21  Yes Isla Pence, MD   losartan-hydrochlorothiazide (HYZAAR) 100-25 MG tablet Take 1 tablet by mouth daily. 01/30/21  Yes [provider]  diltiazem (CARDIZEM) 90 MG tablet Take 90 mg by mouth 4 (four) times daily.    [provider]  gabapentin (NEURONTIN) 600 MG tablet Take 600 mg by mouth 3 (three) times daily.    [provider]  sertraline (ZOLOFT) 100 MG tablet Take 100 mg by mouth daily.    [provider]    Allergies    Lisinopril  Review of Systems   Review of Systems  Cardiovascular: Positive for chest pain.  All other systems reviewed and are negative.   Physical Exam Updated Vital Signs BP 113/81   Pulse 65   Temp 98.5 F (36.9 C) (Oral)   Resp 19   Ht 5\' 7"  (1.702 m)   Wt 100.7 kg   SpO2 99%   BMI 34.77 kg/m   Physical Exam Vitals and nursing note reviewed.  Constitutional:      Appearance: She is well-developed. She is obese.  HENT:  Head: Normocephalic and atraumatic.  Eyes:     Extraocular Movements: Extraocular movements intact.     Pupils: Pupils are equal, round, and reactive to light.  Cardiovascular:     Rate and Rhythm: Normal rate and regular rhythm.     Heart sounds: Normal heart sounds.  Pulmonary:     Effort: Pulmonary effort is normal.     Breath sounds: Normal breath sounds.  Chest:    Abdominal:     General: Bowel sounds are normal.     Palpations: Abdomen is soft.  Musculoskeletal:        General: Normal range of motion.     Cervical back: Normal range of motion and neck supple.  Skin:    General: Skin is warm and dry.     Capillary Refill: Capillary refill takes less than 2 seconds.  Neurological:     General: No focal deficit present.     Mental Status: She is alert and oriented to person, place, and time.     ED Results / Procedures / Treatments   Labs (all labs ordered are listed, but only abnormal results are displayed) Labs Reviewed  BASIC METABOLIC PANEL - Abnormal; Notable for the following  components:      Result Value   Potassium 3.4 (*)    All other components within normal limits  CBC - Abnormal; Notable for the following components:   WBC 3.5 (*)    All other components within normal limits  PREGNANCY, URINE  TROPONIN I (HIGH SENSITIVITY)  TROPONIN I (HIGH SENSITIVITY)    EKG EKG Interpretation  Date/Time:  Monday February 12 2021 15:41:33 EST Ventricular Rate:  78 PR Interval:  156 QRS Duration: 88 QT Interval:  390 QTC Calculation: 444 R Axis:   31 Text Interpretation: Normal sinus rhythm T wave abnormality, consider anterior ischemia Abnormal ECG No old tracing to compare Confirmed by Isla Pence [phone]) on 02/12/2021 4:26:46 PM   Radiology DG Chest 2 View  Result Date: 02/12/2021 CLINICAL DATA:  LEFT chest pain beginning this morning, history pulmonary embolism, hypertension, lupus EXAM: CHEST - 2 VIEW COMPARISON:  None FINDINGS: Normal heart size, mediastinal contours, and pulmonary vascularity. Lungs clear. No pleural effusion or pneumothorax. Bones unremarkable. IMPRESSION: Normal exam. Electronically Signed   By: Lavonia Dana M.D.   On: 02/12/2021 16:09   CT Angio Chest PE W and/or Wo Contrast  Result Date: 02/12/2021 CLINICAL DATA:  Left chest pain, personal history of pulmonary embolus, pain with inspiration EXAM: CT ANGIOGRAPHY CHEST WITH CONTRAST TECHNIQUE: Multidetector CT imaging of the chest was performed using the standard protocol during bolus administration of intravenous contrast. Multiplanar CT image reconstructions and MIPs were obtained to evaluate the vascular anatomy. CONTRAST:  176mL OMNIPAQUE IOHEXOL 350 MG/ML SOLN COMPARISON:  02/12/2021 FINDINGS: Cardiovascular: This is a technically adequate evaluation of the pulmonary vasculature. No filling defects or pulmonary emboli. The heart is unremarkable without pericardial effusion. No evidence of thoracic aortic aneurysm or dissection. Mediastinum/Nodes: No enlarged mediastinal, hilar, or  axillary lymph nodes. Thyroid gland, trachea, and esophagus demonstrate no significant findings. Lungs/Pleura: Hypoventilatory changes at the lung bases. No airspace disease, effusion, or pneumothorax. Central airways are patent. Upper Abdomen: No acute abnormality. Musculoskeletal: No acute or destructive bony lesions. Reconstructed images demonstrate no additional findings. Review of the MIP images confirms the above findings. IMPRESSION: 1. No evidence of pulmonary embolus. 2. No acute intrathoracic process. Electronically Signed   By: Randa Ngo M.D.   On: 02/12/2021 18:32  Procedures Procedures   Medications Ordered in ED Medications  morphine 4 MG/ML injection 4 mg (has no administration in time range)  ondansetron (ZOFRAN) injection 4 mg (has no administration in time range)  alum & mag hydroxide-simeth (MAALOX/MYLANTA) 200-200-20 MG/5ML suspension 30 mL (30 mLs Oral Given 02/12/21 1724)  iohexol (OMNIPAQUE) 350 MG/ML injection 100 mL (100 mLs Intravenous Contrast Given 02/12/21 1813)  ketorolac (TORADOL) 30 MG/ML injection 30 mg (30 mg Intravenous Given 02/12/21 1848)    ED Course  I have reviewed the triage vital signs and the nursing notes.  Pertinent labs & imaging results that were available during my care of the patient were reviewed by me and considered in my medical decision making (see chart for details).    MDM Rules/Calculators/A&P                          CTA neg for PE.  2 troponins neg.  Pt's pain seems to be more msk in nature.  Pt is stable for d/c.  Return if worse.   F/u with pcp.  Final Clinical Impression(s) / ED Diagnoses Final diagnoses:  Atypical chest pain    Rx / DC Orders ED Discharge Orders         Ordered    HYDROcodone-acetaminophen (NORCO/VICODIN) 5-325 MG tablet  Every 4 hours PRN        02/12/21 1946           Isla Pence, MD 02/12/21 1947

## 2021-03-23 DIAGNOSIS — G43009 Migraine without aura, not intractable, without status migrainosus: Principal | ICD-10-CM

## 2021-03-23 MED ORDER — ERENUMAB-AOOE 140 MG/ML SC SOAJ
140 mg | SUBCUTANEOUS | 11 refills | 30.00000 days | Status: CP
Start: 2021-03-23 — End: ?

## 2021-03-27 DIAGNOSIS — Z79899 Other long term (current) drug therapy: Principal | ICD-10-CM

## 2021-03-27 MED ORDER — ERGOCALCIFEROL 1.25 MG (50000 UT) PO CAPS
1.25 mg | ORAL | 0 refills | 28.00000 days | Status: CP
Start: 2021-03-27 — End: ?

## 2021-04-12 ENCOUNTER — Encounter (HOSPITAL_BASED_OUTPATIENT_CLINIC_OR_DEPARTMENT_OTHER): Payer: Self-pay | Admitting: *Deleted

## 2021-04-12 ENCOUNTER — Emergency Department (HOSPITAL_BASED_OUTPATIENT_CLINIC_OR_DEPARTMENT_OTHER): Payer: 59

## 2021-04-12 ENCOUNTER — Emergency Department (HOSPITAL_BASED_OUTPATIENT_CLINIC_OR_DEPARTMENT_OTHER)
Admission: EM | Admit: 2021-04-12 | Discharge: 2021-04-12 | Disposition: A | Discharge status: Home / Self Care | Payer: 59 | Attending: Emergency Medicine | Admitting: Emergency Medicine

## 2021-04-12 ENCOUNTER — Other Ambulatory Visit: Payer: Self-pay

## 2021-04-12 DIAGNOSIS — M25461 Effusion, right knee: Secondary | ICD-10-CM | POA: Diagnosis not present

## 2021-04-12 DIAGNOSIS — M25561 Pain in right knee: Secondary | ICD-10-CM | POA: Diagnosis present

## 2021-04-12 DIAGNOSIS — R079 Chest pain, unspecified: Secondary | ICD-10-CM | POA: Diagnosis not present

## 2021-04-12 DIAGNOSIS — M1711 Unilateral primary osteoarthritis, right knee: Secondary | ICD-10-CM | POA: Insufficient documentation

## 2021-04-12 LAB — BASIC METABOLIC PANEL
Anion gap: 10 (ref 5–15)
BUN: 11 mg/dL (ref 6–20)
CO2: 27 mmol/L (ref 22–32)
Calcium: 9.5 mg/dL (ref 8.9–10.3)
Chloride: 98 mmol/L (ref 98–111)
Creatinine, Ser: 0.82 mg/dL (ref 0.44–1.00)
GFR, Estimated: >60 mL/min (ref >60)
Glucose, Bld: 87 mg/dL (ref 70–99)
Potassium: 3.4 mmol/L — ABNORMAL LOW (ref 3.5–5.1)
Sodium: 135 mmol/L (ref 135–145)

## 2021-04-12 LAB — CBC WITH DIFFERENTIAL/PLATELET
Abs Immature Granulocytes: 0.01 10*3/uL (ref 0.00–0.07)
Basophils Absolute: 0 10*3/uL (ref 0.0–0.1)
Basophils Relative: 0 %
Eosinophils Absolute: 0.1 10*3/uL (ref 0.0–0.5)
Eosinophils Relative: 2 %
HCT: 39.9 % (ref 36.0–46.0)
Hemoglobin: 12.6 g/dL (ref 12.0–15.0)
Immature Granulocytes: 0 %
Lymphocytes Relative: 55 %
Lymphs Abs: 1.7 10*3/uL (ref 0.7–4.0)
MCH: 27.7 pg (ref 26.0–34.0)
MCHC: 31.6 g/dL (ref 30.0–36.0)
MCV: 87.7 fL (ref 80.0–100.0)
Monocytes Absolute: 0.3 10*3/uL (ref 0.1–1.0)
Monocytes Relative: 8 %
Neutro Abs: 1.1 10*3/uL — ABNORMAL LOW (ref 1.7–7.7)
Neutrophils Relative %: 35 %
Platelets: 240 10*3/uL (ref 150–400)
RBC: 4.55 MIL/uL (ref 3.87–5.11)
RDW: 12.9 % (ref 11.5–15.5)
WBC: 3.1 10*3/uL — ABNORMAL LOW (ref 4.0–10.5)
nRBC: 0 % (ref 0.0–0.2)

## 2021-04-12 LAB — TROPONIN I (HIGH SENSITIVITY)
Troponin I (High Sensitivity): 5 ng/L (ref <18)
Troponin I (High Sensitivity): 3 ng/L (ref <18)

## 2021-04-12 LAB — D-DIMER, QUANTITATIVE: D-Dimer, Quant: 0.84 ug/mL-FEU — ABNORMAL HIGH (ref 0.00–0.50)

## 2021-04-12 MED ORDER — IOHEXOL 350 MG/ML SOLN
100.0000 mL | Freq: Once | INTRAVENOUS | Status: AC | PRN
  Administered 2021-04-12: 100 mL via INTRAVENOUS

## 2021-04-12 NOTE — ED Provider Notes (Signed)
[facility] EMERGENCY DEPARTMENT Provider Note   CSN: [id number] Arrival date & time: 04/12/21  1323     History Chief Complaint  Patient presents with  . Knee Pain    Michelle Nash is a 55 y.o. female who presents to the ED today with complaint of gradual onset, constant, achy/throbbing, right knee pain and swelling x 2 weeks.  She reports she woke up with her knee slightly swollen however did not think much of it until about a week ago.  She states that she has difficulty bending it due to the [place] swelling.  She does endorse that she is about to go on a trip and her daughters were concerned that she could have a blood clot and did not want to exacerbate it and decided that she needs to come to the ED for further evaluation.  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  She believes her PE was brought on by having her gallbladder taken out.  She also believes that her DVT was brought on by a femoral catheterization.  Patient denies any recent prolonged travel or immobilization.  No recent trauma to the leg.  No recent surgeries.  She is not on OCPs.  She is not currently anticoagulated.  She also has a history of lupus.  No active malignancy.   Mentions that she has had intermittent chest pain.  She states she felt like her heart was palpitating a couple of days ago however she attributed to her knee pain.  No active chest pain currently.  No shortness of breath.  The history is provided by the patient and medical records.       Past Medical History:  Diagnosis Date  . Coronary artery spasm ([place])   . Fibromyalgia   . Lupus ([place])   . Osteoarthritis   . Pulmonary emboli ([place])   . Sjogren's disease ([place])     There are no problems to display for this patient.   Past Surgical History:  Procedure Laterality Date  . CESAREAN SECTION    . CHOLECYSTECTOMY       OB History    Gravida  4   Para  3   Term  3   Preterm      AB  1   Living         SAB  1   IAB      Ectopic      Multiple      Live Births              No family history on file.  Social History   Tobacco Use  . Smoking status: Never Smoker  . Smokeless tobacco: Never Used  Substance Use Topics  . Alcohol use: Never  . Drug use: Never    Home Medications Prior to Admission medications   Medication Sig Start Date End Date Taking? Authorizing Provider  cholecalciferol (VITAMIN D) 25 MCG (1000 UNIT) tablet 50,000 units once a week   Yes [provider]  cyclobenzaprine (FLEXERIL) 10 MG tablet Take 1 tablet by mouth at bedtime. 01/30/21  Yes [provider]  diltiazem (CARDIZEM) 90 MG tablet Take 90 mg by mouth 4 (four) times daily.   Yes [provider]  Erenumab-aooe (AIMOVIG) 140 MG/ML SOAJ every 30 (thirty) days. 12/20/19  Yes [provider]  gabapentin (NEURONTIN) 600 MG tablet Take 600 mg by mouth 3 (three) times daily.   Yes [provider]  losartan-hydrochlorothiazide (  HYZAAR) 100-25 MG tablet Take 1 tablet by mouth daily. 01/30/21  Yes [provider]  sertraline (ZOLOFT) 100 MG tablet Take 100 mg by mouth daily.   Yes [provider]  cycloSPORINE (RESTASIS) 0.05 % ophthalmic emulsion Apply to eye. 12/20/19   [provider]  HYDROcodone-acetaminophen (NORCO/VICODIN) 5-325 MG tablet Take 1 tablet by mouth every 4 (four) hours as needed. 02/12/21   Isla Pence, MD    Allergies    Lisinopril  Review of Systems   Review of Systems  Constitutional: Negative for chills and fever.  Respiratory: Negative for shortness of breath.   Cardiovascular: Positive for palpitations and leg swelling.  Musculoskeletal: Positive for arthralgias and joint swelling.  Skin: Negative for color change.  All other systems reviewed and are negative.   Physical Exam Updated Vital Signs BP (!) 132/91 (BP Location: Right Arm)   Pulse 81   Temp 98.6 F (37 C) (Oral)   Resp 18   Ht 5\' 7"   (1.702 m)   Wt 103.4 kg   SpO2 100%   BMI 35.69 kg/m   Physical Exam Vitals and nursing note reviewed.  Constitutional:      Appearance: She is not ill-appearing or diaphoretic.  HENT:     Head: Normocephalic and atraumatic.  Eyes:     Conjunctiva/sclera: Conjunctivae normal.  Cardiovascular:     Rate and Rhythm: Normal rate and regular rhythm.     Pulses: Normal pulses.  Pulmonary:     Effort: Pulmonary effort is normal.     Breath sounds: Normal breath sounds. No wheezing, rhonchi or rales.  Abdominal:     Palpations: Abdomen is soft.     Tenderness: There is no abdominal tenderness.  Musculoskeletal:     Cervical back: Neck supple.     Comments: Swelling and TTP to both anterior and popliteal areas of right knee; no erythema or increased warmth; ROM intact however limited s/2 pain; strength and sensation intact; 2+ DP Pulse  Skin:    General: Skin is warm and dry.  Neurological:     Mental Status: She is alert.     ED Results / Procedures / Treatments   Labs (all labs ordered are listed, but only abnormal results are displayed) Labs Reviewed  BASIC METABOLIC PANEL - Abnormal; Notable for the following components:      Result Value   Potassium 3.4 (*)    All other components within normal limits  D-DIMER, QUANTITATIVE - Abnormal; Notable for the following components:   D-Dimer, Quant 0.84 (*)    All other components within normal limits  CBC WITH DIFFERENTIAL/PLATELET - Abnormal; Notable for the following components:   WBC 3.1 (*)    Neutro Abs 1.1 (*)    All other components within normal limits  CBC WITH DIFFERENTIAL/PLATELET  TROPONIN I (HIGH SENSITIVITY)  TROPONIN I (HIGH SENSITIVITY)    EKG EKG Interpretation  Date/Time:  Thursday April 12 2021 15:41:21 EDT Ventricular Rate:  71 PR Interval:  138 QRS Duration: 104 QT Interval:  386 QTC Calculation: 420 R Axis:   13 Text Interpretation: Sinus rhythm Nonspecific T abnormalities, anterior leads No  significant change since last tracing Confirmed by Fredia Sorrow [phone]) on 04/12/2021 3:43:43 PM   Radiology CT Angio Chest PE W/Cm &/Or Wo Cm  Result Date: 04/12/2021 CLINICAL DATA:  PE suspected, low/intermediate prob, positive D-dimer History of pulmonary embolus. EXAM: CT ANGIOGRAPHY CHEST WITH CONTRAST TECHNIQUE: Multidetector CT imaging of the chest was performed using the standard  protocol during bolus administration of intravenous contrast. Multiplanar CT image reconstructions and MIPs were obtained to evaluate the vascular anatomy. CONTRAST:  187mL OMNIPAQUE IOHEXOL 350 MG/ML SOLN COMPARISON:  Chest CTA 02/12/2021 FINDINGS: Cardiovascular: There are no filling defects within the pulmonary arteries to suggest pulmonary embolus. There is streak artifact from dense IV contrast in the SVC the partially obscures the right upper lobe pulmonary artery. Heart is normal in size. No pericardial effusion. No aortic dissection or acute aortic findings. Mediastinum/Nodes: No mediastinal or hilar adenopathy. Decompressed esophagus. No thyroid nodule. Lungs/Pleura: Minimal scarring in the dependent right lower lobe. No acute airspace disease. No pleural fluid or pulmonary edema. The trachea and central bronchi are patent. No pulmonary mass. Upper Abdomen: No acute findings. Large volume of stool in the included colon. Musculoskeletal: There are no acute or suspicious osseous abnormalities. Review of the MIP images confirms the above findings. IMPRESSION: No pulmonary embolus or acute intrathoracic abnormality. Electronically Signed   By: Keith Rake M.D.   On: 04/12/2021 19:01   US Venous Img Lower Right (DVT Study)  Result Date: 04/12/2021 CLINICAL DATA:  Swelling/pain, history of DVT. EXAM: RIGHT LOWER EXTREMITY VENOUS DOPPLER ULTRASOUND TECHNIQUE: Gray-scale sonography with compression, as well as color and duplex ultrasound, were performed to evaluate the deep venous system(s) from the level of the  common femoral vein through the popliteal and proximal calf veins. COMPARISON:  None. FINDINGS: VENOUS Normal compressibility of the common femoral, superficial femoral, and popliteal veins, as well as the visualized calf veins. Visualized portions of profunda femoral vein and great saphenous vein unremarkable. No filling defects to suggest DVT on grayscale or color Doppler imaging. Doppler waveforms show normal direction of venous flow, normal respiratory plasticity and response to augmentation. Limited views of the contralateral common femoral vein are unremarkable. IMPRESSION: No evidence of DVT. Electronically Signed   By: Margaretha Sheffield MD   On: 04/12/2021 17:29   DG Knee Complete 4 Views Right  Result Date: 04/12/2021 CLINICAL DATA:  Right knee pain and swelling for the past 2 weeks. No injury. EXAM: RIGHT KNEE - COMPLETE 4+ VIEW COMPARISON:  None. FINDINGS: No acute fracture or dislocation. Small to moderate joint effusion. Joint spaces are preserved. Tricompartmental marginal osteophytes. Soft tissues are unremarkable. IMPRESSION: 1. No acute osseous abnormality. Small to moderate joint effusion. 2. Mild tricompartmental osteoarthritis. Electronically Signed   By: Titus Dubin M.D.   On: 04/12/2021 16:47    Procedures Procedures   Medications Ordered in ED Medications  iohexol (OMNIPAQUE) 350 MG/ML injection 100 mL (100 mLs Intravenous Contrast Given 04/12/21 1839)    ED Course  I have reviewed the triage vital signs and the nursing notes.  Pertinent labs & imaging results that were available during my care of the patient were reviewed by me and considered in my medical decision making (see chart for details).    MDM Rules/Calculators/A&P                          55 year old female who presents to the ED today complaining of atraumatic right knee pain/swelling for the past 2 weeks, gradually worsening.  Does have a history of DVT as well as PE in the past, not currently  anticoagulated.  She also mentions intermittent palpitations over the past several weeks.  No specific chest pain or shortness of breath.  On arrival to the ED vitals are stable.  Patient appears to be in no acute distress.  She  is afebrile, nontachycardic and nontachypneic.  Satting 100% on room air.  On exam she does have swelling and tenderness palpation to both anterior and popliteal area of the knee.  No overlying skin changes or increased warmth to the touch.  Doubt septic arthritis at this time.  With her history of DVT and PE this is definitely on my differential however we will also obtain an x-ray of the knee as well.  Patient does report history of osteoarthritis bilaterally.  Even her complaints of palpitations will obtain EKG as well as D-dimer and troponin at this time.  Will likely need CTA.   X-ray of knee with tricompartmental osteoarthritis and small to moderate joint effusion.   CBC without leukocytosis. Hgb stable at 12.6.  BMP with potassium 3.4. No other electrolyte abnormalities.  Troponin 5 D dimer elevated at 0.84. Pt has not received her DVT Study yet however given complaint of palpitations and elevated dimer with hx of PE feel pt requires CTA at this time.   DVT study negative CTA negative  Will provide knee immobilizer and crutches at this time given joint effusion. I doubt septic arthritis at this time. Pt still have ROM of the knee. Will have pt follow up with orthopedics for further evaluation. RICE therapy discussed as well. Pt in agreement with plan and stable for discharge home.   This note was prepared using Dragon voice recognition software and may include unintentional dictation errors due to the inherent limitations of voice recognition software.  Final Clinical Impression(s) / ED Diagnoses Final diagnoses:  Acute pain of right knee  Effusion of right knee    Rx / DC Orders ED Discharge Orders    None       Discharge Instructions     Please wear  knee immobilizer and use crutches at this time. Follow up with Dr. Raeford Razor sports medicine for further evaluation of the fluid in your knee joint.   While at home please rest, ice, and elevate your knee to help reduce pain/swelling. You can take Ibuprofen and Tylenol as needed for pain.   Return to the ED for any worsening symptoms       Eustaquio Maize, Hershal Coria 04/12/21 1918    Fredia Sorrow, MD 04/13/21 [phone]

## 2021-04-12 NOTE — ED Triage Notes (Signed)
Her right knee is swelling and painful. Symptoms started yesterday. Hx of PE and DVT in the past.

## 2021-04-12 NOTE — ED Notes (Signed)
C/o "Right knee has been giving me problems for about 2weeks, For a week Right Knee, swelling has not gone. It usually the swelling goes down, when I elevate it. I am concern because supposed to be flying out Wednesday evening to [place], [place]".   "Has hx of blood clots, nor not currently on blood thinners."  4/10 pain.

## 2021-04-12 NOTE — Discharge Instructions (Addendum)
Please wear knee immobilizer and use crutches at this time. Follow up with Dr. Raeford Razor sports medicine for further evaluation of the fluid in your knee joint.   While at home please rest, ice, and elevate your knee to help reduce pain/swelling. You can take Ibuprofen and Tylenol as needed for pain.   Return to the ED for any worsening symptoms

## 2021-04-17 ENCOUNTER — Encounter: Payer: Self-pay | Admitting: Family Medicine

## 2021-04-17 ENCOUNTER — Other Ambulatory Visit: Payer: Self-pay

## 2021-04-17 ENCOUNTER — Ambulatory Visit: Payer: Self-pay

## 2021-04-17 ENCOUNTER — Ambulatory Visit: Payer: 59

## 2021-04-17 ENCOUNTER — Ambulatory Visit (INDEPENDENT_AMBULATORY_CARE_PROVIDER_SITE_OTHER): Payer: 59 | Admitting: Family Medicine

## 2021-04-17 VITALS — BP 110/78 | Ht 67.0 in | Wt 227.0 lb

## 2021-04-17 DIAGNOSIS — M659 Synovitis and tenosynovitis, unspecified: Secondary | ICD-10-CM | POA: Diagnosis not present

## 2021-04-17 DIAGNOSIS — M179 Osteoarthritis of knee, unspecified: Secondary | ICD-10-CM | POA: Insufficient documentation

## 2021-04-17 DIAGNOSIS — M25561 Pain in right knee: Secondary | ICD-10-CM

## 2021-04-17 LAB — CELL COUNT + DIFF, W/O CRYST-SYNVL FLD
Basophils, %: 0 %
Eosinophils-Synovial: 0 % (ref 0–2)
Lymphocytes-Synovial Fld: 42 % (ref 0–74)
Monocyte/Macrophage: 53 % (ref 0–69)
Neutrophil, Synovial: 0 % (ref 0–24)
Synoviocytes, %: 5 % (ref 0–15)
WBC, Synovial: 1021 cells/uL — ABNORMAL HIGH (ref <150)

## 2021-04-17 MED ORDER — TRIAMCINOLONE ACETONIDE 40 MG/ML IJ SUSP
40.0000 mg | Freq: Once | INTRAMUSCULAR | Status: AC
  Administered 2021-04-17: 40 mg via INTRA_ARTICULAR

## 2021-04-17 NOTE — Assessment & Plan Note (Signed)
Symptoms seem most consistent with synovitis and effusion is normal appearance.  Can have some contribution from degenerative changes of the meniscus. -Counseled on home exercise therapy and supportive care. -Aspiration and injection. -Synovial fluid analysis.

## 2021-04-17 NOTE — Progress Notes (Signed)
Michelle Nash - 55 y.o. female MRN [id number]  Date of birth: 07/19/1966  SUBJECTIVE:  Including CC & ROS.  No chief complaint on file.   Michelle Nash is a 55 y.o. female that is presenting with acute right knee pain.  Pain is been ongoing for 2 weeks.  Pain is severe with no inciting event.  Does have a history of lupus.  No improvement with therapies today..  Independent review of the right knee x-ray from 4/28 shows mild medial joint space changes.   Review of Systems See HPI   HISTORY: Past Medical, Surgical, Social, and Family History Reviewed & Updated per EMR.   Pertinent Historical Findings include:  Past Medical History:  Diagnosis Date  . Coronary artery spasm ([place])   . Fibromyalgia   . Lupus ([place])   . Osteoarthritis   . Pulmonary emboli ([place])   . Sjogren's disease [facility])     Past Surgical History:  Procedure Laterality Date  . CESAREAN SECTION    . CHOLECYSTECTOMY      History reviewed. No pertinent family history.  Social History   Socioeconomic History  . Marital status: Single    Spouse name: Not on file  . Number of children: Not on file  . Years of education: Not on file  . Highest education level: Not on file  Occupational History  . Not on file  Tobacco Use  . Smoking status: Never Smoker  . Smokeless tobacco: Never Used  Substance and Sexual Activity  . Alcohol use: Never  . Drug use: Never  . Sexual activity: Not on file  Other Topics Concern  . Not on file  Social History Narrative  . Not on file   Social Determinants of Health   Financial Resource Strain: Not on file  Food Insecurity: Not on file  Transportation Needs: Not on file  Physical Activity: Not on file  Stress: Not on file  Social Connections: Not on file  Intimate Partner Violence: Not on file     PHYSICAL EXAM:  VS: BP 110/78 (BP Location: Left Arm, Patient Position: Sitting, Cuff Size: Large)   Ht 5\' 7"  (1.702 m)   Wt 227 lb (103 kg)   BMI 35.55 kg/m   Physical Exam Gen: NAD, alert, cooperative with exam, well-appearing MSK:  Right knee: Effusion. Limited range of motion. Tenderness to palpation around the joint space. Neurovascular intact  Limited ultrasound: Right knee:  Moderate effusion. Increased hyperemia surrounding the synovium. Normal-appearing quadricep patellar tendon. Mild degenerative changes of the medial meniscus. Mild degenerative change of the lateral meniscus   Summary: Findings most consistent with effusion and synovitis.  Ultrasound and interpretation by Clearance Coots, MD   Aspiration/Injection Procedure Note Chantrice Hagg [birth year]  Procedure: Aspiration and Injection Indications: Right knee pain  Procedure Details Consent: Risks of procedure as well as the alternatives and risks of each were explained to the (patient/caregiver).  Consent for procedure obtained. Time Out: Verified patient identification, verified procedure, site/side was marked, verified correct patient position, special equipment/implants available, medications/allergies/relevent history reviewed, required imaging and test results available.  Performed.  The area was cleaned with iodine and alcohol swabs.    The Right knee superior lateral suprapatellar pouch was injected using 4 cc of 1% lidocaine on a 25-gauge 1-1/2 inch needle.  An 18-gauge 1-1/2 needle was used to achieve aspiration.  The syringe was switched to mixture containing 1 cc's of 40 mg Kenalog and 4 cc's of 0.25% bupivacaine was injected.  Ultrasound was used.  Images were obtained in level views showing the injection.    Amount of Fluid Aspirated: 2mL Character of Fluid: clear and straw colored Fluid was sent FXT:KWIO count and crystal identification  A sterile dressing was applied.  Patient did tolerate procedure well.    ASSESSMENT & PLAN:   Synovitis of right knee Symptoms seem most consistent with synovitis and effusion is normal appearance.  Can have  some contribution from degenerative changes of the meniscus. -Counseled on home exercise therapy and supportive care. -Aspiration and injection. -Synovial fluid analysis.

## 2021-04-17 NOTE — Addendum Note (Signed)
Addended by: Cresenciano Lick on: 04/17/2021 10:21 AM   Modules accepted: Orders

## 2021-04-17 NOTE — Patient Instructions (Signed)
Nice to meet you Please continue heat if it helps Please use the crutches if needed  Please send me a message in MyChart with any questions or updates.  Please see me back in 4 weeks.   --Dr. Raeford Razor

## 2021-04-19 ENCOUNTER — Telehealth: Payer: Self-pay | Admitting: Family Medicine

## 2021-04-19 ENCOUNTER — Ambulatory Visit: Payer: Medicare Other | Attending: Physician Assistant | Primary: Internal Medicine

## 2021-04-19 ENCOUNTER — Ambulatory Visit: Payer: Medicare Other | Attending: Internal Medicine | Primary: Internal Medicine

## 2021-04-19 ENCOUNTER — Ambulatory Visit
Admit: 2021-04-19 | Discharge: 2021-04-20 | Payer: Medicare Other | Attending: Internal Medicine | Primary: Internal Medicine

## 2021-04-19 ENCOUNTER — Encounter: Payer: Medicare Other | Attending: Physician Assistant | Primary: Internal Medicine

## 2021-04-19 DIAGNOSIS — G43009 Migraine without aura, not intractable, without status migrainosus: Principal | ICD-10-CM

## 2021-04-19 DIAGNOSIS — Z9119 Patient's noncompliance with other medical treatment and regimen: Secondary | ICD-10-CM

## 2021-04-19 DIAGNOSIS — I2510 Atherosclerotic heart disease of native coronary artery without angina pectoris: Secondary | ICD-10-CM

## 2021-04-19 DIAGNOSIS — M797 Fibromyalgia: Secondary | ICD-10-CM

## 2021-04-19 DIAGNOSIS — I8290 Acute embolism and thrombosis of unspecified vein: Secondary | ICD-10-CM

## 2021-04-19 DIAGNOSIS — M329 Systemic lupus erythematosus, unspecified: Secondary | ICD-10-CM

## 2021-04-19 DIAGNOSIS — M3219 Other organ or system involvement in systemic lupus erythematosus: Principal | ICD-10-CM

## 2021-04-19 DIAGNOSIS — IMO0002 Atrial fib/flutter, transient: Secondary | ICD-10-CM

## 2021-04-19 DIAGNOSIS — G459 Transient cerebral ischemic attack, unspecified: Secondary | ICD-10-CM

## 2021-04-19 DIAGNOSIS — F323 Major depressive disorder, single episode, severe with psychotic features: Secondary | ICD-10-CM

## 2021-04-19 DIAGNOSIS — R251 Tremor, unspecified: Secondary | ICD-10-CM

## 2021-04-19 DIAGNOSIS — I10 Essential (primary) hypertension: Principal | ICD-10-CM

## 2021-04-19 DIAGNOSIS — I2699 Other pulmonary embolism without acute cor pulmonale: Secondary | ICD-10-CM

## 2021-04-19 DIAGNOSIS — Z780 Asymptomatic menopausal state: Secondary | ICD-10-CM

## 2021-04-19 DIAGNOSIS — E559 Vitamin D deficiency, unspecified: Secondary | ICD-10-CM

## 2021-04-19 DIAGNOSIS — I6350 Cerebral infarction due to unspecified occlusion or stenosis of unspecified cerebral artery: Secondary | ICD-10-CM

## 2021-04-19 DIAGNOSIS — J449 Chronic obstructive pulmonary disease, unspecified: Secondary | ICD-10-CM

## 2021-04-19 DIAGNOSIS — Z79899 Other long term (current) drug therapy: Secondary | ICD-10-CM

## 2021-04-19 DIAGNOSIS — Z6835 Body mass index (BMI) 35.0-35.9, adult: Principal | ICD-10-CM

## 2021-04-19 DIAGNOSIS — M8580 Other specified disorders of bone density and structure, unspecified site: Secondary | ICD-10-CM

## 2021-04-19 DIAGNOSIS — M1990 Unspecified osteoarthritis, unspecified site: Secondary | ICD-10-CM

## 2021-04-19 DIAGNOSIS — M3500 Sicca syndrome, unspecified: Secondary | ICD-10-CM

## 2021-04-19 DIAGNOSIS — E785 Hyperlipidemia, unspecified: Secondary | ICD-10-CM

## 2021-04-19 DIAGNOSIS — M23006 Cystic meniscus, unspecified meniscus, right knee: Secondary | ICD-10-CM

## 2021-04-19 DIAGNOSIS — Z1231 Encounter for screening mammogram for malignant neoplasm of breast: Secondary | ICD-10-CM

## 2021-04-19 DIAGNOSIS — M170 Bilateral primary osteoarthritis of knee: Secondary | ICD-10-CM

## 2021-04-19 DIAGNOSIS — G894 Chronic pain syndrome: Secondary | ICD-10-CM

## 2021-04-19 MED ORDER — DILTIAZEM HCL ER 180 MG PO CP24
ORAL | 3 refills | 30.00000 days | Status: CP
Start: 2021-04-19 — End: ?

## 2021-04-19 MED ORDER — LASMIDITAN SUCCINATE 50 MG PO TABS
50 mg | ORAL | 2 refills | Status: CP | PRN
Start: 2021-04-19 — End: ?

## 2021-04-19 MED ORDER — HYDROXYCHLOROQUINE SULFATE 200 MG PO TABS
200 mg | Freq: Two times a day (BID) | ORAL | 1 refills | Status: CP
Start: 2021-04-19 — End: ?

## 2021-04-19 MED ORDER — SERTRALINE HCL 50 MG PO TABS
3 refills | Status: CP
Start: 2021-04-19 — End: ?

## 2021-04-19 MED ORDER — ERENUMAB-AOOE 140 MG/ML SC SOAJ
140 mg | SUBCUTANEOUS | 3 refills | 30.00000 days | Status: CP
Start: 2021-04-19 — End: ?

## 2021-04-19 MED ORDER — GABAPENTIN 600 MG PO TABS
600 mg | Freq: Three times a day (TID) | ORAL | 3 refills | Status: CP
Start: 2021-04-19 — End: ?

## 2021-04-19 MED ORDER — CYCLOBENZAPRINE HCL 10 MG PO TABS
10 mg | Freq: Every evening | ORAL | 1 refills | Status: CP
Start: 2021-04-19 — End: ?

## 2021-04-19 MED ORDER — VALSARTAN-HYDROCHLOROTHIAZIDE 320-25 MG PO TABS
1 | ORAL_TABLET | Freq: Every day | ORAL | 3 refills | Status: CP
Start: 2021-04-19 — End: ?

## 2021-04-19 MED ORDER — DICLOFENAC SODIUM 1 % EX GEL
TOPICAL | 1 refills | 13.00000 days | Status: CP
Start: 2021-04-19 — End: ?

## 2021-04-19 NOTE — Telephone Encounter (Signed)
Left VM for patient. If she calls back please have her speak with a nurse/CMA and inform that fluid analysis did not show infection. Continue current treatment plan.   If any questions then please take the best time and phone number to call and I will try to call her back.   Rosemarie Ax, MD [facility] 04/19/2021, 2:10 PM

## 2021-04-20 ENCOUNTER — Ambulatory Visit: Payer: Medicare Other | Attending: Optometrist | Primary: Internal Medicine

## 2021-04-20 DIAGNOSIS — J449 Chronic obstructive pulmonary disease, unspecified: Secondary | ICD-10-CM

## 2021-04-20 DIAGNOSIS — G459 Transient cerebral ischemic attack, unspecified: Secondary | ICD-10-CM

## 2021-04-20 DIAGNOSIS — I2510 Atherosclerotic heart disease of native coronary artery without angina pectoris: Secondary | ICD-10-CM

## 2021-04-20 DIAGNOSIS — M3501 Sicca syndrome with keratoconjunctivitis: Secondary | ICD-10-CM

## 2021-04-20 DIAGNOSIS — Z9119 Patient's noncompliance with other medical treatment and regimen: Secondary | ICD-10-CM

## 2021-04-20 DIAGNOSIS — M1990 Unspecified osteoarthritis, unspecified site: Secondary | ICD-10-CM

## 2021-04-20 DIAGNOSIS — I10 Essential (primary) hypertension: Principal | ICD-10-CM

## 2021-04-20 DIAGNOSIS — I2699 Other pulmonary embolism without acute cor pulmonale: Secondary | ICD-10-CM

## 2021-04-20 DIAGNOSIS — M329 Systemic lupus erythematosus, unspecified: Secondary | ICD-10-CM

## 2021-04-20 DIAGNOSIS — R251 Tremor, unspecified: Secondary | ICD-10-CM

## 2021-04-20 DIAGNOSIS — IMO0002 Atrial fib/flutter, transient: Secondary | ICD-10-CM

## 2021-04-20 DIAGNOSIS — Z79899 Other long term (current) drug therapy: Principal | ICD-10-CM

## 2021-04-20 DIAGNOSIS — H2513 Age-related nuclear cataract, bilateral: Secondary | ICD-10-CM

## 2021-04-20 DIAGNOSIS — E785 Hyperlipidemia, unspecified: Secondary | ICD-10-CM

## 2021-04-20 DIAGNOSIS — M3500 Sicca syndrome, unspecified: Secondary | ICD-10-CM

## 2021-04-20 DIAGNOSIS — I8290 Acute embolism and thrombosis of unspecified vein: Secondary | ICD-10-CM

## 2021-04-20 DIAGNOSIS — I6350 Cerebral infarction due to unspecified occlusion or stenosis of unspecified cerebral artery: Secondary | ICD-10-CM

## 2021-04-20 MED ORDER — CYCLOSPORINE 0.05 % OP EMUL
1 [drp] | Freq: Two times a day (BID) | OPHTHALMIC | 5 refills | Status: CN
Start: 2021-04-20 — End: ?

## 2021-04-20 MED ORDER — CYCLOSPORINE 0.05 % OP EMUL
1 [drp] | Freq: Two times a day (BID) | OPHTHALMIC | 3 refills | Status: CP
Start: 2021-04-20 — End: ?

## 2021-04-25 DIAGNOSIS — Z1231 Encounter for screening mammogram for malignant neoplasm of breast: Secondary | ICD-10-CM

## 2021-04-25 DIAGNOSIS — N951 Menopausal and female climacteric states: Principal | ICD-10-CM

## 2021-04-27 MED ORDER — CYCLOSPORINE 0.05 % OP EMUL
1 [drp] | Freq: Two times a day (BID) | OPHTHALMIC | 3 refills | 22.50000 days | Status: CP
Start: 2021-04-27 — End: ?

## 2021-05-03 ENCOUNTER — Other Ambulatory Visit: Payer: Self-pay

## 2021-05-03 ENCOUNTER — Emergency Department (HOSPITAL_BASED_OUTPATIENT_CLINIC_OR_DEPARTMENT_OTHER): Payer: 59

## 2021-05-03 ENCOUNTER — Observation Stay (HOSPITAL_BASED_OUTPATIENT_CLINIC_OR_DEPARTMENT_OTHER)
Admission: EM | Admit: 2021-05-03 | Discharge: 2021-05-05 | Disposition: A | Discharge status: Home / Self Care | Payer: 59 | Attending: Internal Medicine | Admitting: Internal Medicine

## 2021-05-03 ENCOUNTER — Encounter (HOSPITAL_BASED_OUTPATIENT_CLINIC_OR_DEPARTMENT_OTHER): Payer: Self-pay

## 2021-05-03 DIAGNOSIS — G459 Transient cerebral ischemic attack, unspecified: Secondary | ICD-10-CM | POA: Insufficient documentation

## 2021-05-03 DIAGNOSIS — Z79899 Other long term (current) drug therapy: Secondary | ICD-10-CM | POA: Insufficient documentation

## 2021-05-03 DIAGNOSIS — R200 Anesthesia of skin: Secondary | ICD-10-CM | POA: Diagnosis not present

## 2021-05-03 DIAGNOSIS — R0789 Other chest pain: Principal | ICD-10-CM | POA: Insufficient documentation

## 2021-05-03 DIAGNOSIS — I201 Angina pectoris with documented spasm: Secondary | ICD-10-CM

## 2021-05-03 DIAGNOSIS — R079 Chest pain, unspecified: Secondary | ICD-10-CM | POA: Diagnosis not present

## 2021-05-03 DIAGNOSIS — I10 Essential (primary) hypertension: Secondary | ICD-10-CM | POA: Diagnosis not present

## 2021-05-03 DIAGNOSIS — Z20822 Contact with and (suspected) exposure to covid-19: Secondary | ICD-10-CM | POA: Diagnosis not present

## 2021-05-03 DIAGNOSIS — M329 Systemic lupus erythematosus, unspecified: Secondary | ICD-10-CM

## 2021-05-03 DIAGNOSIS — M1990 Unspecified osteoarthritis, unspecified site: Secondary | ICD-10-CM

## 2021-05-03 DIAGNOSIS — M3500 Sicca syndrome, unspecified: Secondary | ICD-10-CM

## 2021-05-03 LAB — CBC WITH DIFFERENTIAL/PLATELET
Abs Immature Granulocytes: 0.01 10*3/uL (ref 0.00–0.07)
Basophils Absolute: 0 10*3/uL (ref 0.0–0.1)
Basophils Relative: 0 %
Eosinophils Absolute: 0 10*3/uL (ref 0.0–0.5)
Eosinophils Relative: 0 %
HCT: 43 % (ref 36.0–46.0)
Hemoglobin: 13.7 g/dL (ref 12.0–15.0)
Immature Granulocytes: 0 %
Lymphocytes Relative: 36 %
Lymphs Abs: 1.1 10*3/uL (ref 0.7–4.0)
MCH: 28.2 pg (ref 26.0–34.0)
MCHC: 31.9 g/dL (ref 30.0–36.0)
MCV: 88.5 fL (ref 80.0–100.0)
Monocytes Absolute: 0.3 10*3/uL (ref 0.1–1.0)
Monocytes Relative: 10 %
Neutro Abs: 1.7 10*3/uL (ref 1.7–7.7)
Neutrophils Relative %: 54 %
Platelets: 231 10*3/uL (ref 150–400)
RBC: 4.86 MIL/uL (ref 3.87–5.11)
RDW: 13.5 % (ref 11.5–15.5)
WBC: 3.2 10*3/uL — ABNORMAL LOW (ref 4.0–10.5)
nRBC: 0 % (ref 0.0–0.2)

## 2021-05-03 LAB — COMPREHENSIVE METABOLIC PANEL
ALT: 12 U/L (ref 0–44)
AST: 19 U/L (ref 15–41)
Albumin: 4.1 g/dL (ref 3.5–5.0)
Alkaline Phosphatase: 74 U/L (ref 38–126)
Anion gap: 9 (ref 5–15)
BUN: 16 mg/dL (ref 6–20)
CO2: 27 mmol/L (ref 22–32)
Calcium: 9.3 mg/dL (ref 8.9–10.3)
Chloride: 100 mmol/L (ref 98–111)
Creatinine, Ser: 0.85 mg/dL (ref 0.44–1.00)
GFR, Estimated: >60 mL/min (ref >60)
Glucose, Bld: 80 mg/dL (ref 70–99)
Potassium: 3.8 mmol/L (ref 3.5–5.1)
Sodium: 136 mmol/L (ref 135–145)
Total Bilirubin: 0.3 mg/dL (ref 0.3–1.2)
Total Protein: 8.7 g/dL — ABNORMAL HIGH (ref 6.5–8.1)

## 2021-05-03 LAB — RESP PANEL BY RT-PCR (FLU A&B, COVID) ARPGX2
Influenza A by PCR: NEGATIVE
Influenza B by PCR: NEGATIVE
SARS Coronavirus 2 by RT PCR: NEGATIVE

## 2021-05-03 LAB — TROPONIN I (HIGH SENSITIVITY)
Troponin I (High Sensitivity): 2 ng/L (ref <18)
Troponin I (High Sensitivity): 3 ng/L (ref <18)
Troponin I (High Sensitivity): 3 ng/L (ref <18)
Troponin I (High Sensitivity): 3 ng/L (ref <18)

## 2021-05-03 LAB — LIPID PANEL
Cholesterol: 170 mg/dL (ref 0–200)
HDL: 79 mg/dL (ref >40)
LDL Cholesterol: 84 mg/dL (ref 0–99)
Total CHOL/HDL Ratio: 2.2 RATIO
Triglycerides: 33 mg/dL (ref <150)
VLDL: 7 mg/dL (ref 0–40)

## 2021-05-03 LAB — HIV ANTIBODY (ROUTINE TESTING W REFLEX): HIV Screen 4th Generation wRfx: NONREACTIVE

## 2021-05-03 MED ORDER — METOCLOPRAMIDE HCL 5 MG/ML IJ SOLN
10.0000 mg | Freq: Once | INTRAMUSCULAR | Status: AC
  Administered 2021-05-03: 10 mg via INTRAVENOUS
  Filled 2021-05-03: qty 2

## 2021-05-03 MED ORDER — ACETAMINOPHEN 325 MG PO TABS: 650.0000 mg | ORAL_TABLET | ORAL | Status: DC | PRN

## 2021-05-03 MED ORDER — ENOXAPARIN SODIUM 40 MG/0.4ML IJ SOSY
40.0000 mg | PREFILLED_SYRINGE | INTRAMUSCULAR | Status: DC
  Administered 2021-05-03 – 2021-05-04 (×2): 40 mg via SUBCUTANEOUS
  Filled 2021-05-03 (×2): qty 0.4

## 2021-05-03 MED ORDER — DILTIAZEM HCL 60 MG PO TABS
90.0000 mg | ORAL_TABLET | Freq: Four times a day (QID) | ORAL | Status: DC
  Administered 2021-05-03 – 2021-05-04 (×5): 90 mg via ORAL
  Filled 2021-05-03 (×5): qty 2

## 2021-05-03 MED ORDER — HYDROCHLOROTHIAZIDE 25 MG PO TABS
25.0000 mg | ORAL_TABLET | Freq: Every day | ORAL | Status: DC
  Administered 2021-05-04 – 2021-05-05 (×2): 25 mg via ORAL
  Filled 2021-05-03 (×2): qty 1

## 2021-05-03 MED ORDER — VALSARTAN-HYDROCHLOROTHIAZIDE 320-25 MG PO TABS: 1.0000 | ORAL_TABLET | Freq: Every day | ORAL | Status: DC

## 2021-05-03 MED ORDER — IRBESARTAN 150 MG PO TABS
300.0000 mg | ORAL_TABLET | Freq: Every day | ORAL | Status: DC
  Administered 2021-05-04 – 2021-05-05 (×2): 300 mg via ORAL
  Filled 2021-05-03 (×2): qty 2

## 2021-05-03 MED ORDER — ONDANSETRON HCL 4 MG/2ML IJ SOLN: 4.0000 mg | Freq: Four times a day (QID) | INTRAMUSCULAR | Status: DC | PRN

## 2021-05-03 MED ORDER — SERTRALINE HCL 100 MG PO TABS
100.0000 mg | ORAL_TABLET | Freq: Every day | ORAL | Status: DC
  Administered 2021-05-03 – 2021-05-04 (×2): 100 mg via ORAL
  Filled 2021-05-03 (×2): qty 1

## 2021-05-03 MED ORDER — GABAPENTIN 600 MG PO TABS
600.0000 mg | ORAL_TABLET | Freq: Three times a day (TID) | ORAL | Status: DC
  Administered 2021-05-03 – 2021-05-05 (×5): 600 mg via ORAL
  Filled 2021-05-03 (×5): qty 1

## 2021-05-03 NOTE — ED Notes (Signed)
Telephone report given to Conley Rolls, Therapist, sports, 6E at [facility].

## 2021-05-03 NOTE — ED Notes (Signed)
Called by Registration of this pt's presentation complaint, pt sitting in waiting area, BEFAST is currently negative, able to ambulate without issues, states she has hx of TIA's and previous CVA and was concerned about feeling dizzy and difficulty speaking, states now after arrival symptoms have subsided. Pt placed in exam room 5 for immediate further evaluation.

## 2021-05-03 NOTE — Plan of Care (Signed)

## 2021-05-03 NOTE — ED Notes (Signed)
Room assignment rec, [facility] Team notified for request of transport. Spoke with Doug-RN

## 2021-05-03 NOTE — H&P (Signed)
History and Physical        [facility] Admission Note Date: 05/03/2021  Patient name: Michelle Nash Medical record number: [id number] Date of birth: Jul 04, 1966 Age: 55 y.o. Gender: female  PCP: Pcp, No    Patient coming from: [place] [facility]  I have reviewed all records in the [place] [facility].    Chief Complaint:  Jaw Tightness, Chest Pain   HPI: Michelle Nash is 55 y.o. female with PMH SLE, OA, fibromyalgia, HTN who presents for jaw tightness, left >right, radiated into left side of neck. Symptoms started 1 hour prior to presenting to ER. At that time had associated numbness of entire tongue and difficulty with speech. She subsequently developed chest fluttering and mild chest pain. Last known normal time was 10:15 am. Has a hx/o PE five years ago following cholecystectomy, not currently on anticoagulation.   Patient reports that symptoms have essentially all resolved except for some ?left sided facial numbness. Endorses period of time where she had multiple TIAs without known cause, this occurred about 2 years ago. States that symptoms today felt similar to prior TIA.    ED work-up/course:  Patient here for evaluation of paresthesias as well as chest pain that started earlier today. EKG is abnormal but similar when compared to priors, initial troponin is negative. She is neurologically intact on examination aside from altered sensation to light touch to the left lower face. Initial imaging is negative for acute CVA. Given her complex medical history plan to obtain MRI to further evaluate her paresthesias. She has a hear score of four, given her chest pain plan to admit for further evaluation of her chest pain. Hospitalist consulted for admission for observation. Patient updated findings of studies recommendation for further workup and she is in agreement with treatment plan.   Review of Systems: Positives marked in  'bold' Constitutional: Denies fever, chills, diaphoresis, poor appetite and fatigue.  HEENT: Denies photophobia, eye pain, redness, hearing loss, ear pain, congestion, sore throat, rhinorrhea, sneezing, mouth sores, trouble swallowing, neck pain, neck stiffness and tinnitus.   Respiratory: Denies SOB, DOE, cough, chest tightness,  and wheezing.   Cardiovascular: Denies chest pain, palpitations and leg swelling.  Gastrointestinal: Denies nausea, vomiting, abdominal pain, diarrhea, constipation, blood in stool and abdominal distention.  Genitourinary: Denies dysuria, urgency, frequency, hematuria, flank pain and difficulty urinating.  Musculoskeletal: Denies myalgias, back pain, joint swelling, arthralgias and gait problem.  Skin: Denies pallor, rash and wound.  Neurological: Denies dizziness, seizures, syncope, weakness, light-headedness, numbness and headaches.  Hematological: Denies adenopathy. Easy bruising, personal or family bleeding history  Psychiatric/Behavioral: Denies suicidal ideation, mood changes, confusion, nervousness, sleep disturbance and agitation  Past Medical History: Past Medical History:  Diagnosis Date  . Coronary artery spasm ([facility])   . Fibromyalgia   . Lupus (Michelle Nash)   . Osteoarthritis   . Pulmonary emboli (Michelle Nash)   . Sjogren's disease [facility])     Past Surgical History:  Procedure Laterality Date  . CESAREAN SECTION    . CHOLECYSTECTOMY      Medications: Prior to Admission medications   Medication Sig Start Date End Date Taking? Authorizing Provider  cholecalciferol (VITAMIN D) 25 MCG (1000 UNIT) tablet  50,000 units once a week   Yes [provider]  cycloSPORINE (RESTASIS) 0.05 % ophthalmic emulsion Apply to eye. 12/20/19  Yes [provider]  diltiazem (CARDIZEM) 90 MG tablet Take 90 mg by mouth 4 (four) times daily.   Yes [provider]  Erenumab-aooe (AIMOVIG) 140 MG/ML SOAJ every 30 (thirty) days. 12/20/19  Yes [provider]   gabapentin (NEURONTIN) 600 MG tablet Take 600 mg by mouth 3 (three) times daily.   Yes [provider]  sertraline (ZOLOFT) 100 MG tablet Take 100 mg by mouth daily.   Yes [provider]  valsartan-hydrochlorothiazide (DIOVAN-HCT) 320-25 MG tablet Take 1 tablet by mouth daily.   Yes [provider]  cyclobenzaprine (FLEXERIL) 10 MG tablet Take 1 tablet by mouth at bedtime. 01/30/21   [provider]  HYDROcodone-acetaminophen (NORCO/VICODIN) 5-325 MG tablet Take 1 tablet by mouth every 4 (four) hours as needed. 02/12/21   Michelle Pence, MD  losartan-hydrochlorothiazide (HYZAAR) 100-25 MG tablet Take 1 tablet by mouth daily. 01/30/21   [provider]    Allergies:   Allergies  Allergen Reactions  . Lisinopril Cough    ACE cough    Social History:  reports that she has never smoked. She has never used smokeless tobacco. She reports that she does not drink alcohol and does not use drugs.  Family History: History reviewed. No pertinent family history.  Physical Exam: Blood pressure 107/73, pulse 60, temperature 98.4 F (36.9 C), temperature source Oral, resp. rate 17, height 5\' 7"  (1.702 m), weight 104.3 kg, SpO2 98 %. General: Alert, awake, oriented x3, in no acute distress. Eyes: pink conjunctiva,anicteric sclera, pupils equal and reactive to light and accomodation, HEENT: normocephalic, atraumatic, oropharynx clear Neck: supple, no masses or lymphadenopathy, no goiter, no bruits, no JVD CVS: Regular rate and rhythm, without murmurs, rubs or gallops. No lower extremity edema Resp : Clear to auscultation bilaterally, no wheezing, rales or rhonchi. GI : Soft, nontender, nondistended, positive bowel sounds, no masses. No hepatomegaly. No hernia.  Musculoskeletal: No clubbing or cyanosis, positive pedal pulses. No contracture. ROM intact  Neuro: Diminished sensation to left lower side of face compared to right. Otherwise CN intact. Strength  5/5 in all extremities. Normal finger to nose testing.  Psych: alert and oriented x 3, normal mood and affect Skin: no rashes or lesions, warm and dry   LABS on Admission: I have personally reviewed all the labs and imagings below    Basic Metabolic Panel: Recent Labs  Lab 05/03/21 1140  NA 136  K 3.8  CL 100  CO2 27  GLUCOSE 80  BUN 16  CREATININE 0.85  CALCIUM 9.3   Liver Function Tests: Recent Labs  Lab 05/03/21 1140  AST 19  ALT 12  ALKPHOS 74  BILITOT 0.3  PROT 8.7*  ALBUMIN 4.1   No results for input(s): LIPASE, AMYLASE in the last 168 hours. No results for input(s): AMMONIA in the last 168 hours. CBC: Recent Labs  Lab 05/03/21 1140  WBC 3.2*  NEUTROABS 1.7  HGB 13.7  HCT 43.0  MCV 88.5  PLT 231   Cardiac Enzymes: No results for input(s): CKTOTAL, CKMB, CKMBINDEX, TROPONINI in the last 168 hours. BNP: Invalid input(s): POCBNP CBG: No results for input(s): GLUCAP in the last 168 hours.  Radiological Exams on Admission:  DG Chest 2 View  Result Date: 05/03/2021 CLINICAL DATA:  Chest pain. EXAM: CHEST - 2 VIEW COMPARISON:  CT angiogram chest 04/12/2021. Prior chest radiographs  02/12/2021. FINDINGS: Heart size within normal limits. No appreciable airspace consolidation. No evidence of pleural effusion or pneumothorax. No acute bony abnormality identified. IMPRESSION: No evidence of active cardiopulmonary disease. Electronically Signed   By: Kellie Simmering DO   On: 05/03/2021 12:33   CT Head Wo Contrast  Result Date: 05/03/2021 CLINICAL DATA:  Neuro deficit, acute, stroke suspected; left facial weakness, speech difficulties. Additional provided: Face and tongue numbness, jaw tightening, confusion, blurry vision. Patient reports history of TIAs. EXAM: CT HEAD WITHOUT CONTRAST TECHNIQUE: Contiguous axial images were obtained from the base of the skull through the vertex without intravenous contrast. COMPARISON:  No pertinent prior exams available for comparison.  FINDINGS: Brain: Cerebral volume is normal. There is no acute intracranial hemorrhage. No demarcated cortical infarct. No extra-axial fluid collection. No evidence of intracranial mass. No midline shift. Partially empty sella turcica. Vascular: No hyperdense vessel. Skull: Normal. Negative for fracture or focal lesion. Sinuses/Orbits: Visualized orbits show no acute finding. No significant paranasal sinus disease at the imaged levels. IMPRESSION: No evidence of acute intracranial abnormality. Partially empty sella turcica. This finding is very commonly incidental, but can be associated with idiopathic intracranial hypertension. Electronically Signed   By: Kellie Simmering DO   On: 05/03/2021 12:32      EKG: Independently reviewed. Sinus rhythm.    Assessment/Plan Active Problems:   Chest pain   TIA (transient ischemic attack)   Left facial numbness   Essential hypertension   Coronary artery spasm ([facility])   Sjogren's disease ([place])   Osteoarthritis   Lupus ([place])  Left Facial Numbness/Jaw Tightness Suspect TIA  Suspect TIA as etiology of symptoms given essentially resolved (only mild left facial numbness lingers) with h/o multiple TIAs reported versus CVA. CT head negative for acute pathology.  -observation on telemetry  -MRI brain ordered and pending  -obtain echo  -carotid dopplers  -obtain lipid panel, suspect patient would benefit from statin/ASA therapy given reported history  -neuro checks q4h  -OT/PT/SLP consults  -stroke swallow screen prior to diet order  -consider neuro consult pending MRI result   Chest Pain Resolved. Heart score 4. Troponins neg. EKG unchanged from prior.  -telemetry  -repeat EKG in AM  -trend troponins   HTN  BP stable. Normotensive.  -resume home medications in AM, will be out of window for any permissive HTN    DVT prophylaxis: Lovenox  CODE STATUS: FULL   Consults called: None    Admission status: Observation   The medical decision making on  this patient was of high complexity and the patient is at high risk for clinical deterioration, therefore this is a level 3 admission.  Severity of Illness:      The appropriate patient status for this patient is OBSERVATION. Observation status is judged to be reasonable and necessary in order to provide the required intensity of service to ensure the patient's safety. The patient's presenting symptoms, physical exam findings, and initial radiographic and laboratory data in the context of their medical condition is felt to place them at decreased risk for further clinical deterioration. Furthermore, it is anticipated that the patient will be medically stable for discharge from the [facility] within 2 midnights of admission. The following factors support the patient status of observation.   " The patient's presenting symptoms include left facial numbness, jaw pain, chest pain. " The physical exam findings include left facial numbness. " The initial radiographic and laboratory data are neg CT head.     Time Spent on Admission:  38 minutes      Melina Schools D.O.  [facility] 05/03/2021, 9:22 PM

## 2021-05-03 NOTE — ED Notes (Signed)
Patient transported to X-ray 

## 2021-05-03 NOTE — ED Triage Notes (Signed)
Pt reports she was working from home, felt face and tongue went numb, jaw tightened up, felt confused, some blurry vision.  States she has had TIA's in the past.  She states she drove herself to the ED today.

## 2021-05-03 NOTE — ED Notes (Signed)
Telephone report given to Stevan Born with [facility].

## 2021-05-03 NOTE — ED Notes (Signed)
Pt assisted to BR, states she feels "dizzy", ambulates without difficulty.

## 2021-05-03 NOTE — ED Provider Notes (Signed)
[facility] EMERGENCY DEPARTMENT Provider Note   CSN: [id number] Arrival date & time: 05/03/21  1122     History Chief Complaint  Patient presents with  . Transient Ischemic Attack    Michelle Nash is a 55 y.o. female.  The history is provided by the patient and medical records.   Michelle Nash is a 55 y.o. female who presents to the Emergency Department complaining of multiple complaints.  She works from home and today at work she developed tightness across her entire lower jaw, left greater than right.  Sxs started about 1 hour ago.  She had associated numbness of entire tongue and she had trouble speaking.  She later developed chest fluttering with slight chest discomfort as well as mild HA.  She had tingling to her left jaw.  No current chest pain on ED arrival.    She was normal at 1015am.    No fever, cough, sob, abdominal pain, N/V/D, leg edema.    Has a hx/o PE five years ago following cholecystectomy, not currently on anticoagulation.      Past Medical History:  Diagnosis Date  . Coronary artery spasm ([place])   . Fibromyalgia   . Lupus ([place])   . Osteoarthritis   . Pulmonary emboli ([place])   . Sjogren's disease [facility])     Patient Active Problem List   Diagnosis Date Noted  . Synovitis of right knee 04/17/2021    Past Surgical History:  Procedure Laterality Date  . CESAREAN SECTION    . CHOLECYSTECTOMY       OB History    Gravida  4   Para  3   Term  3   Preterm      AB  1   Living        SAB  1   IAB      Ectopic      Multiple      Live Births              History reviewed. No pertinent family history.  Social History   Tobacco Use  . Smoking status: Never Smoker  . Smokeless tobacco: Never Used  Substance Use Topics  . Alcohol use: Never  . Drug use: Never    Home Medications Prior to Admission medications   Medication Sig Start Date End Date Taking? Authorizing Provider  cholecalciferol (VITAMIN D) 25 MCG (1000  UNIT) tablet 50,000 units once a week   Yes [provider]  cycloSPORINE (RESTASIS) 0.05 % ophthalmic emulsion Apply to eye. 12/20/19  Yes [provider]  diltiazem (CARDIZEM) 90 MG tablet Take 90 mg by mouth 4 (four) times daily.   Yes [provider]  Erenumab-aooe (AIMOVIG) 140 MG/ML SOAJ every 30 (thirty) days. 12/20/19  Yes [provider]  gabapentin (NEURONTIN) 600 MG tablet Take 600 mg by mouth 3 (three) times daily.   Yes [provider]  sertraline (ZOLOFT) 100 MG tablet Take 100 mg by mouth daily.   Yes [provider]  valsartan-hydrochlorothiazide (DIOVAN-HCT) 320-25 MG tablet Take 1 tablet by mouth daily.   Yes [provider]  cyclobenzaprine (FLEXERIL) 10 MG tablet Take 1 tablet by mouth at bedtime. 01/30/21   [provider]  HYDROcodone-acetaminophen (NORCO/VICODIN) 5-325 MG tablet Take 1 tablet by mouth every 4 (four) hours as needed. 02/12/21   Isla Pence, MD  losartan-hydrochlorothiazide (HYZAAR) 100-25 MG tablet Take 1 tablet by mouth daily. 01/30/21   [provider]    Allergies  Lisinopril  Review of Systems   Review of Systems  All other systems reviewed and are negative.   Physical Exam Updated Vital Signs BP 122/89   Pulse 69   Temp 98.9 F (37.2 C) (Oral)   Resp 17   Ht 5\' 7"  (1.702 m)   Wt 104.3 kg   SpO2 98%   BMI 36.02 kg/m   Physical Exam Vitals and nursing note reviewed.  Constitutional:      Appearance: She is well-developed.  HENT:     Head: Normocephalic and atraumatic.  Cardiovascular:     Rate and Rhythm: Normal rate and regular rhythm.     Heart sounds: No murmur heard.   Pulmonary:     Effort: Pulmonary effort is normal. No respiratory distress.     Breath sounds: Normal breath sounds.  Abdominal:     Palpations: Abdomen is soft.     Tenderness: There is no abdominal tenderness. There is no guarding or rebound.  Musculoskeletal:        General:  No tenderness.     Comments: 2+ DP pulses bilaterally  Skin:    General: Skin is warm and dry.  Neurological:     Mental Status: She is alert and oriented to person, place, and time.     Comments: EOMI, no asymmetry of facial movement.  Altered sensation to light touch in left lower face.  Visual fields grossly intact.  5/5 strength in all four extremities with sensation to light touch intact in all four extremities.    Psychiatric:        Behavior: Behavior normal.     ED Results / Procedures / Treatments   Labs (all labs ordered are listed, but only abnormal results are displayed) Labs Reviewed  COMPREHENSIVE METABOLIC PANEL - Abnormal; Notable for the following components:      Result Value   Total Protein 8.7 (*)    All other components within normal limits  CBC WITH DIFFERENTIAL/PLATELET - Abnormal; Notable for the following components:   WBC 3.2 (*)    All other components within normal limits  RESP PANEL BY RT-PCR (FLU A&B, COVID) [id number]  TROPONIN I (HIGH SENSITIVITY)  TROPONIN I (HIGH SENSITIVITY)    EKG EKG Interpretation  Date/Time:  Thursday May 03 2021 11:34:40 EDT Ventricular Rate:  71 PR Interval:  147 QRS Duration: 99 QT Interval:  428 QTC Calculation: 466 R Axis:   13 Text Interpretation: Sinus rhythm Nonspecific T abnormalities, anterior leads No significant change since last tracing Confirmed by Quintella Reichert [phone]) on 05/03/2021 11:37:38 AM   Radiology DG Chest 2 View  Result Date: 05/03/2021 CLINICAL DATA:  Chest pain. EXAM: CHEST - 2 VIEW COMPARISON:  CT angiogram chest 04/12/2021. Prior chest radiographs 02/12/2021. FINDINGS: Heart size within normal limits. No appreciable airspace consolidation. No evidence of pleural effusion or pneumothorax. No acute bony abnormality identified. IMPRESSION: No evidence of active cardiopulmonary disease. Electronically Signed   By: Kellie Simmering DO   On: 05/03/2021 12:33   CT Head Wo Contrast  Result Date:  05/03/2021 CLINICAL DATA:  Neuro deficit, acute, stroke suspected; left facial weakness, speech difficulties. Additional provided: Face and tongue numbness, jaw tightening, confusion, blurry vision. Patient reports history of TIAs. EXAM: CT HEAD WITHOUT CONTRAST TECHNIQUE: Contiguous axial images were obtained from the base of the skull through the vertex without intravenous contrast. COMPARISON:  No pertinent prior exams available for comparison. FINDINGS: Brain: Cerebral volume is normal. There is no acute intracranial hemorrhage. No demarcated  cortical infarct. No extra-axial fluid collection. No evidence of intracranial mass. No midline shift. Partially empty sella turcica. Vascular: No hyperdense vessel. Skull: Normal. Negative for fracture or focal lesion. Sinuses/Orbits: Visualized orbits show no acute finding. No significant paranasal sinus disease at the imaged levels. IMPRESSION: No evidence of acute intracranial abnormality. Partially empty sella turcica. This finding is very commonly incidental, but can be associated with idiopathic intracranial hypertension. Electronically Signed   By: Kellie Simmering DO   On: 05/03/2021 12:32    Procedures Procedures   Medications Ordered in ED Medications  metoCLOPramide (REGLAN) injection 10 mg (10 mg Intravenous Given 05/03/21 1519)    ED Course  I have reviewed the triage vital signs and the nursing notes.  Pertinent labs & imaging results that were available during my care of the patient were reviewed by me and considered in my medical decision making (see chart for details).    MDM Rules/Calculators/A&P                         Patient here for evaluation of paresthesias as well as chest pain that started earlier today. EKG is abnormal but similar when compared to priors, initial troponin is negative. She is neurologically intact on examination aside from altered sensation to light touch to the left lower face. Initial imaging is negative for acute  CVA. Given her complex medical history plan to obtain MRI to further evaluate her paresthesias. She has a hear score of four, given her chest pain plan to admit for further evaluation of her chest pain. Hospitalist consulted for admission for observation. Patient updated findings of studies recommendation for further workup and she is in agreement with treatment plan.   Final Clinical Impression(s) / ED Diagnoses Final diagnoses:  None    Rx / DC Orders ED Discharge Orders    None       Quintella Reichert, MD 05/03/21 1541

## 2021-05-04 ENCOUNTER — Observation Stay (HOSPITAL_COMMUNITY): Payer: 59

## 2021-05-04 ENCOUNTER — Observation Stay (HOSPITAL_BASED_OUTPATIENT_CLINIC_OR_DEPARTMENT_OTHER): Payer: 59

## 2021-05-04 DIAGNOSIS — I10 Essential (primary) hypertension: Secondary | ICD-10-CM | POA: Diagnosis not present

## 2021-05-04 DIAGNOSIS — Z79899 Other long term (current) drug therapy: Secondary | ICD-10-CM | POA: Diagnosis not present

## 2021-05-04 DIAGNOSIS — G459 Transient cerebral ischemic attack, unspecified: Secondary | ICD-10-CM | POA: Diagnosis not present

## 2021-05-04 DIAGNOSIS — Z20822 Contact with and (suspected) exposure to covid-19: Secondary | ICD-10-CM | POA: Diagnosis not present

## 2021-05-04 DIAGNOSIS — M329 Systemic lupus erythematosus, unspecified: Secondary | ICD-10-CM

## 2021-05-04 DIAGNOSIS — R079 Chest pain, unspecified: Secondary | ICD-10-CM

## 2021-05-04 DIAGNOSIS — R0789 Other chest pain: Secondary | ICD-10-CM | POA: Diagnosis not present

## 2021-05-04 DIAGNOSIS — R200 Anesthesia of skin: Secondary | ICD-10-CM

## 2021-05-04 LAB — BASIC METABOLIC PANEL
Anion gap: 8 (ref 5–15)
BUN: 13 mg/dL (ref 6–20)
CO2: 27 mmol/L (ref 22–32)
Calcium: 9.4 mg/dL (ref 8.9–10.3)
Chloride: 99 mmol/L (ref 98–111)
Creatinine, Ser: 0.76 mg/dL (ref 0.44–1.00)
GFR, Estimated: >60 mL/min (ref >60)
Glucose, Bld: 85 mg/dL (ref 70–99)
Potassium: 3.7 mmol/L (ref 3.5–5.1)
Sodium: 134 mmol/L — ABNORMAL LOW (ref 135–145)

## 2021-05-04 LAB — CBC
HCT: 38.7 % (ref 36.0–46.0)
Hemoglobin: 12.3 g/dL (ref 12.0–15.0)
MCH: 27.7 pg (ref 26.0–34.0)
MCHC: 31.8 g/dL (ref 30.0–36.0)
MCV: 87.2 fL (ref 80.0–100.0)
Platelets: 217 10*3/uL (ref 150–400)
RBC: 4.44 MIL/uL (ref 3.87–5.11)
RDW: 13.2 % (ref 11.5–15.5)
WBC: 3.1 10*3/uL — ABNORMAL LOW (ref 4.0–10.5)
nRBC: 0 % (ref 0.0–0.2)

## 2021-05-04 LAB — ECHOCARDIOGRAM COMPLETE
Area-P 1/2: 3.08 cm2
Height: 67 in
S' Lateral: 2.9 cm
Single Plane A4C EF: 54.3 %
Weight: 3680 oz

## 2021-05-04 MED ORDER — ASPIRIN EC 81 MG PO TBEC
81.0000 mg | DELAYED_RELEASE_TABLET | Freq: Every day | ORAL | Status: DC
  Administered 2021-05-04 – 2021-05-05 (×2): 81 mg via ORAL
  Filled 2021-05-04 (×2): qty 1

## 2021-05-04 MED ORDER — CYCLOBENZAPRINE HCL 10 MG PO TABS
10.0000 mg | ORAL_TABLET | Freq: Every day | ORAL | Status: DC
  Administered 2021-05-04: 10 mg via ORAL
  Filled 2021-05-04: qty 1

## 2021-05-04 MED ORDER — GADOBUTROL 1 MMOL/ML IV SOLN
10.0000 mL | Freq: Once | INTRAVENOUS | Status: AC | PRN
  Administered 2021-05-04: 10 mL via INTRAVENOUS

## 2021-05-04 NOTE — Progress Notes (Signed)
PROGRESS NOTE    Michelle Nash  [id number] DOB: 12-31-1965 DOA: 05/03/2021 PCP: Pcp, No    Chief Complaint  Patient presents with  . Transient Ischemic Attack    Brief Narrative:  Michelle Nash is 55 y.o. female with PMH SLE, OA, fibromyalgia, HTN who presents for jaw tightness, left >right, radiated into left side of neck. Symptoms started 1 hour prior to presenting to ER. At that time had associated numbness of entire tongue and difficulty with speech. She subsequently developed chest fluttering and mild chest pain. Last known normal time was 10:15 am. Has a hx/o PE five years ago following cholecystectomy, not currently on anticoagulation.  She was admitted for evaluation of the tongue numbness and facial numbness.  MRI brain is negative for acute stroke. Discussed with Dr Rory Percy, who suggested it could be her complex migraines vs fibromyalgia.  Pt seen and examined and discussed the results of the MRI of the brain and neurology recommendations. She wished to go home tomorrow,.   Assessment & Plan:   Active Problems:   Chest pain   TIA (transient ischemic attack)   Left facial numbness   Essential hypertension   Coronary artery spasm (HCC)   Sjogren's disease (HCC)   Osteoarthritis   Lupus ([place])   Atypical chest pressure, transient, not related to activity, spontaneous resolved.  Pt reports having some palpitations or fluttering sensation and not actual pain.  Recommend out patient follow up with cardiology for 30 day event monitor for further evaluation.  She currently denies any chest pain.  troponins negative.  EKG  Shows nonspecific t wave changes.  Monitor overnight on telemetry.  Echocardiogram shows preserved LVEF and grade 1 diastolic dysfunction.     Tongue numbness, Numbness of the lower part of the face on the left side, transient , resolved now.  Recurrent symptoms like this in the past was told she probably has TIA.  Currently asymptomatic.  Therapy  evaluation do not recommend any follow up.  MRI brain is negative for acute stroke.  Echo reviewed with the patient.  Carotid duplex does not show significant stenosis. After discussing with Dr Rory Percy, will start her on aspirin 81 mg daily.  A1c is pending LDL is 84.     Hypertension:  Well controlled BP parameters.    Lupus  Resume home meds.       DVT prophylaxis: (Lovenox) Code Status: (Full Code) Family Communication: none at bedside.  Disposition:   Status is: Observation  The patient will require care spanning > 2 midnights and should be moved to inpatient because: Ongoing diagnostic testing needed not appropriate for outpatient work up  Dispo: The patient is from: Home              Anticipated d/c is to: Home              Patient currently is not medically stable to d/c.   Difficult to place patient No       Consultants:   Phone consult with neurology.    Procedures: MRI brain  Echocardiogram   Carotid duplex.    Antimicrobials: None.    Subjective: No chest pain or sob. No tingling or numbness.   Objective: Vitals:   05/03/21 2018 05/04/21 0039 05/04/21 0645 05/04/21 1127  BP: 107/73 117/76 116/83 123/82  Pulse: 60 70 70 66  Resp: 17 17 16 18   Temp: 98.1 F (36.7 C) 98.2 F (36.8 C) 98.2 F (36.8 C) 98.1 F (36.7 C)  TempSrc:  Oral Oral Oral Oral  SpO2: 98% 99% 99% 99%  Weight:      Height:       No intake or output data in the 24 hours ending 05/04/21 1627 Filed Weights   05/03/21 1131  Weight: 104.3 kg    Examination:  General exam: Appears calm and comfortable  Respiratory system: Clear to auscultation. Respiratory effort normal. Cardiovascular system: S1 & S2 heard, RRR. No JVD, . No pedal edema. Gastrointestinal system: Abdomen is nondistended, soft and nontender. Normal bowel sounds heard. Central nervous system: Alert and oriented. No focal neurological deficits. Extremities: Symmetric 5 x 5 power. Skin: No rashes,  lesions or ulcers Psychiatry:  Mood & affect appropriate.     Data Reviewed: I have personally reviewed following labs and imaging studies  CBC: Recent Labs  Lab 05/03/21 1140 05/04/21 0227  WBC 3.2* 3.1*  NEUTROABS 1.7  --   HGB 13.7 12.3  HCT 43.0 38.7  MCV 88.5 87.2  PLT 231 [id number]    Basic Metabolic Panel: Recent Labs  Lab 05/03/21 1140 05/04/21 0227  NA 136 134*  K 3.8 3.7  CL 100 99  CO2 27 27  GLUCOSE 80 85  BUN 16 13  CREATININE 0.85 0.76  CALCIUM 9.3 9.4    GFR: Estimated Creatinine Clearance: 98.7 mL/min (by C-G formula based on SCr of 0.76 mg/dL).  Liver Function Tests: Recent Labs  Lab 05/03/21 1140  AST 19  ALT 12  ALKPHOS 74  BILITOT 0.3  PROT 8.7*  ALBUMIN 4.1    CBG: No results for input(s): GLUCAP in the last 168 hours.   Recent Results (from the past 240 hour(s))  Resp Panel by RT-PCR (Flu A&B, Covid) Nasopharyngeal Swab     Status: None   Collection Time: 05/03/21  3:13 PM   Specimen: Nasopharyngeal Swab; Nasopharyngeal(NP) swabs in vial transport medium  Result Value Ref Range Status   SARS Coronavirus 2 by RT PCR NEGATIVE NEGATIVE Final    Comment: (NOTE) SARS-CoV-2 target nucleic acids are NOT DETECTED.  The SARS-CoV-2 RNA is generally detectable in upper respiratory specimens during the acute phase of infection. The lowest concentration of SARS-CoV-2 viral copies this assay can detect is 138 copies/mL. A negative result does not preclude SARS-Cov-2 infection and should not be used as the sole basis for treatment or other patient management decisions. A negative result may occur with  improper specimen collection/handling, submission of specimen other than nasopharyngeal swab, presence of viral mutation(s) within the areas targeted by this assay, and inadequate number of viral copies(<138 copies/mL). A negative result must be combined with clinical observations, patient history, and epidemiological information. The expected  result is Negative.  Fact Sheet for Patients:  EntrepreneurPulse.com.au  Fact Sheet for Healthcare Providers:  IncredibleEmployment.be  This test is no t yet approved or cleared by the [place] FDA and  has been authorized for detection and/or diagnosis of SARS-CoV-2 by FDA under an Emergency Use Authorization (EUA). This EUA will remain  in effect (meaning this test can be used) for the duration of the COVID-19 declaration under Section 564(b)(1) of the Act, 21 U.S.C.section 360bbb-3(b)(1), unless the authorization is terminated  or revoked sooner.       Influenza A by PCR NEGATIVE NEGATIVE Final   Influenza B by PCR NEGATIVE NEGATIVE Final    Comment: (NOTE) The Xpert Xpress SARS-CoV-2/FLU/RSV plus assay is intended as an aid in the diagnosis of influenza from Nasopharyngeal swab specimens and should not be used as  a sole basis for treatment. Nasal washings and aspirates are unacceptable for Xpert Xpress SARS-CoV-2/FLU/RSV testing.  Fact Sheet for Patients: EntrepreneurPulse.com.au  Fact Sheet for Healthcare Providers: IncredibleEmployment.be  This test is not yet approved or cleared by the [place] FDA and has been authorized for detection and/or diagnosis of SARS-CoV-2 by FDA under an Emergency Use Authorization (EUA). This EUA will remain in effect (meaning this test can be used) for the duration of the COVID-19 declaration under Section 564(b)(1) of the Act, 21 U.S.C. section 360bbb-3(b)(1), unless the authorization is terminated or revoked.  Performed at [facility], [address], [place], [place] [address]          Radiology Studies: DG Chest 2 View  Result Date: 05/03/2021 CLINICAL DATA:  Chest pain. EXAM: CHEST - 2 VIEW COMPARISON:  CT angiogram chest 04/12/2021. Prior chest radiographs 02/12/2021. FINDINGS: Heart size within normal limits. No appreciable  airspace consolidation. No evidence of pleural effusion or pneumothorax. No acute bony abnormality identified. IMPRESSION: No evidence of active cardiopulmonary disease. Electronically Signed   By: Kellie Simmering DO   On: 05/03/2021 12:33   CT Head Wo Contrast  Result Date: 05/03/2021 CLINICAL DATA:  Neuro deficit, acute, stroke suspected; left facial weakness, speech difficulties. Additional provided: Face and tongue numbness, jaw tightening, confusion, blurry vision. Patient reports history of TIAs. EXAM: CT HEAD WITHOUT CONTRAST TECHNIQUE: Contiguous axial images were obtained from the base of the skull through the vertex without intravenous contrast. COMPARISON:  No pertinent prior exams available for comparison. FINDINGS: Brain: Cerebral volume is normal. There is no acute intracranial hemorrhage. No demarcated cortical infarct. No extra-axial fluid collection. No evidence of intracranial mass. No midline shift. Partially empty sella turcica. Vascular: No hyperdense vessel. Skull: Normal. Negative for fracture or focal lesion. Sinuses/Orbits: Visualized orbits show no acute finding. No significant paranasal sinus disease at the imaged levels. IMPRESSION: No evidence of acute intracranial abnormality. Partially empty sella turcica. This finding is very commonly incidental, but can be associated with idiopathic intracranial hypertension. Electronically Signed   By: Kellie Simmering DO   On: 05/03/2021 12:32   MR Brain W and Wo Contrast  Result Date: 05/04/2021 CLINICAL DATA:  55 year old female with left facial weakness, abnormal speech, face and tongue numbness, confusion. EXAM: MRI HEAD WITHOUT AND WITH CONTRAST TECHNIQUE: Multiplanar, multiecho pulse sequences of the brain and surrounding structures were obtained without and with intravenous contrast. CONTRAST:  17mL GADAVIST GADOBUTROL 1 MMOL/ML IV SOLN COMPARISON:  Head CT yesterday. FINDINGS: Brain: No restricted diffusion to suggest acute infarction. No  midline shift, mass effect, evidence of mass lesion, ventriculomegaly, extra-axial collection or acute intracranial hemorrhage. Cervicomedullary junction is within normal limits. Pituitary is felt to be within normal limits for age. Scattered small mostly subcortical white matter T2 and FLAIR hyperintense foci scattered in both hemispheres slightly greater on the right (series 11, image 16). No cortical encephalomalacia. No chronic cerebral blood products. Deep gray matter nuclei, brainstem and cerebellum are within normal limits. No abnormal enhancement identified. Vascular: Major intracranial vascular flow voids are preserved. The right vertebral artery appears dominant. The major dural venous sinuses are enhancing and appear to be patent. Skull and upper cervical spine: Negative visible cervical spine. Normal bone marrow signal. Sinuses/Orbits: Negative orbits. Paranasal sinuses and mastoids are stable and well aerated. Other: Visible internal auditory structures appear normal. Visible scalp and face soft tissues appear negative. IMPRESSION: 1. No acute intracranial abnormality. 2. Mild to moderate for age scattered  small cerebral white matter signal changes, nonspecific. Differential considerations include accelerated small vessel ischemia, sequelae of trauma, hypercoagulable state, vasculitis, migraines, prior infection or less likely demyelination. Electronically Signed   By: Genevie Ann M.D.   On: 05/04/2021 05:39   ECHOCARDIOGRAM COMPLETE  Result Date: 05/04/2021    ECHOCARDIOGRAM REPORT   Patient Name:   DAIRA HINE Date of Exam: 05/04/2021 Medical Rec #:  [id number]     Height:       67.0 in Accession #:    [id number]    Weight:       230.0 lb Date of Birth:  07-13-1966     BSA:          2.146 m Patient Age:    33 years      BP:           116/83 mmHg Patient Gender: F             HR:           60 bpm. Exam Location:  Inpatient Procedure: 2D Echo Indications:    TIA  History:        Patient has no prior  history of Echocardiogram examinations.                 Lupus. Sjogren's, Signs/Symptoms:Chest Pain; Risk                 Factors:Hypertension.  Sonographer:    Johny Chess Referring Phys: [id number] [place]  1. Left ventricular ejection fraction, by estimation, is 55 to 60%. The left ventricle has normal function. The left ventricle has no regional wall motion abnormalities. Left ventricular diastolic parameters are consistent with Grade I diastolic dysfunction (impaired relaxation).  2. Right ventricular systolic function is normal. The right ventricular size is normal. There is normal pulmonary artery systolic pressure. The estimated right ventricular systolic pressure is 41.6 mmHg.  3. The mitral valve is normal in structure. Trivial mitral valve regurgitation. No evidence of mitral stenosis.  4. The aortic valve is tricuspid. There is mild thickening of the aortic valve. Aortic valve regurgitation is not visualized. Mild aortic valve sclerosis is present, with no evidence of aortic valve stenosis.  5. The inferior vena cava is normal in size with greater than 50% respiratory variability, suggesting right atrial pressure of 3 mmHg. Comparison(s): No prior Echocardiogram. Conclusion(s)/Recommendation(s): No intracardiac source of embolism detected on this transthoracic study. A transesophageal echocardiogram is recommended to exclude cardiac source of embolism if clinically indicated. FINDINGS  Left Ventricle: Left ventricular ejection fraction, by estimation, is 55 to 60%. The left ventricle has normal function. The left ventricle has no regional wall motion abnormalities. The left ventricular internal cavity size was normal in size. There is  no left ventricular hypertrophy. Left ventricular diastolic parameters are consistent with Grade I diastolic dysfunction (impaired relaxation). Normal left ventricular filling pressure. Right Ventricle: The right ventricular size is normal.  No increase in right ventricular wall thickness. Right ventricular systolic function is normal. There is normal pulmonary artery systolic pressure. The tricuspid regurgitant velocity is 2.52 m/s, and  with an assumed right atrial pressure of 3 mmHg, the estimated right ventricular systolic pressure is 60.6 mmHg. Left Atrium: Left atrial size was normal in size. Right Atrium: Right atrial size was normal in size. Pericardium: There is no evidence of pericardial effusion. Mitral Valve: The mitral valve is normal in structure. There is mild thickening of the mitral valve leaflet(s). There is mild calcification  of the mitral valve leaflet(s). Mild mitral annular calcification. Trivial mitral valve regurgitation. No evidence  of mitral valve stenosis. Tricuspid Valve: The tricuspid valve is normal in structure. Tricuspid valve regurgitation is trivial. Aortic Valve: The aortic valve is tricuspid. There is mild thickening of the aortic valve. Aortic valve regurgitation is not visualized. Mild aortic valve sclerosis is present, with no evidence of aortic valve stenosis. Pulmonic Valve: The pulmonic valve was normal in structure. Pulmonic valve regurgitation is trivial. Aorta: The aortic root and ascending aorta are structurally normal, with no evidence of dilitation. Venous: The inferior vena cava is normal in size with greater than 50% respiratory variability, suggesting right atrial pressure of 3 mmHg. IAS/Shunts: No atrial level shunt detected by color flow Doppler.  LEFT VENTRICLE PLAX 2D LVIDd:         4.70 cm     Diastology LVIDs:         2.90 cm     LV e' medial:    8.27 cm/s LV PW:         0.90 cm     LV E/e' medial:  6.3 LV IVS:        0.90 cm     LV e' lateral:   9.36 cm/s LVOT diam:     2.00 cm     LV E/e' lateral: 5.6 LV SV:         66 LV SV Index:   31 LVOT Area:     3.14 cm  LV Volumes (MOD) LV vol d, MOD A4C: 84.7 ml LV vol s, MOD A4C: 38.7 ml LV SV MOD A4C:     84.7 ml RIGHT VENTRICLE             IVC RV S  prime:     15.00 cm/s  IVC diam: 1.30 cm TAPSE (M-mode): 1.4 cm LEFT ATRIUM             Index       RIGHT ATRIUM           Index LA diam:        4.00 cm 1.86 cm/m  RA Area:     12.00 cm LA Vol (A2C):   47.7 ml 22.23 ml/m RA Volume:   26.40 ml  12.30 ml/m LA Vol (A4C):   48.9 ml 22.79 ml/m LA Biplane Vol: 49.1 ml 22.88 ml/m  AORTIC VALVE LVOT Vmax:   107.00 cm/s LVOT Vmean:  67.300 cm/s LVOT VTI:    0.210 m  AORTA Ao Root diam: 2.80 cm Ao Asc diam:  3.00 cm MITRAL VALVE               TRICUSPID VALVE MV Area (PHT): 3.08 cm    TR Peak grad:   25.4 mmHg MV Decel Time: 246 msec    TR Vmax:        252.00 cm/s MV E velocity: 52.30 cm/s MV A velocity: 54.40 cm/s  SHUNTS MV E/A ratio:  0.96        Systemic VTI:  0.21 m                            Systemic Diam: 2.00 cm Gwyndolyn Kaufman MD Electronically signed by Gwyndolyn Kaufman MD Signature Date/Time: 05/04/2021/10:59:06 AM    Final    VAS US CAROTID  Result Date: 05/04/2021 Carotid Arterial Duplex Study Patient Name:  LARKIN ALFRED  Date of Exam:   05/04/2021 Medical Rec #:  [id number]      Accession #:    [id number] Date of Birth: 01/17/1966      Patient Gender: F Patient Age:   55Y Exam Location:  [facility] Procedure:      VAS US CAROTID Referring Phys: [id number] Nicolette Bang --------------------------------------------------------------------------------  Indications:       TIA. Risk Factors:      Hypertension. Comparison Study:  No prior studies available. Performing Technologist: Darlin Coco RDMS,RVT  Examination Guidelines: A complete evaluation includes B-mode imaging, spectral Doppler, color Doppler, and power Doppler as needed of all accessible portions of each vessel. Bilateral testing is considered an integral part of a complete examination. Limited examinations for reoccurring indications may be performed as noted.  Right Carotid Findings: +----------+--------+--------+--------+------------------+--------+           PSV cm/sEDV  cm/sStenosisPlaque DescriptionComments +----------+--------+--------+--------+------------------+--------+ CCA Prox  96      24                                         +----------+--------+--------+--------+------------------+--------+ CCA Distal88      27                                         +----------+--------+--------+--------+------------------+--------+ ICA Prox  58      21      1-39%   heterogenous               +----------+--------+--------+--------+------------------+--------+ ICA Distal122     51                                tortuous +----------+--------+--------+--------+------------------+--------+ ECA       66      14                                tortuous +----------+--------+--------+--------+------------------+--------+ +----------+--------+-------+----------------+-------------------+           PSV cm/sEDV cmsDescribe        Arm Pressure (mmHG) +----------+--------+-------+----------------+-------------------+ [id number]             Multiphasic, WNL                    +----------+--------+-------+----------------+-------------------+ +---------+--------+--+--------+--+---------+ VertebralPSV cm/s67EDV cm/s20Antegrade +---------+--------+--+--------+--+---------+  Left Carotid Findings: +----------+--------+--------+--------+------------------+--------+           PSV cm/sEDV cm/sStenosisPlaque DescriptionComments +----------+--------+--------+--------+------------------+--------+ CCA Prox  125     33                                         +----------+--------+--------+--------+------------------+--------+ CCA Distal95      32                                         +----------+--------+--------+--------+------------------+--------+ ICA Prox  68      35      1-39%   heterogenous               +----------+--------+--------+--------+------------------+--------+ ICA Distal79      37                                          +----------+--------+--------+--------+------------------+--------+  ECA       102     17                                tortuous +----------+--------+--------+--------+------------------+--------+ +----------+--------+--------+----------------+-------------------+           PSV cm/sEDV cm/sDescribe        Arm Pressure (mmHG) +----------+--------+--------+----------------+-------------------+ [id number]              Multiphasic, WNL                    +----------+--------+--------+----------------+-------------------+ +---------+--------+--+--------+--+---------+ VertebralPSV cm/s45EDV cm/s15Antegrade +---------+--------+--+--------+--+---------+   Summary: Right Carotid: Velocities in the right ICA are consistent with a 1-39% stenosis. Left Carotid: Velocities in the left ICA are consistent with a 1-39% stenosis. Vertebrals:  Bilateral vertebral arteries demonstrate antegrade flow. Subclavians: Normal flow hemodynamics were seen in bilateral subclavian              arteries. *See table(s) above for measurements and observations.     Preliminary         Scheduled Meds: . aspirin EC  81 mg Oral Daily  . diltiazem  90 mg Oral QID  . enoxaparin (LOVENOX) injection  40 mg Subcutaneous Q24H  . gabapentin  600 mg Oral TID  . irbesartan  300 mg Oral Daily   And  . hydrochlorothiazide  25 mg Oral Daily  . sertraline  100 mg Oral Daily   Continuous Infusions:   LOS: 0 days    Time spent: 36 minutes.     [name], MD [facility]   To contact the attending provider between 7A-7P or the covering provider during after hours 7P-7A, please log into the web site [email or web address] and access using universal [place] password for that web site. If you do not have the password, please call the hospital operator.  05/04/2021, 4:27 PM

## 2021-05-04 NOTE — Progress Notes (Signed)
OT Cancellation Note  Patient Details Name: Michelle Nash MRN: [id number] DOB: [birth year]   Cancelled Treatment:    Reason Eval/Treat Not Completed: OT screened, no needs identified, will sign off.  Pt with negative MRI, and per PT, pt is back to baseline.   [name]., OTR/L Acute Rehabilitation Services Pager [phone] Office [phone]   [name] 05/04/2021, 9:28 AM

## 2021-05-04 NOTE — Progress Notes (Signed)
  Echocardiogram 2D Echocardiogram has been performed.  Michelle Nash 05/04/2021, 8:51 AM

## 2021-05-04 NOTE — Evaluation (Signed)
Physical Therapy Evaluation - One-time Eval Patient Details Name: Michelle Nash MRN: [id number] DOB: Apr 27, 1966 Today'Nash Date: 05/04/2021   History of Present Illness  54 yo female presents to ED on 5/19 with facial numbness, jaw tightness L>R, blurry vision, AMS, subsequent chest pain. PMH includes fibromyalgia, lupus, OA, DVT/PE, sjogren'Nash disease, TIA.  Clinical Impression   Pt presents with [facility] strength, activity tolerance vs baseline, and balance. Pt participatory in hallway-distance gait without AD or PT assist, and navigated 18 steps which demonstrates pt ability to enter apt at d/c. Pt with x1 period of dizziness during step navigation, but resolves with rest and per pt this is normal due to getting winded on exertion. PT denies chest pain, facial numbness, or weakness during session. Pt is at baseline level of mobility, no further acute or follow up PT needs. PT to sign off, thank you.      Follow Up Recommendations No PT follow up    Equipment Recommendations  None recommended by PT    Recommendations for Other Services       Precautions / Restrictions Precautions Precautions: None Restrictions Weight Bearing Restrictions: No      Mobility  Bed Mobility Overal bed mobility: Independent                  Transfers Overall transfer level: Independent               General transfer comment: standing at mirror, washing up upon PT arrival  Ambulation/Gait Ambulation/Gait assistance: Modified independent (Device/Increase time) Gait Distance (Feet): 200 Feet Assistive device: None Gait Pattern/deviations: Step-through pattern;Decreased stride length Gait velocity: slightly decr initially, improving with further distance   General Gait Details: mod I for increased time, improving gait speed with further distance walked  Stairs Stairs: Yes Stairs assistance: Supervision Stair Management: One rail Left;Alternating pattern;Step to pattern;Forwards Number  of Stairs: 18 General stair comments: supervision for safety, step-to pattern with descending and alternating pattern with ascending. Slowed pace, but performed safely and pt reports this is baseline given chronic hip and knee pain.  Wheelchair Mobility    Modified Rankin (Stroke Patients Only)       Balance Overall balance assessment: Modified Independent (x1 fall in the last year due to swollen and painful knee)                               Standardized Balance Assessment Standardized Balance Assessment : Dynamic Gait Index   Dynamic Gait Index Level Surface: Normal Change in Gait Speed: Normal Gait with Horizontal Head Turns: Normal Gait with Vertical Head Turns: Normal Gait and Pivot Turn: Mild Impairment Step Over Obstacle: Mild Impairment Step Around Obstacles: Normal Steps: Mild Impairment Total Score: 21       Pertinent Vitals/Pain Pain Assessment: Faces Faces Pain Scale: No hurt Pain Intervention(Nash): Limited activity within patient'Nash tolerance;Monitored during session    Home Living Family/patient expects to be discharged to:: Private residence Living Arrangements: Alone Available Help at Discharge: Available PRN/intermittently Type of Home: Apartment Home Access: Stairs to enter Entrance Stairs-Rails: Left Entrance Stairs-Number of Steps: flight (18-20) Home Layout: One level Home Equipment: Walker - 2 wheels;Cane - single point      Prior Function Level of Independence: Independent         Comments: uses RW vs cane only if she is having difficult day with hips/knees (history of lupus)     Hand Dominance   Dominant Hand:  Right    Extremity/Trunk Assessment   Upper Extremity Assessment Upper Extremity Assessment: Overall WFL for tasks assessed    Lower Extremity Assessment Lower Extremity Assessment: Overall WFL for tasks assessed (5/5 bilat)    Cervical / Trunk Assessment Cervical / Trunk Assessment: Normal  Communication    Communication: No difficulties  Cognition Arousal/Alertness: Awake/alert Behavior During Therapy: WFL for tasks assessed/performed Overall Cognitive Status: Within Functional Limits for tasks assessed                                        General Comments      Exercises     Assessment/Plan    PT Assessment Patent does not need any further PT services  PT Problem List         PT Treatment Interventions      PT Goals (Current goals can be found in the Care Plan section)  Acute Rehab PT Goals Patient Stated Goal: go home PT Goal Formulation: With patient Time For Goal Achievement: 05/04/21 Potential to Achieve Goals: Good    Frequency     Barriers to discharge        Co-evaluation               AM-PAC PT "6 Clicks" Mobility  Outcome Measure Help needed turning from your back to your side while in a flat bed without using bedrails?: None Help needed moving from lying on your back to sitting on the side of a flat bed without using bedrails?: None Help needed moving to and from a bed to a chair (including a wheelchair)?: None Help needed standing up from a chair using your arms (e.g., wheelchair or bedside chair)?: None Help needed to walk in hospital room?: None Help needed climbing 3-5 steps with a railing? : A Little 6 Click Score: 23    End of Session   Activity Tolerance: Patient tolerated treatment well Patient left: in bed;with call bell/phone within reach Nurse Communication: Mobility status PT Visit Diagnosis: Other abnormalities of gait and mobility (R26.89)    Time: 3016-0109 PT Time Calculation (min) (ACUTE ONLY): 10 min   Charges:   PT Evaluation $PT Eval Low Complexity: 1 Low         Michelle Nash, PT DPT Acute Rehabilitation Services Pager [phone]  Office [phone]  Roxine Caddy E Ruffin Pyo 05/04/2021, 9:35 AM

## 2021-05-04 NOTE — Progress Notes (Signed)
Carotid duplex bilateral study completed.   Please see CV Proc for preliminary results.   Bethan Adamek, RDMS, RVT  

## 2021-05-05 DIAGNOSIS — I10 Essential (primary) hypertension: Secondary | ICD-10-CM | POA: Diagnosis not present

## 2021-05-05 DIAGNOSIS — R079 Chest pain, unspecified: Secondary | ICD-10-CM | POA: Diagnosis not present

## 2021-05-05 MED ORDER — ASPIRIN 81 MG PO TBEC: 81.0000 mg | DELAYED_RELEASE_TABLET | Freq: Every day | ORAL | 11 refills | Status: AC

## 2021-05-05 NOTE — Discharge Summary (Signed)
Physician Discharge Summary  Michelle Nash [id number] DOB: 1966-10-26 DOA: 05/03/2021  PCP: Pcp, No  Admit date: 05/03/2021 Discharge date: 05/05/2021  Admitted From: home.  Disposition:  Home.   Recommendations for Outpatient Follow-up:  1. Follow up with PCP in 1-2 weeks 2. Please obtain BMP/CBC in one week Please follow up with neurology and cardiology in 1 to 2 weeks.   Discharge Condition:stable.  CODE STATUS: full code.  Diet recommendation: Heart Healthy   Brief/Interim Summary: Michelle Nash y.o.female with PMH SLE, OA, fibromyalgia, HTN who presents for jaw tightness, left >right, radiated into left side of neck. Symptoms started 1 hour prior to presenting to ER. At that time had associated numbness of entire tongue and difficulty with speech. She subsequently developed chest fluttering and mild chest pain. Last known normal time was 10:15 am.Has a hx/o PE five years ago following cholecystectomy, not currently on anticoagulation.  She was admitted for evaluation of the tongue numbness and facial numbness.  MRI brain is negative for acute stroke. Discussed with Dr Rory Percy, who suggested it could be her complex migraines vs fibromyalgia.  Discharge Diagnoses:  Active Problems:   Chest pain   TIA (transient ischemic attack)   Left facial numbness   Essential hypertension   Coronary artery spasm (HCC)   Sjogren's disease (HCC)   Osteoarthritis   Lupus ([place])   Atypical chest pressure, transient, not related to activity, spontaneous resolved.  Pt reports having some palpitations or fluttering sensation and not actual pain.  Recommend out patient follow up with cardiology for 30 day event monitor for further evaluation.  She currently denies any chest pain.  troponins negative.  EKG  Shows nonspecific t wave changes.  Overnight telemetry does not show any arrhythmias. Recommend outpatient follow up with cardiology for a stress test and 30 day event monitor  placement.  Echocardiogram shows preserved LVEF and grade 1 diastolic dysfunction.     Tongue numbness, Numbness of the lower part of the face on the left side, transient , resolved now. Probably complex migraine.  Recurrent symptoms like this in the past was told she probably has TIA.  Currently asymptomatic.  Therapy evaluation do not recommend any follow up.  MRI brain is negative for acute stroke.  Echo reviewed with the patient.  Carotid duplex does not show significant stenosis. After discussing with Dr Rory Percy, will start her on aspirin 81 mg daily.  LDL is 84.     Hypertension:  Well controlled BP parameters.    Lupus  Resume home meds.      Discharge Instructions  Discharge Instructions    Ambulatory referral to Cardiology   Complete by: As directed    Ambulatory referral to Neurology   Complete by: As directed    An appointment is requested in approximately: 1 week.   Diet - low sodium heart healthy   Complete by: As directed    Discharge instructions   Complete by: As directed    Please follow up with neurology in one week.  Please follow up with Cardiology for an outpatient stress test.     Allergies as of 05/05/2021      Reactions   Lisinopril Cough   ACE cough      Medication List    STOP taking these medications   HYDROcodone-acetaminophen 5-325 MG tablet Commonly known as: NORCO/VICODIN     TAKE these medications   Aimovig 140 MG/ML Soaj Generic drug: Erenumab-aooe Inject 140 mg into the skin every 30 (thirty) days.  aspirin 81 MG EC tablet Take 1 tablet (81 mg total) by mouth daily. Swallow whole.   cyclobenzaprine 10 MG tablet Commonly known as: FLEXERIL Take 1 tablet by mouth at bedtime.   diltiazem 90 MG tablet Commonly known as: CARDIZEM Take 90 mg by mouth at bedtime.   gabapentin 600 MG tablet Commonly known as: NEURONTIN Take 600 mg by mouth 3 (three) times daily.   hydroxychloroquine 200 MG tablet Commonly  known as: PLAQUENIL Take by mouth.   Restasis 0.05 % ophthalmic emulsion Generic drug: cycloSPORINE Place 1 drop into both eyes 2 (two) times daily.   sertraline 100 MG tablet Commonly known as: ZOLOFT Take 100 mg by mouth at bedtime.   valsartan-hydrochlorothiazide 320-25 MG tablet Commonly known as: DIOVAN-HCT Take 1 tablet by mouth daily.   Vitamin D (Ergocalciferol) 1.25 MG (50000 UNIT) Caps capsule Commonly known as: DRISDOL Take 50,000 Units by mouth every 7 (seven) days.       Allergies  Allergen Reactions  . Lisinopril Cough    ACE cough    Consultations: None.  Procedures/Studies: DG Chest 2 View  Result Date: 05/03/2021 CLINICAL DATA:  Chest pain. EXAM: CHEST - 2 VIEW COMPARISON:  CT angiogram chest 04/12/2021. Prior chest radiographs 02/12/2021. FINDINGS: Heart size within normal limits. No appreciable airspace consolidation. No evidence of pleural effusion or pneumothorax. No acute bony abnormality identified. IMPRESSION: No evidence of active cardiopulmonary disease. Electronically Signed   By: Kellie Simmering DO   On: 05/03/2021 12:33   CT Head Wo Contrast  Result Date: 05/03/2021 CLINICAL DATA:  Neuro deficit, acute, stroke suspected; left facial weakness, speech difficulties. Additional provided: Face and tongue numbness, jaw tightening, confusion, blurry vision. Patient reports history of TIAs. EXAM: CT HEAD WITHOUT CONTRAST TECHNIQUE: Contiguous axial images were obtained from the base of the skull through the vertex without intravenous contrast. COMPARISON:  No pertinent prior exams available for comparison. FINDINGS: Brain: Cerebral volume is normal. There is no acute intracranial hemorrhage. No demarcated cortical infarct. No extra-axial fluid collection. No evidence of intracranial mass. No midline shift. Partially empty sella turcica. Vascular: No hyperdense vessel. Skull: Normal. Negative for fracture or focal lesion. Sinuses/Orbits: Visualized orbits show  no acute finding. No significant paranasal sinus disease at the imaged levels. IMPRESSION: No evidence of acute intracranial abnormality. Partially empty sella turcica. This finding is very commonly incidental, but can be associated with idiopathic intracranial hypertension. Electronically Signed   By: Kellie Simmering DO   On: 05/03/2021 12:32   CT Angio Chest PE W/Cm &/Or Wo Cm  Result Date: 04/12/2021 CLINICAL DATA:  PE suspected, low/intermediate prob, positive D-dimer History of pulmonary embolus. EXAM: CT ANGIOGRAPHY CHEST WITH CONTRAST TECHNIQUE: Multidetector CT imaging of the chest was performed using the standard protocol during bolus administration of intravenous contrast. Multiplanar CT image reconstructions and MIPs were obtained to evaluate the vascular anatomy. CONTRAST:  138mL OMNIPAQUE IOHEXOL 350 MG/ML SOLN COMPARISON:  Chest CTA 02/12/2021 FINDINGS: Cardiovascular: There are no filling defects within the pulmonary arteries to suggest pulmonary embolus. There is streak artifact from dense IV contrast in the SVC the partially obscures the right upper lobe pulmonary artery. Heart is normal in size. No pericardial effusion. No aortic dissection or acute aortic findings. Mediastinum/Nodes: No mediastinal or hilar adenopathy. Decompressed esophagus. No thyroid nodule. Lungs/Pleura: Minimal scarring in the dependent right lower lobe. No acute airspace disease. No pleural fluid or pulmonary edema. The trachea and central bronchi are patent. No pulmonary mass. Upper Abdomen: No acute  findings. Large volume of stool in the included colon. Musculoskeletal: There are no acute or suspicious osseous abnormalities. Review of the MIP images confirms the above findings. IMPRESSION: No pulmonary embolus or acute intrathoracic abnormality. Electronically Signed   By: Narda RutherfordMelanie  Sanford M.D.   On: 04/12/2021 19:01   MR Brain W and Wo Contrast  Result Date: 05/04/2021 CLINICAL DATA:  55 year old female with left  facial weakness, abnormal speech, face and tongue numbness, confusion. EXAM: MRI HEAD WITHOUT AND WITH CONTRAST TECHNIQUE: Multiplanar, multiecho pulse sequences of the brain and surrounding structures were obtained without and with intravenous contrast. CONTRAST:  10mL GADAVIST GADOBUTROL 1 MMOL/ML IV SOLN COMPARISON:  Head CT yesterday. FINDINGS: Brain: No restricted diffusion to suggest acute infarction. No midline shift, mass effect, evidence of mass lesion, ventriculomegaly, extra-axial collection or acute intracranial hemorrhage. Cervicomedullary junction is within normal limits. Pituitary is felt to be within normal limits for age. Scattered small mostly subcortical white matter T2 and FLAIR hyperintense foci scattered in both hemispheres slightly greater on the right (series 11, image 16). No cortical encephalomalacia. No chronic cerebral blood products. Deep gray matter nuclei, brainstem and cerebellum are within normal limits. No abnormal enhancement identified. Vascular: Major intracranial vascular flow voids are preserved. The right vertebral artery appears dominant. The major dural venous sinuses are enhancing and appear to be patent. Skull and upper cervical spine: Negative visible cervical spine. Normal bone marrow signal. Sinuses/Orbits: Negative orbits. Paranasal sinuses and mastoids are stable and well aerated. Other: Visible internal auditory structures appear normal. Visible scalp and face soft tissues appear negative. IMPRESSION: 1. No acute intracranial abnormality. 2. Mild to moderate for age scattered small cerebral white matter signal changes, nonspecific. Differential considerations include accelerated small vessel ischemia, sequelae of trauma, hypercoagulable state, vasculitis, migraines, prior infection or less likely demyelination. Electronically Signed   By: Odessa FlemingH  Hall M.D.   On: 05/04/2021 05:39   US Venous Img Lower Right (DVT Study)  Result Date: 04/12/2021 CLINICAL DATA:   Swelling/pain, history of DVT. EXAM: RIGHT LOWER EXTREMITY VENOUS DOPPLER ULTRASOUND TECHNIQUE: Gray-scale sonography with compression, as well as color and duplex ultrasound, were performed to evaluate the deep venous system(s) from the level of the common femoral vein through the popliteal and proximal calf veins. COMPARISON:  None. FINDINGS: VENOUS Normal compressibility of the common femoral, superficial femoral, and popliteal veins, as well as the visualized calf veins. Visualized portions of profunda femoral vein and great saphenous vein unremarkable. No filling defects to suggest DVT on grayscale or color Doppler imaging. Doppler waveforms show normal direction of venous flow, normal respiratory plasticity and response to augmentation. Limited views of the contralateral common femoral vein are unremarkable. IMPRESSION: No evidence of DVT. Electronically Signed   By: Feliberto HartsFrederick S Jones MD   On: 04/12/2021 17:29   DG Knee Complete 4 Views Right  Result Date: 04/12/2021 CLINICAL DATA:  Right knee pain and swelling for the past 2 weeks. No injury. EXAM: RIGHT KNEE - COMPLETE 4+ VIEW COMPARISON:  None. FINDINGS: No acute fracture or dislocation. Small to moderate joint effusion. Joint spaces are preserved. Tricompartmental marginal osteophytes. Soft tissues are unremarkable. IMPRESSION: 1. No acute osseous abnormality. Small to moderate joint effusion. 2. Mild tricompartmental osteoarthritis. Electronically Signed   By: Obie DredgeWilliam T Derry M.D.   On: 04/12/2021 16:47   ECHOCARDIOGRAM COMPLETE  Result Date: 05/04/2021    ECHOCARDIOGRAM REPORT   Patient Name:   Michelle Nash Date of Exam: 05/04/2021 Medical Rec #:  [id number]  Height:       67.0 in Accession #:    [id number]    Weight:       230.0 lb Date of Birth:  Oct 05, 1966     BSA:          2.146 m Patient Age:    14 years      BP:           116/83 mmHg Patient Gender: F             HR:           60 bpm. Exam Location:  Inpatient Procedure: 2D Echo  Indications:    TIA  History:        Patient has no prior history of Echocardiogram examinations.                 Lupus. Sjogren's, Signs/Symptoms:Chest Pain; Risk                 Factors:Hypertension.  Sonographer:    Johny Chess Referring Phys: [id number] [place]  1. Left ventricular ejection fraction, by estimation, is 55 to 60%. The left ventricle has normal function. The left ventricle has no regional wall motion abnormalities. Left ventricular diastolic parameters are consistent with Grade I diastolic dysfunction (impaired relaxation).  2. Right ventricular systolic function is normal. The right ventricular size is normal. There is normal pulmonary artery systolic pressure. The estimated right ventricular systolic pressure is 01.0 mmHg.  3. The mitral valve is normal in structure. Trivial mitral valve regurgitation. No evidence of mitral stenosis.  4. The aortic valve is tricuspid. There is mild thickening of the aortic valve. Aortic valve regurgitation is not visualized. Mild aortic valve sclerosis is present, with no evidence of aortic valve stenosis.  5. The inferior vena cava is normal in size with greater than 50% respiratory variability, suggesting right atrial pressure of 3 mmHg. Comparison(s): No prior Echocardiogram. Conclusion(s)/Recommendation(s): No intracardiac source of embolism detected on this transthoracic study. A transesophageal echocardiogram is recommended to exclude cardiac source of embolism if clinically indicated. FINDINGS  Left Ventricle: Left ventricular ejection fraction, by estimation, is 55 to 60%. The left ventricle has normal function. The left ventricle has no regional wall motion abnormalities. The left ventricular internal cavity size was normal in size. There is  no left ventricular hypertrophy. Left ventricular diastolic parameters are consistent with Grade I diastolic dysfunction (impaired relaxation). Normal left ventricular filling  pressure. Right Ventricle: The right ventricular size is normal. No increase in right ventricular wall thickness. Right ventricular systolic function is normal. There is normal pulmonary artery systolic pressure. The tricuspid regurgitant velocity is 2.52 m/s, and  with an assumed right atrial pressure of 3 mmHg, the estimated right ventricular systolic pressure is 93.2 mmHg. Left Atrium: Left atrial size was normal in size. Right Atrium: Right atrial size was normal in size. Pericardium: There is no evidence of pericardial effusion. Mitral Valve: The mitral valve is normal in structure. There is mild thickening of the mitral valve leaflet(s). There is mild calcification of the mitral valve leaflet(s). Mild mitral annular calcification. Trivial mitral valve regurgitation. No evidence  of mitral valve stenosis. Tricuspid Valve: The tricuspid valve is normal in structure. Tricuspid valve regurgitation is trivial. Aortic Valve: The aortic valve is tricuspid. There is mild thickening of the aortic valve. Aortic valve regurgitation is not visualized. Mild aortic valve sclerosis is present, with no evidence of aortic valve stenosis. Pulmonic Valve: The pulmonic valve was normal  in structure. Pulmonic valve regurgitation is trivial. Aorta: The aortic root and ascending aorta are structurally normal, with no evidence of dilitation. Venous: The inferior vena cava is normal in size with greater than 50% respiratory variability, suggesting right atrial pressure of 3 mmHg. IAS/Shunts: No atrial level shunt detected by color flow Doppler.  LEFT VENTRICLE PLAX 2D LVIDd:         4.70 cm     Diastology LVIDs:         2.90 cm     LV e' medial:    8.27 cm/s LV PW:         0.90 cm     LV E/e' medial:  6.3 LV IVS:        0.90 cm     LV e' lateral:   9.36 cm/s LVOT diam:     2.00 cm     LV E/e' lateral: 5.6 LV SV:         66 LV SV Index:   31 LVOT Area:     3.14 cm  LV Volumes (MOD) LV vol d, MOD A4C: 84.7 ml LV vol s, MOD A4C: 38.7 ml  LV SV MOD A4C:     84.7 ml RIGHT VENTRICLE             IVC RV S prime:     15.00 cm/s  IVC diam: 1.30 cm TAPSE (M-mode): 1.4 cm LEFT ATRIUM             Index       RIGHT ATRIUM           Index LA diam:        4.00 cm 1.86 cm/m  RA Area:     12.00 cm LA Vol (A2C):   47.7 ml 22.23 ml/m RA Volume:   26.40 ml  12.30 ml/m LA Vol (A4C):   48.9 ml 22.79 ml/m LA Biplane Vol: 49.1 ml 22.88 ml/m  AORTIC VALVE LVOT Vmax:   107.00 cm/s LVOT Vmean:  67.300 cm/s LVOT VTI:    0.210 m  AORTA Ao Root diam: 2.80 cm Ao Asc diam:  3.00 cm MITRAL VALVE               TRICUSPID VALVE MV Area (PHT): 3.08 cm    TR Peak grad:   25.4 mmHg MV Decel Time: 246 msec    TR Vmax:        252.00 cm/s MV E velocity: 52.30 cm/s MV A velocity: 54.40 cm/s  SHUNTS MV E/A ratio:  0.96        Systemic VTI:  0.21 m                            Systemic Diam: 2.00 cm Gwyndolyn Kaufman MD Electronically signed by Gwyndolyn Kaufman MD Signature Date/Time: 05/04/2021/10:59:06 AM    Final    VAS US CAROTID  Result Date: 05/04/2021 Carotid Arterial Duplex Study Patient Name:  Michelle Nash  Date of Exam:   05/04/2021 Medical Rec #: [id number]      Accession #:    [id number] Date of Birth: 1966/05/07      Patient Gender: F Patient Age:   31Y Exam Location:  [facility] Procedure:      VAS US CAROTID Referring Phys: [id number] Nicolette Bang --------------------------------------------------------------------------------  Indications:       TIA. Risk Factors:      Hypertension. Comparison Study:  No prior studies  available. Performing Technologist: Darlin Coco RDMS,RVT  Examination Guidelines: A complete evaluation includes B-mode imaging, spectral Doppler, color Doppler, and power Doppler as needed of all accessible portions of each vessel. Bilateral testing is considered an integral part of a complete examination. Limited examinations for reoccurring indications may be performed as noted.  Right Carotid Findings:  +----------+--------+--------+--------+------------------+--------+           PSV cm/sEDV cm/sStenosisPlaque DescriptionComments +----------+--------+--------+--------+------------------+--------+ CCA Prox  96      24                                         +----------+--------+--------+--------+------------------+--------+ CCA Distal88      27                                         +----------+--------+--------+--------+------------------+--------+ ICA Prox  58      21      1-39%   heterogenous               +----------+--------+--------+--------+------------------+--------+ ICA Distal122     51                                tortuous +----------+--------+--------+--------+------------------+--------+ ECA       66      14                                tortuous +----------+--------+--------+--------+------------------+--------+ +----------+--------+-------+----------------+-------------------+           PSV cm/sEDV cmsDescribe        Arm Pressure (mmHG) +----------+--------+-------+----------------+-------------------+ [id number]             Multiphasic, WNL                    +----------+--------+-------+----------------+-------------------+ +---------+--------+--+--------+--+---------+ VertebralPSV cm/s67EDV cm/s20Antegrade +---------+--------+--+--------+--+---------+  Left Carotid Findings: +----------+--------+--------+--------+------------------+--------+           PSV cm/sEDV cm/sStenosisPlaque DescriptionComments +----------+--------+--------+--------+------------------+--------+ CCA Prox  125     33                                         +----------+--------+--------+--------+------------------+--------+ CCA Distal95      32                                         +----------+--------+--------+--------+------------------+--------+ ICA Prox  68      35      1-39%   heterogenous                +----------+--------+--------+--------+------------------+--------+ ICA Distal79      37                                         +----------+--------+--------+--------+------------------+--------+ ECA       102     17  tortuous +----------+--------+--------+--------+------------------+--------+ +----------+--------+--------+----------------+-------------------+           PSV cm/sEDV cm/sDescribe        Arm Pressure (mmHG) +----------+--------+--------+----------------+-------------------+ [id number]              Multiphasic, WNL                    +----------+--------+--------+----------------+-------------------+ +---------+--------+--+--------+--+---------+ VertebralPSV cm/s45EDV cm/s15Antegrade +---------+--------+--+--------+--+---------+   Summary: Right Carotid: Velocities in the right ICA are consistent with a 1-39% stenosis. Left Carotid: Velocities in the left ICA are consistent with a 1-39% stenosis. Vertebrals:  Bilateral vertebral arteries demonstrate antegrade flow. Subclavians: Normal flow hemodynamics were seen in bilateral subclavian              arteries. *See table(s) above for measurements and observations.  Electronically signed by Deitra Mayo MD on 05/04/2021 at 8:20:00 PM.    Final        Subjective: No new complaints.   Discharge Exam: Vitals:   05/04/21 2001 05/05/21 0401  BP: 103/74 119/72  Pulse:    Resp: 18 17  Temp: 98.9 F (37.2 C) 98.3 F (36.8 C)  SpO2:     Vitals:   05/04/21 1127 05/04/21 1719 05/04/21 2001 05/05/21 0401  BP: 123/82 109/79 103/74 119/72  Pulse: 66     Resp: 18 18 18 17   Temp: 98.1 F (36.7 C) 98.8 F (37.1 C) 98.9 F (37.2 C) 98.3 F (36.8 C)  TempSrc: Oral  Oral Oral  SpO2: 99%     Weight:      Height:        General: Pt is alert, awake, not in acute distress Cardiovascular: RRR, S1/S2 +, no rubs, no gallops Respiratory: CTA bilaterally, no wheezing, no  rhonchi Abdominal: Soft, NT, ND, bowel sounds + Extremities: no edema, no cyanosis    The results of significant diagnostics from this hospitalization (including imaging, microbiology, ancillary and laboratory) are listed below for reference.     Microbiology: Recent Results (from the past 240 hour(s))  Resp Panel by RT-PCR (Flu A&B, Covid) Nasopharyngeal Swab     Status: None   Collection Time: 05/03/21  3:13 PM   Specimen: Nasopharyngeal Swab; Nasopharyngeal(NP) swabs in vial transport medium  Result Value Ref Range Status   SARS Coronavirus 2 by RT PCR NEGATIVE NEGATIVE Final    Comment: (NOTE) SARS-CoV-2 target nucleic acids are NOT DETECTED.  The SARS-CoV-2 RNA is generally detectable in upper respiratory specimens during the acute phase of infection. The lowest concentration of SARS-CoV-2 viral copies this assay can detect is 138 copies/mL. A negative result does not preclude SARS-Cov-2 infection and should not be used as the sole basis for treatment or other patient management decisions. A negative result may occur with  improper specimen collection/handling, submission of specimen other than nasopharyngeal swab, presence of viral mutation(s) within the areas targeted by this assay, and inadequate number of viral copies(<138 copies/mL). A negative result must be combined with clinical observations, patient history, and epidemiological information. The expected result is Negative.  Fact Sheet for Patients:  EntrepreneurPulse.com.au  Fact Sheet for Healthcare Providers:  IncredibleEmployment.be  This test is no t yet approved or cleared by the [place] FDA and  has been authorized for detection and/or diagnosis of SARS-CoV-2 by FDA under an Emergency Use Authorization (EUA). This EUA will remain  in effect (meaning this test can be used) for the duration of the COVID-19 declaration under Section 564(b)(1) of the Act,  21 U.S.C.section 360bbb-3(b)(1), unless the  authorization is terminated  or revoked sooner.       Influenza A by PCR NEGATIVE NEGATIVE Final   Influenza B by PCR NEGATIVE NEGATIVE Final    Comment: (NOTE) The Xpert Xpress SARS-CoV-2/FLU/RSV plus assay is intended as an aid in the diagnosis of influenza from Nasopharyngeal swab specimens and should not be used as a sole basis for treatment. Nasal washings and aspirates are unacceptable for Xpert Xpress SARS-CoV-2/FLU/RSV testing.  Fact Sheet for Patients: EntrepreneurPulse.com.au  Fact Sheet for Healthcare Providers: IncredibleEmployment.be  This test is not yet approved or cleared by the [place] FDA and has been authorized for detection and/or diagnosis of SARS-CoV-2 by FDA under an Emergency Use Authorization (EUA). This EUA will remain in effect (meaning this test can be used) for the duration of the COVID-19 declaration under Section 564(b)(1) of the Act, 21 U.S.C. section 360bbb-3(b)(1), unless the authorization is terminated or revoked.  Performed at [facility], [place] [place], [place] [address]      Labs: BNP (last 3 results) No results for input(s): BNP in the last 8760 hours. Basic Metabolic Panel: Recent Labs  Lab 05/03/21 1140 05/04/21 0227  NA 136 134*  K 3.8 3.7  CL 100 99  CO2 27 27  GLUCOSE 80 85  BUN 16 13  CREATININE 0.85 0.76  CALCIUM 9.3 9.4   Liver Function Tests: Recent Labs  Lab 05/03/21 1140  AST 19  ALT 12  ALKPHOS 74  BILITOT 0.3  PROT 8.7*  ALBUMIN 4.1   No results for input(s): LIPASE, AMYLASE in the last 168 hours. No results for input(s): AMMONIA in the last 168 hours. CBC: Recent Labs  Lab 05/03/21 1140 05/04/21 0227  WBC 3.2* 3.1*  NEUTROABS 1.7  --   HGB 13.7 12.3  HCT 43.0 38.7  MCV 88.5 87.2  PLT 231 217   Cardiac Enzymes: No results for input(s): CKTOTAL, CKMB, CKMBINDEX, TROPONINI in the last  168 hours. BNP: Invalid input(s): POCBNP CBG: No results for input(s): GLUCAP in the last 168 hours. D-Dimer No results for input(s): DDIMER in the last 72 hours. Hgb A1c No results for input(s): HGBA1C in the last 72 hours. Lipid Profile Recent Labs    05/03/21 2000  CHOL 170  HDL 79  LDLCALC 84  TRIG 33  CHOLHDL 2.2   Thyroid function studies No results for input(s): TSH, T4TOTAL, T3FREE, THYROIDAB in the last 72 hours.  Invalid input(s): FREET3 Anemia work up No results for input(s): VITAMINB12, FOLATE, FERRITIN, TIBC, IRON, RETICCTPCT in the last 72 hours. Urinalysis    Component Value Date/Time   COLORURINE STRAW (A) 05/25/2018 0151   APPEARANCEUR CLEAR 05/25/2018 0151   LABSPEC 1.004 (L) 05/25/2018 0151   PHURINE 6.0 05/25/2018 0151   GLUCOSEU NEGATIVE 05/25/2018 0151   HGBUR NEGATIVE 05/25/2018 0151   BILIRUBINUR NEGATIVE 05/25/2018 0151   KETONESUR NEGATIVE 05/25/2018 0151   PROTEINUR NEGATIVE 05/25/2018 0151   NITRITE NEGATIVE 05/25/2018 0151   LEUKOCYTESUR NEGATIVE 05/25/2018 0151   Sepsis Labs Invalid input(s): PROCALCITONIN,  WBC,  LACTICIDVEN Microbiology Recent Results (from the past 240 hour(s))  Resp Panel by RT-PCR (Flu A&B, Covid) Nasopharyngeal Swab     Status: None   Collection Time: 05/03/21  3:13 PM   Specimen: Nasopharyngeal Swab; Nasopharyngeal(NP) swabs in vial transport medium  Result Value Ref Range Status   SARS Coronavirus 2 by RT PCR NEGATIVE NEGATIVE Final    Comment: (NOTE) SARS-CoV-2 target nucleic acids are NOT DETECTED.  The SARS-CoV-2 RNA is generally detectable in upper respiratory specimens during the acute phase of infection. The lowest concentration of SARS-CoV-2 viral copies this assay can detect is 138 copies/mL. A negative result does not preclude SARS-Cov-2 infection and should not be used as the sole basis for treatment or other patient management decisions. A negative result may occur with  improper specimen  collection/handling, submission of specimen other than nasopharyngeal swab, presence of viral mutation(s) within the areas targeted by this assay, and inadequate number of viral copies(<138 copies/mL). A negative result must be combined with clinical observations, patient history, and epidemiological information. The expected result is Negative.  Fact Sheet for Patients:  EntrepreneurPulse.com.au  Fact Sheet for Healthcare Providers:  IncredibleEmployment.be  This test is no t yet approved or cleared by the [place] FDA and  has been authorized for detection and/or diagnosis of SARS-CoV-2 by FDA under an Emergency Use Authorization (EUA). This EUA will remain  in effect (meaning this test can be used) for the duration of the COVID-19 declaration under Section 564(b)(1) of the Act, 21 U.S.C.section 360bbb-3(b)(1), unless the authorization is terminated  or revoked sooner.       Influenza A by PCR NEGATIVE NEGATIVE Final   Influenza B by PCR NEGATIVE NEGATIVE Final    Comment: (NOTE) The Xpert Xpress SARS-CoV-2/FLU/RSV plus assay is intended as an aid in the diagnosis of influenza from Nasopharyngeal swab specimens and should not be used as a sole basis for treatment. Nasal washings and aspirates are unacceptable for Xpert Xpress SARS-CoV-2/FLU/RSV testing.  Fact Sheet for Patients: EntrepreneurPulse.com.au  Fact Sheet for Healthcare Providers: IncredibleEmployment.be  This test is not yet approved or cleared by the [place] FDA and has been authorized for detection and/or diagnosis of SARS-CoV-2 by FDA under an Emergency Use Authorization (EUA). This EUA will remain in effect (meaning this test can be used) for the duration of the COVID-19 declaration under Section 564(b)(1) of the Act, 21 U.S.C. section 360bbb-3(b)(1), unless the authorization is terminated or revoked.  Performed at [facility], [address] [place], [place] [address]      Time coordinating discharge:36 minutes.   SIGNED:   [name], MD  [facility]

## 2021-05-07 ENCOUNTER — Encounter: Payer: Self-pay | Admitting: Neurology

## 2021-05-15 ENCOUNTER — Ambulatory Visit: Payer: 59 | Admitting: Family Medicine

## 2021-05-15 NOTE — Progress Notes (Deleted)
  Marchia Diguglielmo - 55 y.o. female MRN [id number]  Date of birth: July 17, 1966  SUBJECTIVE:  Including CC & ROS.  No chief complaint on file.   Kemyra August is a 55 y.o. female that is  ***.  ***   Review of Systems See HPI   HISTORY: Past Medical, Surgical, Social, and Family History Reviewed & Updated per EMR.   Pertinent Historical Findings include:  Past Medical History:  Diagnosis Date  . Coronary artery spasm ([place])   . Fibromyalgia   . Lupus ([place])   . Osteoarthritis   . Pulmonary emboli ([place])   . Sjogren's disease [facility])     Past Surgical History:  Procedure Laterality Date  . CESAREAN SECTION    . CHOLECYSTECTOMY      No family history on file.  Social History   Socioeconomic History  . Marital status: Single    Spouse name: Not on file  . Number of children: Not on file  . Years of education: Not on file  . Highest education level: Not on file  Occupational History  . Not on file  Tobacco Use  . Smoking status: Never Smoker  . Smokeless tobacco: Never Used  Substance and Sexual Activity  . Alcohol use: Never  . Drug use: Never  . Sexual activity: Not on file  Other Topics Concern  . Not on file  Social History Narrative  . Not on file   Social Determinants of Health   Financial Resource Strain: Not on file  Food Insecurity: Not on file  Transportation Needs: Not on file  Physical Activity: Not on file  Stress: Not on file  Social Connections: Not on file  Intimate Partner Violence: Not on file     PHYSICAL EXAM:  VS: There were no vitals taken for this visit. Physical Exam Gen: NAD, alert, cooperative with exam, well-appearing MSK:  ***      ASSESSMENT & PLAN:   No problem-specific Assessment & Plan notes found for this encounter.

## 2021-05-16 ENCOUNTER — Encounter: Payer: Medicare Other | Attending: Internal Medicine | Primary: Internal Medicine

## 2021-05-20 DIAGNOSIS — E559 Vitamin D deficiency, unspecified: Secondary | ICD-10-CM

## 2021-05-20 DIAGNOSIS — M3219 Other organ or system involvement in systemic lupus erythematosus: Principal | ICD-10-CM

## 2021-05-24 DIAGNOSIS — E559 Vitamin D deficiency, unspecified: Principal | ICD-10-CM

## 2021-05-24 MED ORDER — VITAMIN D (ERGOCALCIFEROL) 1.25 MG (50000 UT) PO CAPS
3 refills
Start: 2021-05-24 — End: ?

## 2021-06-12 ENCOUNTER — Ambulatory Visit (INDEPENDENT_AMBULATORY_CARE_PROVIDER_SITE_OTHER): Payer: 59 | Admitting: Family Medicine

## 2021-06-12 ENCOUNTER — Encounter: Payer: Self-pay | Admitting: Family Medicine

## 2021-06-12 ENCOUNTER — Ambulatory Visit: Payer: Self-pay

## 2021-06-12 ENCOUNTER — Other Ambulatory Visit: Payer: Self-pay

## 2021-06-12 VITALS — BP 102/68 | Ht 67.0 in | Wt 220.0 lb

## 2021-06-12 DIAGNOSIS — M1711 Unilateral primary osteoarthritis, right knee: Secondary | ICD-10-CM

## 2021-06-12 MED ORDER — KETOROLAC TROMETHAMINE 30 MG/ML IJ SOLN
30.0000 mg | Freq: Once | INTRAMUSCULAR | Status: AC
  Administered 2021-06-12: 30 mg via INTRA_ARTICULAR

## 2021-06-12 NOTE — Progress Notes (Signed)
Medication Samples have been provided to the patient.  Drug name: Rayos       Strength: 5 mg        Qty: 1 box LOT: [id number]  Exp.Date: 06/2021  Dosing instructions: Take 1 at 10:00 pm.   The patient has been instructed regarding the correct time, dose, and frequency of taking this medication, including desired effects and most common side effects.   Michelle Nash 5:02 PM 06/12/2021

## 2021-06-12 NOTE — Patient Instructions (Signed)
Good to see you Please try ice as needed  Please send me a message in MyChart with any questions or updates.  We will let you know when the gel injection is in  --Dr. Raeford Razor

## 2021-06-12 NOTE — Progress Notes (Signed)
Briceida Rasberry - 55 y.o. female MRN [id number]  Date of birth: 1966/09/04  SUBJECTIVE:  Including CC & ROS.  No chief complaint on file.   Ceclia Koker is a 55 y.o. female that is presenting with acute exacerbation of her right knee pain.  She received some improvement with the steroid injection but has since weaned off.  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   Review of Systems See HPI   HISTORY: Past Medical, Surgical, Social, and Family History Reviewed & Updated per EMR.   Pertinent Historical Findings include:  Past Medical History:  Diagnosis Date   Coronary artery spasm ([facility])    Fibromyalgia    Lupus ([place])    Osteoarthritis    Pulmonary emboli ([facility])    Sjogren's disease ([place])     Past Surgical History:  Procedure Laterality Date   CESAREAN SECTION     CHOLECYSTECTOMY      History reviewed. No pertinent family history.  Social History   Socioeconomic History   Marital status: Single    Spouse name: Not on file   Number of children: Not on file   Years of education: Not on file   Highest education level: Not on file  Occupational History   Not on file  Tobacco Use   Smoking status: Never   Smokeless tobacco: Never  Substance and Sexual Activity   Alcohol use: Never   Drug use: Never   Sexual activity: Not on file  Other Topics Concern   Not on file  Social History Narrative   Not on file   Social Determinants of Health   Financial Resource Strain: Not on file  Food Insecurity: Not on file  Transportation Needs: Not on file  Physical Activity: Not on file  Stress: Not on file  Social Connections: Not on file  Intimate Partner Violence: Not on file     PHYSICAL EXAM:  VS: BP 102/68 (BP Location: Left Arm, Patient Position: Sitting, Cuff Size: Large)   Ht 5\' 7"  (1.702 m)   Wt  220 lb (99.8 kg)   BMI 34.46 kg/m  Physical Exam Gen: NAD, alert, cooperative with exam, well-appearing MSK:  Right knee: Minor effusion. Normal range of motion. Tenderness palpation of the medial joint space. Neurovascularly intact   Aspiration/Injection Procedure Note Daniella Dewberry [birth year]  Procedure: Aspiration and Injection Indications: Right knee pain  Procedure Details Consent: Risks of procedure as well as the alternatives and risks of each were explained to the (patient/caregiver).  Consent for procedure obtained. Time Out: Verified patient identification, verified procedure, site/side was marked, verified correct patient position, special equipment/implants available, medications/allergies/relevent history reviewed, required imaging and test results available.  Performed.  The area was cleaned with iodine and alcohol swabs.    The right knee superior lateral suprapatellar pouch was injected using 3 cc 1% lidocaine on a 25-gauge 1-1/2 inch needle.  An 18-gauge 1-1/2 inch needle was used to achieve aspiration.  The syringe was switched to mixture containing 1 cc's of 30 mg and Toradol cc's of 0.25% bupivacaine was injected.  Ultrasound was used. Images were obtained in long views showing the injection.     A sterile dressing was applied.  Patient did tolerate procedure well.     ASSESSMENT & PLAN:   Osteoarthritis Acute on chronic in nature.  MRI was  revealing for degenerative changes. -Counseled on home exercise therapy and supportive care. -Intra-articular Toradol injection today. -Pursue gel injection. -Provided Rayos sample. -Could consider PRP or Zilretta.

## 2021-06-13 NOTE — Assessment & Plan Note (Signed)
Acute on chronic in nature.  MRI was revealing for degenerative changes. -Counseled on home exercise therapy and supportive care. -Intra-articular Toradol injection today. -Pursue gel injection. -Provided Rayos sample. -Could consider PRP or Zilretta.

## 2021-06-20 DIAGNOSIS — M3219 Other organ or system involvement in systemic lupus erythematosus: Principal | ICD-10-CM

## 2021-06-20 DIAGNOSIS — M3500 Sicca syndrome, unspecified: Secondary | ICD-10-CM

## 2021-06-20 MED ORDER — HYDROXYCHLOROQUINE SULFATE 200 MG PO TABS
3 refills | 30.00 days | Status: CP
Start: 2021-06-20 — End: ?

## 2021-07-05 ENCOUNTER — Ambulatory Visit (INDEPENDENT_AMBULATORY_CARE_PROVIDER_SITE_OTHER): Payer: 59 | Admitting: Family Medicine

## 2021-07-05 ENCOUNTER — Encounter: Payer: Self-pay | Admitting: Family Medicine

## 2021-07-05 ENCOUNTER — Other Ambulatory Visit: Payer: Self-pay

## 2021-07-05 ENCOUNTER — Ambulatory Visit: Payer: Self-pay

## 2021-07-05 VITALS — BP 102/78 | Ht 67.0 in | Wt 220.0 lb

## 2021-07-05 DIAGNOSIS — M1711 Unilateral primary osteoarthritis, right knee: Secondary | ICD-10-CM

## 2021-07-05 NOTE — Progress Notes (Signed)
  Michelle Nash - 55 y.o. female MRN [id number]  Date of birth: [birth year]  SUBJECTIVE:  Including CC & ROS.  No chief complaint on file.   Michelle Nash is a 55 y.o. female that is here for gel injection.    Review of Systems See HPI   HISTORY: Past Medical, Surgical, Social, and Family History Reviewed & Updated per EMR.   Pertinent Historical Findings include:  Past Medical History:  Diagnosis Date   Coronary artery spasm ([facility])    Fibromyalgia    Lupus ([place])    Osteoarthritis    Pulmonary emboli ([facility])    Sjogren's disease ([place])     Past Surgical History:  Procedure Laterality Date   CESAREAN SECTION     CHOLECYSTECTOMY      History reviewed. No pertinent family history.  Social History   Socioeconomic History   Marital status: Single    Spouse name: Not on file   Number of children: Not on file   Years of education: Not on file   Highest education level: Not on file  Occupational History   Not on file  Tobacco Use   Smoking status: Never   Smokeless tobacco: Never  Substance and Sexual Activity   Alcohol use: Never   Drug use: Never   Sexual activity: Not on file  Other Topics Concern   Not on file  Social History Narrative   Not on file   Social Determinants of Health   Financial Resource Strain: Not on file  Food Insecurity: Not on file  Transportation Needs: Not on file  Physical Activity: Not on file  Stress: Not on file  Social Connections: Not on file  Intimate Partner Violence: Not on file     PHYSICAL EXAM:  VS: BP 102/78 (BP Location: Left Arm, Patient Position: Sitting, Cuff Size: Large)   Ht 5\' 7"  (1.702 m)   Wt 220 lb (99.8 kg)   BMI 34.46 kg/m  Physical Exam Gen: NAD, alert, cooperative with exam, well-appearing    Aspiration/Injection Procedure Note Michelle Nash 1966/01/24  Procedure: Injection Indications: right knee pain  Procedure Details Consent: Risks of procedure as well as the alternatives and risks of each  were explained to the (patient/caregiver).  Consent for procedure obtained. Time Out: Verified patient identification, verified procedure, site/side was marked, verified correct patient position, special equipment/implants available, medications/allergies/relevent history reviewed, required imaging and test results available.  Performed.  The area was cleaned with iodine and alcohol swabs.    The right knee superior lateral suprapatellar pouch was injected using 4 cc's of 1% lidocaine with a 21  2" needle.  The syringe was switched and a 60 mg/3 mL of Durolane was injected. Ultrasound was used. Images were obtained in  Long views showing the injection.    A sterile dressing was applied.  Patient did tolerate procedure well.     ASSESSMENT & PLAN:   Osteoarthritis Presenting for gel injection today.

## 2021-07-05 NOTE — Patient Instructions (Signed)
Good to see you Please try ice   Please send me a message in MyChart with any questions or updates.  Please see me back in 4 weeks or as needed if better.   --Dr. Raeford Razor

## 2021-07-05 NOTE — Assessment & Plan Note (Signed)
Presenting for gel injection today.

## 2021-07-12 ENCOUNTER — Ambulatory Visit: Payer: 59 | Admitting: Neurology

## 2021-07-12 ENCOUNTER — Ambulatory Visit: Payer: 59 | Admitting: Cardiology

## 2021-07-19 ENCOUNTER — Encounter: Payer: Medicare Other | Attending: Internal Medicine | Primary: Internal Medicine

## 2021-09-13 ENCOUNTER — Emergency Department (HOSPITAL_BASED_OUTPATIENT_CLINIC_OR_DEPARTMENT_OTHER)
Admission: EM | Admit: 2021-09-13 | Discharge: 2021-09-13 | Disposition: A | Discharge status: Home / Self Care | Payer: 59 | Attending: Emergency Medicine | Admitting: Emergency Medicine

## 2021-09-13 ENCOUNTER — Other Ambulatory Visit: Payer: Self-pay

## 2021-09-13 ENCOUNTER — Emergency Department (HOSPITAL_BASED_OUTPATIENT_CLINIC_OR_DEPARTMENT_OTHER): Payer: 59

## 2021-09-13 ENCOUNTER — Encounter (HOSPITAL_BASED_OUTPATIENT_CLINIC_OR_DEPARTMENT_OTHER): Payer: Self-pay

## 2021-09-13 DIAGNOSIS — I10 Essential (primary) hypertension: Secondary | ICD-10-CM | POA: Insufficient documentation

## 2021-09-13 DIAGNOSIS — R2232 Localized swelling, mass and lump, left upper limb: Secondary | ICD-10-CM | POA: Diagnosis present

## 2021-09-13 DIAGNOSIS — Z79899 Other long term (current) drug therapy: Secondary | ICD-10-CM | POA: Diagnosis not present

## 2021-09-13 DIAGNOSIS — Z7982 Long term (current) use of aspirin: Secondary | ICD-10-CM | POA: Insufficient documentation

## 2021-09-13 DIAGNOSIS — L03114 Cellulitis of left upper limb: Secondary | ICD-10-CM | POA: Diagnosis not present

## 2021-09-13 DIAGNOSIS — Z8673 Personal history of transient ischemic attack (TIA), and cerebral infarction without residual deficits: Secondary | ICD-10-CM | POA: Insufficient documentation

## 2021-09-13 MED ORDER — DOXYCYCLINE HYCLATE 100 MG PO CAPS: 100.0000 mg | ORAL_CAPSULE | Freq: Two times a day (BID) | ORAL | 0 refills | Status: AC

## 2021-09-13 NOTE — ED Provider Notes (Signed)
[facility] EMERGENCY DEPARTMENT Provider Note   CSN: [id number] Arrival date & time: 09/13/21  4010     History Chief Complaint  Patient presents with   Arm Swelling    Michelle Nash is a 55 y.o. female.  The history is provided by the patient and medical records. No language interpreter was used.  Rash Location:  Shoulder/arm Shoulder/arm rash location:  L upper arm Quality: itchiness, painful, redness and swelling   Quality: not blistering, not bruising, not burning, not draining and not peeling   Pain details:    Quality:  Aching   Severity:  Moderate   Onset quality:  Gradual   Duration:  2 days   Timing:  Constant   Progression:  Worsening Onset quality:  Gradual Timing:  Constant Progression:  Worsening Relieved by:  Nothing Worsened by:  Nothing Ineffective treatments:  None tried Associated symptoms: no abdominal pain, no diarrhea, no fatigue, no fever, no headaches, no joint pain, no nausea, no shortness of breath, not vomiting and not wheezing       Past Medical History:  Diagnosis Date   Coronary artery spasm (HCC)    Fibromyalgia    Lupus (HCC)    Osteoarthritis    Pulmonary emboli (HCC)    Sjogren's disease ([place])     Patient Active Problem List   Diagnosis Date Noted   Chest pain 05/03/2021   TIA (transient ischemic attack) 05/03/2021   Left facial numbness 05/03/2021   Essential hypertension 05/03/2021   Coronary artery spasm (HCC)    Sjogren's disease (HCC)    Osteoarthritis    Lupus (HCC)    Synovitis of right knee 04/17/2021    Past Surgical History:  Procedure Laterality Date   CESAREAN SECTION     CHOLECYSTECTOMY       OB History     Gravida  4   Para  3   Term  3   Preterm      AB  1   Living         SAB  1   IAB      Ectopic      Multiple      Live Births              No family history on file.  Social History   Tobacco Use   Smoking status: Never   Smokeless tobacco: Never   Substance Use Topics   Alcohol use: Never   Drug use: Never    Home Medications Prior to Admission medications   Medication Sig Start Date End Date Taking? Authorizing Provider  cyclobenzaprine (FLEXERIL) 10 MG tablet Take 1 tablet by mouth at bedtime. 01/30/21  Yes [provider]  cycloSPORINE (RESTASIS) 0.05 % ophthalmic emulsion Place 1 drop into both eyes 2 (two) times daily. 12/20/19  Yes [provider]  diltiazem (CARDIZEM) 90 MG tablet Take 90 mg by mouth at bedtime.   Yes [provider]  Erenumab-aooe (AIMOVIG) 140 MG/ML SOAJ Inject 140 mg into the skin every 30 (thirty) days. 12/20/19  Yes [provider]  gabapentin (NEURONTIN) 600 MG tablet Take 600 mg by mouth 3 (three) times daily.   Yes [provider]  hydroxychloroquine (PLAQUENIL) 200 MG tablet Take by mouth. 04/19/21  Yes [provider]  sertraline (ZOLOFT) 100 MG tablet Take 100 mg by mouth at bedtime.   Yes [provider]  valsartan-hydrochlorothiazide (DIOVAN-HCT) 320-25 MG tablet Take 1 tablet by mouth daily.   Yes  [provider]  aspirin EC 81 MG EC tablet Take 1 tablet (81 mg total) by mouth daily. Swallow whole. 05/05/21   [name], MD  Vitamin D, Ergocalciferol, (DRISDOL) 1.25 MG (50000 UNIT) CAPS capsule Take 50,000 Units by mouth every 7 (seven) days.    [provider]    Allergies    Lisinopril  Review of Systems   Review of Systems  Constitutional:  Negative for chills, fatigue and fever.  HENT:  Negative for congestion.   Respiratory:  Negative for cough, chest tightness, shortness of breath and wheezing.   Cardiovascular:  Negative for chest pain, palpitations and leg swelling.  Gastrointestinal:  Negative for abdominal pain, diarrhea, nausea and vomiting.  Genitourinary:  Negative for dysuria and flank pain.  Musculoskeletal:  Negative for arthralgias and back pain.  Skin:  Positive for rash. Negative for wound.   Neurological:  Negative for light-headedness and headaches.  Psychiatric/Behavioral:  Negative for agitation.   All other systems reviewed and are negative.  Physical Exam Updated Vital Signs BP 117/82   Pulse 84   Temp 98.6 F (37 C) (Oral)   Resp 20   Ht 5\' 7"  (1.702 m)   Wt 104.3 kg   SpO2 98%   BMI 36.02 kg/m   Physical Exam Vitals and nursing note reviewed.  Constitutional:      General: She is not in acute distress.    Appearance: She is well-developed. She is not ill-appearing, toxic-appearing or diaphoretic.  HENT:     Head: Normocephalic and atraumatic.     Nose: No congestion.  Eyes:     Conjunctiva/sclera: Conjunctivae normal.  Cardiovascular:     Rate and Rhythm: Normal rate and regular rhythm.     Pulses: Normal pulses.     Heart sounds: No murmur heard. Pulmonary:     Effort: Pulmonary effort is normal. No respiratory distress.     Breath sounds: Normal breath sounds. No wheezing, rhonchi or rales.  Chest:     Chest wall: No tenderness.  Abdominal:     General: Abdomen is flat.     Palpations: Abdomen is soft.     Tenderness: There is no abdominal tenderness. There is no guarding or rebound.  Musculoskeletal:        General: Swelling and tenderness present.     Left upper arm: Swelling and tenderness present.     Cervical back: Neck supple. No tenderness.     Comments: Tenderness and erythema to the left upper arm that spares the elbow and has no pain with pronation and supination of the wrist.  Good pulses, strength, and sensation distally.  No swelling in the forearm or hand.  No tenderness in the shoulder or with range of motion of the shoulder.  No abscess seen on bedside ultrasound.  Skin:    General: Skin is warm and dry.     Capillary Refill: Capillary refill takes less than 2 seconds.     Coloration: Skin is not pale.     Findings: Erythema present.  Neurological:     General: No focal deficit present.     Mental Status: She is alert and  oriented to person, place, and time. Mental status is at baseline.     Sensory: No sensory deficit.     Motor: No weakness.  Psychiatric:        Mood and Affect: Mood normal.    ED Results / Procedures / Treatments   Labs (all labs ordered are listed,  but only abnormal results are displayed) Labs Reviewed - No data to display  EKG None  Radiology DG Humerus Left  Result Date: 09/13/2021 CLINICAL DATA:  55 year old female with left arm swelling for 3 days. Redness, pain, warmth. EXAM: LEFT HUMERUS - 2+ VIEW COMPARISON:  None. FINDINGS: Bone mineralization is within normal limits. There is no evidence of fracture or other focal bone lesions. Maintained alignment at the left shoulder and elbow. No evidence of elbow joint effusion. Subcutaneous soft tissue stranding, primarily in the distal arm and tracking toward the elbow. No soft tissue gas. No other discrete soft tissue abnormality. IMPRESSION: Evidence of soft tissue swelling/inflammation. No osseous abnormality identified. Electronically Signed   By: Genevie Ann M.D.   On: 09/13/2021 10:01    Procedures Procedures   EMERGENCY DEPARTMENT US SOFT TISSUE INTERPRETATION "Study: Limited Soft Tissue Ultrasound"  INDICATIONS: Soft tissue infection Multiple views of the body part were obtained in real-time with a multi-frequency linear probe  PERFORMED BY: Myself IMAGES ARCHIVED?: No SIDE:Left BODY PART:Upper extremity INTERPRETATION:  No abcess noted and Cellulitis present    Medications Ordered in ED Medications - No data to display  ED Course  I have reviewed the triage vital signs and the nursing notes.  Pertinent labs & imaging results that were available during my care of the patient were reviewed by me and considered in my medical decision making (see chart for details).    MDM Rules/Calculators/A&P                           Wai Minotti is a 55 y.o. female with a past medical history significant for lupus, Sjogren's  disease, coronary spasm, for myalgia, arthritis, and previous pulmonary embolism who presents with left upper arm redness and pain.  Patient reports that several days ago she thought she was bit by an insect of some kind and had some itching on the left upper arm.  She reports that her last few days it has worsened and now has some redness wrapping around the left upper arm.  She denies any swelling distal to the elbow and denies any pain in the wrist or forearm.  She denies any numbness, tingling, or weakness distally and reports no pain in the elbow aside from when she tries to bend it and stretches the erythematous part.  She denies any shoulder pain or chest pain.  Denies fevers, chills congestion, cough, or other systemic complaints.  Patient is concerned it could be a skin infection developing.  She denies history of abscesses or boils and reports that the felt her previous DVT was from her.  On exam, lungs are clear and chest is nontender.  Abdomen is nontender.  Good pulses in all extremities.  Patient has warmth, tenderness, and erythema to the left upper arm proximal to the elbow and distal to the shoulder.  There is no warmth or redness distal to the elbow and she has intact sensation, strength, and pulses distally.  Patient has no pain with wrist pronation and supination which can sometimes elicit pain if there was a joint infection in the elbow.  A bedside ultrasound was utilized and we did not see any evidence of abscess.  There was no fluctuance.  There was cobblestoning consistent with cellulitis.  Patient had an x-ray from triage which showed soft tissue swelling but no evidence of subcutaneous gas or any bony involvement.  Had a shared decision-making conversation with patient about work-up.  Clinically I suspect the cellulitis has developed.  Given her history of PE, we discussed the possibility of this being an atypical presentation for a clot however, given the lack of swelling more  distally, the suspected skin violation with a insect bite, and the distribution of her redness and discomfort I am worse concerned about cellulitis then clot.  We offered ultrasound but she does not want to wait the time it will take until the ultrasound team arrives today for evaluation.  Patient is amenable to getting antibiotics and following up with a PCP.  We discussed the antibiotic choice with pharmacy who feels the doxycycline is the best given her other medications.  Patient will take the doxycycline and follow-up with the PCP and understands return precautions for any new or worsened symptoms.  She no questions or concerns and was discharged in good condition   Final Clinical Impression(s) / ED Diagnoses Final diagnoses:  Cellulitis of left upper extremity    Rx / DC Orders ED Discharge Orders          Ordered    doxycycline (VIBRAMYCIN) 100 MG capsule  2 times daily        09/13/21 1120           Clinical Impression: 1. Cellulitis of left upper extremity     Disposition: Discharge  Condition: Good  I have discussed the results, Dx and Tx plan with the pt(& family if present). He/she/they expressed understanding and agree(s) with the plan. Discharge instructions discussed at great length. Strict return precautions discussed and pt &/or family have verbalized understanding of the instructions. No further questions at time of discharge.    New Prescriptions   DOXYCYCLINE (VIBRAMYCIN) 100 MG CAPSULE    Take 1 capsule (100 mg total) by mouth 2 (two) times daily for 7 days.    Follow Up: [place] [id number] [phone] Schedule an appointment as soon as possible for a visit    [facility] [address] [id number] [facility] [place] [phone]       Mozes Sagar, Gwenyth Allegra, MD 09/13/21 [phone]

## 2021-09-13 NOTE — ED Notes (Signed)
Unable to ascertain veins using ultrasound for IV site at this time.

## 2021-09-13 NOTE — ED Triage Notes (Signed)
Pt c/o L sided arm swelling  around humerus since Monday associated with warmth, itching. Pt states she thinks she might have been bit by something, but unknown.

## 2021-09-13 NOTE — Discharge Instructions (Signed)
Your history and exam today are consistent with a cellulitis of the left upper arm that seems to be worsening.  The x-ray did not show bony involvement or any soft tissue gas and your exam was otherwise reassuring and did not appear consistent with joint infection or abscess.  Please take the antibiotics which we discussed with pharmacy and they report are safe for you to take.  Please rest and stay hydrated.  If any symptoms are to change or worsen, please return to the nearest emergency department.  Please follow-up with a primary doctor.

## 2021-10-17 ENCOUNTER — Emergency Department (HOSPITAL_BASED_OUTPATIENT_CLINIC_OR_DEPARTMENT_OTHER)
Admission: EM | Admit: 2021-10-17 | Discharge: 2021-10-17 | Disposition: A | Discharge status: Home / Self Care | Payer: 59 | Attending: Emergency Medicine | Admitting: Emergency Medicine

## 2021-10-17 ENCOUNTER — Encounter (HOSPITAL_BASED_OUTPATIENT_CLINIC_OR_DEPARTMENT_OTHER): Payer: Self-pay

## 2021-10-17 ENCOUNTER — Other Ambulatory Visit: Payer: Self-pay

## 2021-10-17 ENCOUNTER — Emergency Department (HOSPITAL_BASED_OUTPATIENT_CLINIC_OR_DEPARTMENT_OTHER): Payer: 59

## 2021-10-17 DIAGNOSIS — R110 Nausea: Secondary | ICD-10-CM | POA: Insufficient documentation

## 2021-10-17 DIAGNOSIS — Z79899 Other long term (current) drug therapy: Secondary | ICD-10-CM | POA: Insufficient documentation

## 2021-10-17 DIAGNOSIS — Z7982 Long term (current) use of aspirin: Secondary | ICD-10-CM | POA: Insufficient documentation

## 2021-10-17 DIAGNOSIS — I10 Essential (primary) hypertension: Secondary | ICD-10-CM | POA: Insufficient documentation

## 2021-10-17 DIAGNOSIS — Z20822 Contact with and (suspected) exposure to covid-19: Secondary | ICD-10-CM | POA: Diagnosis not present

## 2021-10-17 DIAGNOSIS — R079 Chest pain, unspecified: Secondary | ICD-10-CM | POA: Insufficient documentation

## 2021-10-17 DIAGNOSIS — M791 Myalgia, unspecified site: Secondary | ICD-10-CM | POA: Insufficient documentation

## 2021-10-17 DIAGNOSIS — R509 Fever, unspecified: Secondary | ICD-10-CM | POA: Insufficient documentation

## 2021-10-17 DIAGNOSIS — R109 Unspecified abdominal pain: Secondary | ICD-10-CM | POA: Diagnosis not present

## 2021-10-17 DIAGNOSIS — Z8673 Personal history of transient ischemic attack (TIA), and cerebral infarction without residual deficits: Secondary | ICD-10-CM | POA: Diagnosis not present

## 2021-10-17 LAB — BASIC METABOLIC PANEL
Anion gap: 10 (ref 5–15)
BUN: 8 mg/dL (ref 6–20)
CO2: 25 mmol/L (ref 22–32)
Calcium: 9.4 mg/dL (ref 8.9–10.3)
Chloride: 103 mmol/L (ref 98–111)
Creatinine, Ser: 0.85 mg/dL (ref 0.44–1.00)
GFR, Estimated: >60 mL/min (ref >60)
Glucose, Bld: 82 mg/dL (ref 70–99)
Potassium: 3.7 mmol/L (ref 3.5–5.1)
Sodium: 138 mmol/L (ref 135–145)

## 2021-10-17 LAB — CBC WITH DIFFERENTIAL/PLATELET
Abs Immature Granulocytes: 0 10*3/uL (ref 0.00–0.07)
Basophils Absolute: 0 10*3/uL (ref 0.0–0.1)
Basophils Relative: 1 %
Eosinophils Absolute: 0 10*3/uL (ref 0.0–0.5)
Eosinophils Relative: 2 %
HCT: 39.5 % (ref 36.0–46.0)
Hemoglobin: 12.6 g/dL (ref 12.0–15.0)
Immature Granulocytes: 0 %
Lymphocytes Relative: 61 %
Lymphs Abs: 1.1 10*3/uL (ref 0.7–4.0)
MCH: 27.2 pg (ref 26.0–34.0)
MCHC: 31.9 g/dL (ref 30.0–36.0)
MCV: 85.3 fL (ref 80.0–100.0)
Monocytes Absolute: 0.2 10*3/uL (ref 0.1–1.0)
Monocytes Relative: 9 %
Neutro Abs: 0.5 10*3/uL — ABNORMAL LOW (ref 1.7–7.7)
Neutrophils Relative %: 27 %
Platelets: 223 10*3/uL (ref 150–400)
RBC: 4.63 MIL/uL (ref 3.87–5.11)
RDW: 13.5 % (ref 11.5–15.5)
Smear Review: NORMAL
WBC: 1.7 10*3/uL — ABNORMAL LOW (ref 4.0–10.5)
nRBC: 0 % (ref 0.0–0.2)

## 2021-10-17 LAB — RESP PANEL BY RT-PCR (FLU A&B, COVID) ARPGX2
Influenza A by PCR: NEGATIVE
Influenza B by PCR: NEGATIVE
SARS Coronavirus 2 by RT PCR: NEGATIVE

## 2021-10-17 LAB — URINALYSIS, ROUTINE W REFLEX MICROSCOPIC
Bilirubin Urine: NEGATIVE
Glucose, UA: NEGATIVE mg/dL
Hgb urine dipstick: NEGATIVE
Ketones, ur: NEGATIVE mg/dL
Leukocytes,Ua: NEGATIVE
Nitrite: NEGATIVE
Protein, ur: NEGATIVE mg/dL
Specific Gravity, Urine: 1.02 (ref 1.005–1.030)
pH: 6.5 (ref 5.0–8.0)

## 2021-10-17 LAB — TROPONIN I (HIGH SENSITIVITY): Troponin I (High Sensitivity): 3 ng/L (ref <18)

## 2021-10-17 MED ORDER — ONDANSETRON HCL 4 MG/2ML IJ SOLN
4.0000 mg | Freq: Once | INTRAMUSCULAR | Status: AC
  Administered 2021-10-17: 4 mg via INTRAVENOUS
  Filled 2021-10-17: qty 2

## 2021-10-17 MED ORDER — HYDROMORPHONE HCL 1 MG/ML IJ SOLN
2.0000 mg | Freq: Once | INTRAMUSCULAR | Status: AC
  Administered 2021-10-17: 2 mg via INTRAVENOUS
  Filled 2021-10-17: qty 2

## 2021-10-17 MED ORDER — MORPHINE SULFATE (PF) 4 MG/ML IV SOLN
4.0000 mg | Freq: Once | INTRAVENOUS | Status: AC
  Administered 2021-10-17: 4 mg via INTRAVENOUS
  Filled 2021-10-17: qty 1

## 2021-10-17 MED ORDER — SODIUM CHLORIDE 0.9 % IV BOLUS
1000.0000 mL | Freq: Once | INTRAVENOUS | Status: AC
  Administered 2021-10-17: 1000 mL via INTRAVENOUS

## 2021-10-17 NOTE — Discharge Instructions (Addendum)
Your blood work today, EKG, and chest x-ray were all reassuring that your chest pain is not cardiac related.  Continue your autoimmune medications.  Follow-up with your primary care provider for ongoing generalized muscle aches. Return to the emergency room if your chest pain worsens or develop shortness of breath.

## 2021-10-17 NOTE — ED Triage Notes (Signed)
Pt arrives ambulatory to ED states "I have a number of auto immune diseases and think I am flaring" Pt c/o generalized body aches X2-3 days with pain in joints specifically. Also c/o headaches and right sided chest pain.

## 2021-10-17 NOTE — ED Provider Notes (Signed)
[facility] EMERGENCY DEPARTMENT Provider Note   CSN: [id number] Arrival date & time: 10/17/21  1022     History Chief Complaint  Patient presents with   Generalized Body Aches    [name] is a 55 y.o. female.  55 year old female with a past medical history of lupus, Sjogren's, fibromyalgia presents today for evaluation of generalized arthralgias for 3-day duration with onset of chest pain since yesterday.  Patient is without complaints of fever, chills, abdominal pain, nausea, or urinary complaints.  Patient reports that this is typical for her autoimmune flareup but was concerned because of the chest pain she developed.  Patient's chest pain is nonradiating, without associated lightheadedness, diaphoresis, or nausea.  Patient reports the pain is intermittent.  Patient reports she is compliant with her autoimmune drug regimen.  She reports her rheumatologist is in [place] but she will look into setting one up locally.  Patient moved here 2 years ago but has been visiting [place] for her rheumatology visits.  The history is provided by the patient. No language interpreter was used.      Past Medical History:  Diagnosis Date   Coronary artery spasm (HCC)    Fibromyalgia    Lupus (HCC)    Osteoarthritis    Pulmonary emboli (HCC)    Sjogren's disease ([place])     Patient Active Problem List   Diagnosis Date Noted   Chest pain 05/03/2021   TIA (transient ischemic attack) 05/03/2021   Left facial numbness 05/03/2021   Essential hypertension 05/03/2021   Coronary artery spasm (HCC)    Sjogren's disease ([place])    Osteoarthritis    Lupus ([place])    Synovitis of right knee 04/17/2021    Past Surgical History:  Procedure Laterality Date   CESAREAN SECTION     CHOLECYSTECTOMY       OB History     Gravida  4   Para  3   Term  3   Preterm      AB  1   Living         SAB  1   IAB      Ectopic      Multiple      Live Births              No  family history on file.  Social History   Tobacco Use   Smoking status: Never   Smokeless tobacco: Never  Vaping Use   Vaping Use: Never used  Substance Use Topics   Alcohol use: Never   Drug use: Never    Home Medications Prior to Admission medications   Medication Sig Start Date End Date Taking? Authorizing Provider  aspirin EC 81 MG EC tablet Take 1 tablet (81 mg total) by mouth daily. Swallow whole. 05/05/21   [name], MD  cyclobenzaprine (FLEXERIL) 10 MG tablet Take 1 tablet by mouth at bedtime. 01/30/21   [provider]  cycloSPORINE (RESTASIS) 0.05 % ophthalmic emulsion Place 1 drop into both eyes 2 (two) times daily. 12/20/19   [provider]  diltiazem (CARDIZEM) 90 MG tablet Take 90 mg by mouth at bedtime.    [provider]  Erenumab-aooe (AIMOVIG) 140 MG/ML SOAJ Inject 140 mg into the skin every 30 (thirty) days. 12/20/19   [provider]  gabapentin (NEURONTIN) 600 MG tablet Take 600 mg by mouth 3 (three) times daily.    [provider]  hydroxychloroquine (PLAQUENIL) 200 MG tablet Take by mouth. 04/19/21  [provider]  sertraline (ZOLOFT) 100 MG tablet Take 100 mg by mouth at bedtime.    [provider]  valsartan-hydrochlorothiazide (DIOVAN-HCT) 320-25 MG tablet Take 1 tablet by mouth daily.    [provider]  Vitamin D, Ergocalciferol, (DRISDOL) 1.25 MG (50000 UNIT) CAPS capsule Take 50,000 Units by mouth every 7 (seven) days.    [provider]    Allergies    Lisinopril and Tylenol [acetaminophen]  Review of Systems   Review of Systems  Constitutional:  Negative for activity change, appetite change, chills and fever.  HENT:  Negative for congestion and trouble swallowing.   Respiratory:  Negative for cough and shortness of breath.   Cardiovascular:  Positive for chest pain. Negative for palpitations and leg swelling.  Gastrointestinal:  Negative for abdominal pain, nausea and  vomiting.  Genitourinary:  Negative for dysuria.  Musculoskeletal:  Positive for arthralgias. Negative for joint swelling and neck pain.  Neurological:  Negative for weakness and light-headedness.  All other systems reviewed and are negative.  Physical Exam Updated Vital Signs BP (!) 162/102   Pulse 66   Temp 98.9 F (37.2 C) (Oral)   Resp 18   Ht 5\' 7"  (1.702 m)   Wt 99.8 kg   SpO2 100%   BMI 34.46 kg/m   Physical Exam Vitals and nursing note reviewed.  Constitutional:      General: She is not in acute distress.    Appearance: Normal appearance. She is not ill-appearing.  HENT:     Head: Normocephalic and atraumatic.     Nose: Nose normal.  Eyes:     General: No scleral icterus.    Extraocular Movements: Extraocular movements intact.     Conjunctiva/sclera: Conjunctivae normal.  Cardiovascular:     Rate and Rhythm: Normal rate and regular rhythm.     Pulses: Normal pulses.     Heart sounds: Normal heart sounds.  Pulmonary:     Effort: Pulmonary effort is normal. No respiratory distress.     Breath sounds: Normal breath sounds. No wheezing or rales.  Abdominal:     General: There is no distension.     Tenderness: There is no abdominal tenderness.  Musculoskeletal:        General: Normal range of motion.     Cervical back: Normal range of motion.     Right lower leg: No edema.     Left lower leg: No edema.  Skin:    General: Skin is warm and dry.  Neurological:     General: No focal deficit present.     Mental Status: She is alert. Mental status is at baseline.    ED Results / Procedures / Treatments   Labs (all labs ordered are listed, but only abnormal results are displayed) Labs Reviewed  CBC WITH DIFFERENTIAL/PLATELET - Abnormal; Notable for the following components:      Result Value   WBC 1.7 (*)    Neutro Abs 0.5 (*)    All other components within normal limits  RESP PANEL BY RT-PCR (FLU A&B, COVID) ARPGX2  BASIC METABOLIC PANEL  PATHOLOGIST SMEAR  REVIEW  TROPONIN I (HIGH SENSITIVITY)    EKG EKG Interpretation  Date/Time:  Wednesday October 17 2021 10:44:18 EDT Ventricular Rate:  76 PR Interval:  154 QRS Duration: 88 QT Interval:  398 QTC Calculation: 447 R Axis:   7 Text Interpretation: Normal sinus rhythm Cannot rule out Anterior infarct , age undetermined Abnormal ECG When compared to prior, similar  appearance. No STEMI Confirmed by Antony Blackbird [phone]) on 10/17/2021 1:43:39 PM  Radiology DG Chest 2 View  Result Date: 10/17/2021 CLINICAL DATA:  Chest pain and body aches EXAM: CHEST - 2 VIEW COMPARISON:  05/03/2021 FINDINGS: Heart size is normal. Mediastinal shadows are normal. The lungs are clear. No bronchial thickening. No infiltrate, mass, effusion or collapse. Pulmonary vascularity is normal. No bony abnormality. IMPRESSION: Normal chest Electronically Signed   By: Nelson Chimes M.D.   On: 10/17/2021 11:02    Procedures Procedures   Medications Ordered in ED Medications  HYDROmorphone (DILAUDID) injection 2 mg (has no administration in time range)  morphine 4 MG/ML injection 4 mg (4 mg Intravenous Given 10/17/21 1550)  sodium chloride 0.9 % bolus 1,000 mL (1,000 mLs Intravenous New Bag/Given 10/17/21 1549)  ondansetron (ZOFRAN) injection 4 mg (4 mg Intravenous Given 10/17/21 1550)    ED Course  I have reviewed the triage vital signs and the nursing notes.  Pertinent labs & imaging results that were available during my care of the patient were reviewed by me and considered in my medical decision making (see chart for details).    MDM Rules/Calculators/A&P                           55 year old female presents today for evaluation of generalized muscle aches and chest pain.  Patient has a history of Sjogren's, lupus, and fibromyalgia.  Patient's chest pain is nonradiating, without associated diaphoresis, lightheadedness, nausea. Patient's CBC has chronic leukopenia and associated neutropenia otherwise unremarkable.  UA negative for UTI. BMP unremarkable.  Troponin was negative.  Respiratory panel was negative.  Chest x-ray without acute cardial pulm process.  EKG without acute ischemic changes.  Patient received 1 L fluid bolus, dose of 4 mg morphine, followed by dose of Dilaudid prior to discharge. Patient appears well and is appropriate for discharge.  Return precautions discussed.  Discussed importance of follow-up with PCP and rheumatologist.  Patient voices understanding and is in agreement with plan.  Final Clinical Impression(s) / ED Diagnoses Final diagnoses:  None    Rx / DC Orders ED Discharge Orders     None        Evlyn Courier, PA-C 10/17/21 1751    Drenda Freeze, MD 10/17/21 [phone]

## 2021-10-17 NOTE — ED Notes (Signed)
Attempted labs, usually needs [place] per pt.

## 2021-10-19 LAB — PATHOLOGIST SMEAR REVIEW

## 2021-10-24 ENCOUNTER — Encounter: Attending: Internal Medicine | Primary: Internal Medicine

## 2021-10-30 DIAGNOSIS — M3219 Other organ or system involvement in systemic lupus erythematosus: Principal | ICD-10-CM

## 2021-10-31 ENCOUNTER — Encounter: Attending: Internal Medicine | Primary: Internal Medicine

## 2021-10-31 DIAGNOSIS — M3219 Other organ or system involvement in systemic lupus erythematosus: Principal | ICD-10-CM

## 2021-11-02 ENCOUNTER — Encounter: Payer: Medicare Other | Attending: Ophthalmology | Primary: Internal Medicine

## 2021-11-23 ENCOUNTER — Other Ambulatory Visit: Payer: Self-pay | Admitting: Family Medicine

## 2021-11-23 ENCOUNTER — Ambulatory Visit (HOSPITAL_BASED_OUTPATIENT_CLINIC_OR_DEPARTMENT_OTHER)
Admission: RE | Admit: 2021-11-23 | Discharge: 2021-11-23 | Disposition: A | Discharge status: Home / Self Care | Payer: 59 | Source: Ambulatory Visit | Attending: Family Medicine | Admitting: Family Medicine

## 2021-11-23 ENCOUNTER — Ambulatory Visit (INDEPENDENT_AMBULATORY_CARE_PROVIDER_SITE_OTHER): Payer: 59 | Admitting: Family Medicine

## 2021-11-23 ENCOUNTER — Other Ambulatory Visit: Payer: Self-pay

## 2021-11-23 ENCOUNTER — Ambulatory Visit: Payer: Self-pay

## 2021-11-23 VITALS — BP 124/74 | Ht 66.0 in | Wt 225.0 lb

## 2021-11-23 DIAGNOSIS — M1711 Unilateral primary osteoarthritis, right knee: Secondary | ICD-10-CM | POA: Insufficient documentation

## 2021-11-23 NOTE — Progress Notes (Signed)
  Michelle Nash - 55 y.o. female MRN [id number]  Date of birth: [birth year]  SUBJECTIVE:  Including CC & ROS.  No chief complaint on file.   Michelle Nash is a 55 y.o. female that is presenting with acute on chronic right knee pain.  The pain is occurring over the medial compartment.  Pain is been ongoing since the summer.  We have tried other steroid injections as well as gel injections in the knee.  She has tried physical therapy in the past.   Review of Systems See HPI   HISTORY: Past Medical, Surgical, Social, and Family History Reviewed & Updated per EMR.   Pertinent Historical Findings include:  Past Medical History:  Diagnosis Date   Coronary artery spasm (HCC)    Fibromyalgia    Lupus ([place])    Osteoarthritis    Pulmonary emboli (HCC)    Sjogren's disease ([place])     Past Surgical History:  Procedure Laterality Date   CESAREAN SECTION     CHOLECYSTECTOMY      No family history on file.  Social History   Socioeconomic History   Marital status: Single    Spouse name: Not on file   Number of children: Not on file   Years of education: Not on file   Highest education level: Not on file  Occupational History   Not on file  Tobacco Use   Smoking status: Never   Smokeless tobacco: Never  Vaping Use   Vaping Use: Never used  Substance and Sexual Activity   Alcohol use: Never   Drug use: Never   Sexual activity: Not on file  Other Topics Concern   Not on file  Social History Narrative   Not on file   Social Determinants of Health   Financial Resource Strain: Not on file  Food Insecurity: Not on file  Transportation Needs: Not on file  Physical Activity: Not on file  Stress: Not on file  Social Connections: Not on file  Intimate Partner Violence: Not on file     PHYSICAL EXAM:  VS: BP 124/74   Ht 5\' 6"  (1.676 m)   Wt 225 lb (102.1 kg)   BMI 36.32 kg/m  Physical Exam Gen: NAD, alert, cooperative with exam, well-appearing    Aspiration/Injection  Procedure Note Michelle Nash 1966-06-15  Procedure: Injection Indications: right knee pain  Procedure Details Consent: Risks of procedure as well as the alternatives and risks of each were explained to the (patient/caregiver).  Consent for procedure obtained. Time Out: Verified patient identification, verified procedure, site/side was marked, verified correct patient position, special equipment/implants available, medications/allergies/relevent history reviewed, required imaging and test results available.  Performed.  The area was cleaned with iodine and alcohol swabs.    The right knee superior lateral suprapatellar pouch was injected using 3 cc of 1% lidocaine on a 21-gauge 2 inch needle.   The syringe was switched and a mixture containing 5 cc's of 32 mg Zilretta and 4 cc's of 0.25% bupivacaine was injected.  Ultrasound was used. Images were obtained in long views showing the injection.    A sterile dressing was applied.  Patient did tolerate procedure well.     ASSESSMENT & PLAN:   Osteoarthritis Acute on chronic in nature.  Instability appreciated with valgus or varus stress testing.  Patient is ambulatory on exam. -Counseled on home exercise therapy and supportive care. -Zilretta injection today. -Medial unloader brace due to thigh to calf ratio. -X-ray. -Could consider PRP.

## 2021-11-23 NOTE — Assessment & Plan Note (Addendum)
Acute on chronic in nature.  Instability appreciated with valgus or varus stress testing.  Patient is ambulatory on exam. -Counseled on home exercise therapy and supportive care. -Zilretta injection today. -Medial unloader brace due to thigh to calf ratio. -X-ray. -Could consider PRP.

## 2021-11-23 NOTE — Patient Instructions (Signed)
Good to see you Please use ice as needed  I will call with the xray results.  The [employer] representative will call to measure for the custom brace   Please send me a message in MyChart with any questions or updates.  Please see me back in 4-6 weeks.   --Dr. Raeford Razor

## 2021-11-26 ENCOUNTER — Telehealth: Payer: Self-pay | Admitting: Family Medicine

## 2021-11-26 NOTE — Telephone Encounter (Signed)
Informed of results.   Rosemarie Ax, MD [facility] 11/26/2021, 9:06 AM

## 2021-12-13 ENCOUNTER — Emergency Department (HOSPITAL_BASED_OUTPATIENT_CLINIC_OR_DEPARTMENT_OTHER): Payer: 59

## 2021-12-13 ENCOUNTER — Emergency Department (HOSPITAL_BASED_OUTPATIENT_CLINIC_OR_DEPARTMENT_OTHER)
Admission: EM | Admit: 2021-12-13 | Discharge: 2021-12-13 | Disposition: A | Discharge status: Home / Self Care | Payer: 59 | Attending: Emergency Medicine | Admitting: Emergency Medicine

## 2021-12-13 ENCOUNTER — Encounter (HOSPITAL_BASED_OUTPATIENT_CLINIC_OR_DEPARTMENT_OTHER): Payer: Self-pay | Admitting: *Deleted

## 2021-12-13 ENCOUNTER — Other Ambulatory Visit: Payer: Self-pay

## 2021-12-13 DIAGNOSIS — U071 COVID-19: Secondary | ICD-10-CM

## 2021-12-13 DIAGNOSIS — J101 Influenza due to other identified influenza virus with other respiratory manifestations: Secondary | ICD-10-CM | POA: Diagnosis not present

## 2021-12-13 DIAGNOSIS — Z79899 Other long term (current) drug therapy: Secondary | ICD-10-CM | POA: Diagnosis not present

## 2021-12-13 DIAGNOSIS — I10 Essential (primary) hypertension: Secondary | ICD-10-CM | POA: Diagnosis not present

## 2021-12-13 DIAGNOSIS — R059 Cough, unspecified: Secondary | ICD-10-CM | POA: Diagnosis present

## 2021-12-13 DIAGNOSIS — Z7982 Long term (current) use of aspirin: Secondary | ICD-10-CM | POA: Diagnosis not present

## 2021-12-13 DIAGNOSIS — H9209 Otalgia, unspecified ear: Secondary | ICD-10-CM | POA: Diagnosis not present

## 2021-12-13 LAB — RESP PANEL BY RT-PCR (FLU A&B, COVID) ARPGX2
Influenza A by PCR: NEGATIVE
Influenza B by PCR: NEGATIVE
SARS Coronavirus 2 by RT PCR: POSITIVE — AB

## 2021-12-13 MED ORDER — BENZONATATE 100 MG PO CAPS: 100.0000 mg | ORAL_CAPSULE | Freq: Three times a day (TID) | ORAL | 0 refills | Status: AC | PRN

## 2021-12-13 NOTE — ED Triage Notes (Signed)
Exposure to flu with sx x 5 days, cough , fever .

## 2021-12-13 NOTE — ED Notes (Signed)
Pts o2 remained at 98% and HR remained in the 90s during ambulation. Steady gait. No issues observed during ambulation.

## 2021-12-13 NOTE — Discharge Instructions (Signed)
I am prescribing you a cough medication called Tessalon Perles.  You can take these up to 3 times per day.  Please only take them as prescribed.  I have attached information on COVID-19.  Please continue to quarantine based on current CDC guidelines.  If you develop any new or worsening symptoms please come back to the emergency department immediately.

## 2021-12-13 NOTE — ED Provider Notes (Signed)
[place] EMERGENCY DEPARTMENT Provider Note   CSN: [id number] Arrival date & time: 12/13/21  1641     History Chief Complaint  Patient presents with   Exposure to FLu    Michelle Nash is a 55 y.o. female.  HPI Patient is a 55 year old female with a complex medical history as noted below.  She presents to the emergency department due to cough, sore throat, congestion, ear pain, headaches, body aches.  Patient states her symptoms started about 5 to 6 days ago.  She reports some associated mild shortness of breath with exertion.  Denies acute chest pain.  Reports posttussive emesis but has no nausea or repetitive vomiting.  No diarrhea.  States she has been vaccinated for COVID-19 x2 and denies any previous history of known COVID-19 infections.  Patient states that she recently had family in town who were sick with similar symptoms.    Past Medical History:  Diagnosis Date   Coronary artery spasm (HCC)    Fibromyalgia    Lupus (HCC)    Osteoarthritis    Pulmonary emboli (HCC)    Sjogren's disease ([place])     Patient Active Problem List   Diagnosis Date Noted   Chest pain 05/03/2021   TIA (transient ischemic attack) 05/03/2021   Left facial numbness 05/03/2021   Essential hypertension 05/03/2021   Coronary artery spasm (HCC)    Sjogren's disease ([place])    Osteoarthritis    Lupus ([place])    Synovitis of right knee 04/17/2021    Past Surgical History:  Procedure Laterality Date   CESAREAN SECTION     CHOLECYSTECTOMY       OB History     Gravida  4   Para  3   Term  3   Preterm      AB  1   Living         SAB  1   IAB      Ectopic      Multiple      Live Births              No family history on file.  Social History   Tobacco Use   Smoking status: Never   Smokeless tobacco: Never  Vaping Use   Vaping Use: Never used  Substance Use Topics   Alcohol use: Never   Drug use: Never    Home Medications Prior to Admission  medications   Medication Sig Start Date End Date Taking? Authorizing Provider  aspirin EC 81 MG EC tablet Take 1 tablet (81 mg total) by mouth daily. Swallow whole. 05/05/21   [name], MD  benzonatate (TESSALON) 100 MG capsule Take 1 capsule (100 mg total) by mouth 3 (three) times daily as needed for cough. 12/13/21  Yes [name], PA-C  cyclobenzaprine (FLEXERIL) 10 MG tablet Take 1 tablet by mouth at bedtime. 01/30/21   [provider]  cycloSPORINE (RESTASIS) 0.05 % ophthalmic emulsion Place 1 drop into both eyes 2 (two) times daily. 12/20/19   [provider]  diltiazem (CARDIZEM) 90 MG tablet Take 90 mg by mouth at bedtime.    [provider]  Erenumab-aooe (AIMOVIG) 140 MG/ML SOAJ Inject 140 mg into the skin every 30 (thirty) days. 12/20/19   [provider]  gabapentin (NEURONTIN) 600 MG tablet Take 600 mg by mouth 3 (three) times daily.    [provider]  hydroxychloroquine (PLAQUENIL) 200 MG tablet Take by mouth. 04/19/21   [provider]  sertraline (ZOLOFT) 100 MG tablet Take 100 mg by mouth at bedtime.    [provider]  valsartan-hydrochlorothiazide (DIOVAN-HCT) 320-25 MG tablet Take 1 tablet by mouth daily.    [provider]  Vitamin D, Ergocalciferol, (DRISDOL) 1.25 MG (50000 UNIT) CAPS capsule Take 50,000 Units by mouth every 7 (seven) days.    [provider]    Allergies    Lisinopril and Tylenol [acetaminophen]  Review of Systems   Review of Systems  Constitutional:  Positive for chills, fatigue and fever.  HENT:  Positive for congestion, ear pain, postnasal drip and sore throat.   Respiratory:  Positive for cough and shortness of breath.   Cardiovascular:  Negative for chest pain.  Gastrointestinal:  Positive for vomiting. Negative for abdominal pain, diarrhea and nausea.   Physical Exam Updated Vital Signs BP 125/87 (BP Location: Right Arm)    Pulse 89    Temp 99 F (37.2 C)  (Oral)    Resp 18    Ht 5\' 6"  (1.676 m)    Wt 104.3 kg    SpO2 100%    BMI 37.12 kg/m   Physical Exam Vitals and nursing note reviewed.  Constitutional:      General: She is not in acute distress.    Appearance: Normal appearance. She is not ill-appearing, toxic-appearing or diaphoretic.  HENT:     Head: Normocephalic and atraumatic.     Right Ear: Tympanic membrane, ear canal and external ear normal. There is no impacted cerumen.     Left Ear: Tympanic membrane, ear canal and external ear normal. There is no impacted cerumen.     Ears:     Comments: Bilateral ears, EACs, and TMs appear normal.    Nose: Nose normal.     Mouth/Throat:     Mouth: Mucous membranes are moist.     Pharynx: Oropharynx is clear. No oropharyngeal exudate or posterior oropharyngeal erythema.  Eyes:     General: No scleral icterus.       Right eye: No discharge.        Left eye: No discharge.     Extraocular Movements: Extraocular movements intact.     Conjunctiva/sclera: Conjunctivae normal.  Cardiovascular:     Rate and Rhythm: Normal rate and regular rhythm.     Pulses: Normal pulses.     Heart sounds: Normal heart sounds. No murmur heard.   No friction rub. No gallop.     Comments: Heart is regular rate and rhythm without murmurs, rubs, or gallops. Pulmonary:     Effort: Pulmonary effort is normal. No respiratory distress.     Breath sounds: Normal breath sounds. No stridor. No wheezing, rhonchi or rales.     Comments: Lungs are clear to auscultation bilaterally.  No wheezing, rales, or rhonchi. Abdominal:     General: Abdomen is flat.     Palpations: Abdomen is soft.     Tenderness: There is no abdominal tenderness.     Comments: Abdomen is soft and nontender.  Musculoskeletal:        General: Normal range of motion.     Cervical back: Normal range of motion and neck supple. No tenderness.  Skin:    General: Skin is warm and dry.  Neurological:     General: No focal deficit present.      Mental Status: She is alert and oriented to person, place, and time.  Psychiatric:        Mood and Affect: Mood normal.  Behavior: Behavior normal.    ED Results / Procedures / Treatments   Labs (all labs ordered are listed, but only abnormal results are displayed) Labs Reviewed  RESP PANEL BY RT-PCR (FLU A&B, COVID) ARPGX2 - Abnormal; Notable for the following components:      Result Value   SARS Coronavirus 2 by RT PCR POSITIVE (*)    All other components within normal limits   EKG None  Radiology DG Chest Portable 1 View  Result Date: 12/13/2021 CLINICAL DATA:  Exposure to fluid, history of lupus. Cough x5 days of fever EXAM: PORTABLE CHEST 1 VIEW COMPARISON:  October 17, 2021 FINDINGS: The heart size and mediastinal contours are within normal limits. No focal airspace consolidation. No visible pleural effusion or pneumothorax. The visualized skeletal structures are unremarkable. IMPRESSION: No active disease. Electronically Signed   By: Dahlia Bailiff M.D.   On: 12/13/2021 17:34    Procedures Procedures   Medications Ordered in ED Medications - No data to display  ED Course  I have reviewed the triage vital signs and the nursing notes.  Pertinent labs & imaging results that were available during my care of the patient were reviewed by me and considered in my medical decision making (see chart for details).    MDM Rules/Calculators/A&P                          Patient is a 55 year old nontoxic-appearing female who presents to the emergency department with what appears to be symptoms related to COVID-19.  Symptoms started about 5 to 6 days ago.  On my exam patient is afebrile and not tachycardic.  Oxygen saturations are 100% on room air.  Heart is regular rate and rhythm without murmurs, rubs, or gallops.  Lungs are clear to auscultation bilaterally.  Abdomen is soft and nontender.  Patient does note shortness of breath so her pulse ox was measured while ambulating  which remained at 98% and heart rate remained in the 90s.  Feel that the patient is stable for discharge at this time and she is agreeable.  Given the length of her symptoms will not initiate antiviral medications.  Recommended patient continue to manage her symptoms with OTC treatments.  Discussed adequate hydration as well as continued quarantine based on current CDC guidelines.  Patient given strict return precautions and knows to return to the emergency department with any new or worsening symptoms.  Her questions were answered and she was amicable at the time of discharge.  Final Clinical Impression(s) / ED Diagnoses Final diagnoses:  DVVOH-60   Rx / DC Orders ED Discharge Orders          Ordered    benzonatate (TESSALON) 100 MG capsule  3 times daily PRN        12/13/21 2111             [name], PA-C 12/13/21 2132    Luna Fuse, MD 12/13/21 2351

## 2021-12-15 ENCOUNTER — Emergency Department (HOSPITAL_BASED_OUTPATIENT_CLINIC_OR_DEPARTMENT_OTHER)
Admission: EM | Admit: 2021-12-15 | Discharge: 2021-12-15 | Disposition: A | Discharge status: Home / Self Care | Payer: 59 | Attending: Emergency Medicine | Admitting: Emergency Medicine

## 2021-12-15 ENCOUNTER — Encounter (HOSPITAL_BASED_OUTPATIENT_CLINIC_OR_DEPARTMENT_OTHER): Payer: Self-pay | Admitting: Emergency Medicine

## 2021-12-15 ENCOUNTER — Other Ambulatory Visit: Payer: Self-pay

## 2021-12-15 DIAGNOSIS — R059 Cough, unspecified: Secondary | ICD-10-CM | POA: Diagnosis present

## 2021-12-15 DIAGNOSIS — I10 Essential (primary) hypertension: Secondary | ICD-10-CM | POA: Insufficient documentation

## 2021-12-15 DIAGNOSIS — U071 COVID-19: Secondary | ICD-10-CM | POA: Insufficient documentation

## 2021-12-15 DIAGNOSIS — Z7982 Long term (current) use of aspirin: Secondary | ICD-10-CM | POA: Diagnosis not present

## 2021-12-15 DIAGNOSIS — Z79899 Other long term (current) drug therapy: Secondary | ICD-10-CM | POA: Insufficient documentation

## 2021-12-15 DIAGNOSIS — R051 Acute cough: Secondary | ICD-10-CM

## 2021-12-15 MED ORDER — PREDNISONE 10 MG PO TABS: 20.0000 mg | ORAL_TABLET | Freq: Every day | ORAL | 0 refills | Status: AC

## 2021-12-15 MED ORDER — PROMETHAZINE-DM 6.25-15 MG/5ML PO SYRP: 5.0000 mL | ORAL_SOLUTION | Freq: Four times a day (QID) | ORAL | 0 refills | Status: AC | PRN

## 2021-12-15 NOTE — ED Triage Notes (Signed)
Pt reports cough that caused choking today. Pt tested positive for COVID couple days ago. Pt denies shortness of breath at this time. Pt took one dose of prescribed cough medication this morning.

## 2021-12-15 NOTE — ED Provider Notes (Signed)
[facility] Provider Note   CSN: [id number] Arrival date & time: 12/15/21  1655     History Chief Complaint  Patient presents with   Cough    Michelle Nash is a 55 y.o. female.   Cough Associated symptoms: no chest pain, no chills, no fever, no headaches, no myalgias, no rash and no shortness of breath    Cough for one week.  Was tested for COVID-19 yesterday and found be positive.  Denies any shortness of breath but states that Michelle Nash has been coughing frequently is mostly a dry hacking cough and uncomfortable and states that it is interfering with her sleep.  No hemoptysis Dx w covid 2 days ago. Contintued to have cough, congestion and fatigue. No CP except when coughing.   Endorses some mild myalgias as well.  No nausea vomiting or diarrhea.  No other associate symptoms.  No aggravating or mitigating factors apart from cold air symptoms making her cough worse.  Michelle Nash is also requesting a work note.     Past Medical History:  Diagnosis Date   Coronary artery spasm (HCC)    Fibromyalgia    Lupus (HCC)    Osteoarthritis    Pulmonary emboli (HCC)    Sjogren's disease ([place])     Patient Active Problem List   Diagnosis Date Noted   Chest pain 05/03/2021   TIA (transient ischemic attack) 05/03/2021   Left facial numbness 05/03/2021   Essential hypertension 05/03/2021   Coronary artery spasm (HCC)    Sjogren's disease ([place])    Osteoarthritis    Lupus ([place])    Synovitis of right knee 04/17/2021    Past Surgical History:  Procedure Laterality Date   CESAREAN SECTION     CHOLECYSTECTOMY       OB History     Gravida  4   Para  3   Term  3   Preterm      AB  1   Living         SAB  1   IAB      Ectopic      Multiple      Live Births              No family history on file.  Social History   Tobacco Use   Smoking status: Never   Smokeless tobacco: Never  Vaping Use   Vaping Use: Never used  Substance Use  Topics   Alcohol use: Never   Drug use: Never    Home Medications Prior to Admission medications   Medication Sig Start Date End Date Taking? Authorizing Provider  predniSONE (DELTASONE) 10 MG tablet Take 2 tablets (20 mg total) by mouth daily for 5 days. 12/15/21 12/20/21 Yes Trexton Escamilla S, PA  promethazine-dextromethorphan (PROMETHAZINE-DM) 6.25-15 MG/5ML syrup Take 5 mLs by mouth 4 (four) times daily as needed for cough. 12/15/21  Yes Pati Gallo S, PA  aspirin EC 81 MG EC tablet Take 1 tablet (81 mg total) by mouth daily. Swallow whole. 05/05/21   [name], MD  benzonatate (TESSALON) 100 MG capsule Take 1 capsule (100 mg total) by mouth 3 (three) times daily as needed for cough. 12/13/21   [name], PA-C  cyclobenzaprine (FLEXERIL) 10 MG tablet Take 1 tablet by mouth at bedtime. 01/30/21   [provider]  cycloSPORINE (RESTASIS) 0.05 % ophthalmic emulsion Place 1 drop into both eyes 2 (two) times daily. 12/20/19   [provider]  diltiazem [place])  90 MG tablet Take 90 mg by mouth at bedtime.    [provider]  Erenumab-aooe (AIMOVIG) 140 MG/ML SOAJ Inject 140 mg into the skin every 30 (thirty) days. 12/20/19   [provider]  gabapentin (NEURONTIN) 600 MG tablet Take 600 mg by mouth 3 (three) times daily.    [provider]  hydroxychloroquine (PLAQUENIL) 200 MG tablet Take by mouth. 04/19/21   [provider]  sertraline (ZOLOFT) 100 MG tablet Take 100 mg by mouth at bedtime.    [provider]  valsartan-hydrochlorothiazide (DIOVAN-HCT) 320-25 MG tablet Take 1 tablet by mouth daily.    [provider]  Vitamin D, Ergocalciferol, (DRISDOL) 1.25 MG (50000 UNIT) CAPS capsule Take 50,000 Units by mouth every 7 (seven) days.    [provider]    Allergies    Lisinopril and Tylenol [acetaminophen]  Review of Systems   Review of Systems  Constitutional:  Positive for fatigue. Negative for  chills and fever.  HENT:  Positive for congestion.   Eyes:  Negative for pain.  Respiratory:  Positive for cough. Negative for shortness of breath.   Cardiovascular:  Negative for chest pain and leg swelling.  Gastrointestinal:  Negative for abdominal pain and vomiting.  Genitourinary:  Negative for dysuria.  Musculoskeletal:  Negative for myalgias.  Skin:  Negative for rash.  Neurological:  Negative for dizziness and headaches.   Physical Exam Updated Vital Signs BP 113/73    Pulse 89    Temp 98.4 F (36.9 C) (Oral)    Resp 18    Ht 5\' 6"  (1.676 m)    Wt 104.3 kg    SpO2 100%    BMI 37.12 kg/m   Physical Exam Vitals and nursing note reviewed.  Constitutional:      General: Michelle Nash is not in acute distress. HENT:     Head: Normocephalic and atraumatic.     Nose: Nose normal.  Eyes:     General: No scleral icterus. Cardiovascular:     Rate and Rhythm: Normal rate and regular rhythm.     Pulses: Normal pulses.     Heart sounds: Normal heart sounds.  Pulmonary:     Effort: Pulmonary effort is normal. No respiratory distress.     Breath sounds: No wheezing.     Comments: Coughing frequently.  Lungs clear to auscultation apart from coughing.  No rhonchi wheezing or crackles. Abdominal:     Palpations: Abdomen is soft.     Tenderness: There is no abdominal tenderness.  Musculoskeletal:     Cervical back: Normal range of motion.     Right lower leg: No edema.     Left lower leg: No edema.  Skin:    General: Skin is warm and dry.     Capillary Refill: Capillary refill takes less than 2 seconds.  Neurological:     Mental Status: Michelle Nash is alert. Mental status is at baseline.  Psychiatric:        Mood and Affect: Mood normal.        Behavior: Behavior normal.    ED Results / Procedures / Treatments   Labs (all labs ordered are listed, but only abnormal results are displayed) Labs Reviewed - No data to display  EKG None  Radiology No results found.  Procedures Procedures    Medications Ordered in ED Medications - No data to display  ED Course  I have reviewed the triage vital signs and the nursing notes.  Pertinent labs & imaging  results that were available during my care of the patient were reviewed by me and considered in my medical decision making (see chart for details).    MDM Rules/Calculators/A&P                          Patient is a 55 year old female tested positive for COVID-19 12/26 has had ongoing cough for the past week approximately.  Well outside of any treatment.  For antivirals.  Well-appearing on examination.  SPO2 100% on room air.  Vital signs within normal limits prescribed antitussives and shortness of prednisone.  Return precautions discussed.  Follow-up with PCP.  Syrenity Klepacki was evaluated in Emergency Department on 12/15/2021 for the symptoms described in the history of present illness. Michelle Nash was evaluated in the context of the global COVID-19 pandemic, which necessitated consideration that the patient might be at risk for infection with the SARS-CoV-2 virus that causes COVID-19. Institutional protocols and algorithms that pertain to the evaluation of patients at risk for COVID-19 are in a state of rapid change based on information released by regulatory bodies including the CDC and federal and state organizations. These policies and algorithms were followed during the patient's care in the ED.   Final Clinical Impression(s) / ED Diagnoses Final diagnoses:  Acute cough  COVID-19    Rx / DC Orders ED Discharge Orders          Ordered    promethazine-dextromethorphan (PROMETHAZINE-DM) 6.25-15 MG/5ML syrup  4 times daily PRN        12/15/21 2211    predniSONE (DELTASONE) 10 MG tablet  Daily        12/15/21 2211             Pati Gallo [place], [place] 12/15/21 2244    Godfrey Pick, MD 12/16/21 [phone]

## 2021-12-15 NOTE — ED Notes (Signed)
Discharge instructions discussed with pt. Pt verbalized understanding. Pt stable and ambulatory.  °

## 2021-12-15 NOTE — Discharge Instructions (Signed)
I have sent a prescription for prednisone and a medication called Promethazine DM.  Take these as prescribed.  Also recommend Flonase and Zyrtec daily.  Try plenty of water use cough drops especially mentholated ones.  You can apply some Vicks vapor rub to your chest at night this will help some too.

## 2021-12-26 ENCOUNTER — Ambulatory Visit (INDEPENDENT_AMBULATORY_CARE_PROVIDER_SITE_OTHER): Payer: 59 | Admitting: Family Medicine

## 2021-12-26 ENCOUNTER — Ambulatory Visit: Payer: Self-pay

## 2021-12-26 ENCOUNTER — Encounter: Payer: Self-pay | Admitting: Family Medicine

## 2021-12-26 VITALS — BP 108/78 | Ht 66.0 in | Wt 230.0 lb

## 2021-12-26 DIAGNOSIS — M1712 Unilateral primary osteoarthritis, left knee: Secondary | ICD-10-CM | POA: Diagnosis not present

## 2021-12-26 NOTE — Patient Instructions (Signed)
Good to see you Please try ice as needed   Please send me a message in MyChart with any questions or updates.  Please see me back in 4-6 weeks.   --Dr. Raeford Razor

## 2021-12-26 NOTE — Assessment & Plan Note (Signed)
Acute on chronic in nature.  Has increased hyperemia surrounding the joint space. -Counseled on home exercise therapy and supportive care. -Zilretta injection today. -Could consider physical therapy or further imaging.

## 2021-12-26 NOTE — Progress Notes (Signed)
°  [name] - 56 y.o. female MRN [id number]  Date of birth: Apr 17, 1966  SUBJECTIVE:  Including CC & ROS.  No chief complaint on file.   Michelle Nash is a 56 y.o. female that is presenting with acute on chronic left knee pain.  She has a history of similar pain on the left knee.  Has gotten significantly worse over the past few weeks.  Denies any injury.  Independent review of the right knee x-ray from 12/9 shows minimal degenerative changes appreciated.   Review of Systems See HPI   HISTORY: Past Medical, Surgical, Social, and Family History Reviewed & Updated per EMR.   Pertinent Historical Findings include:  Past Medical History:  Diagnosis Date   Coronary artery spasm (HCC)    Fibromyalgia    Lupus (HCC)    Osteoarthritis    Pulmonary emboli (HCC)    Sjogren's disease (HCC)     Past Surgical History:  Procedure Laterality Date   CESAREAN SECTION     CHOLECYSTECTOMY       PHYSICAL EXAM:  VS: BP 108/78 (BP Location: Left Arm, Patient Position: Sitting)    Ht 5\' 6"  (1.676 m)    Wt 230 lb (104.3 kg)    BMI 37.12 kg/m  Physical Exam Gen: NAD, alert, cooperative with exam, well-appearing MSK:  Neurovascularly intact    Limited ultrasound: Left knee:  Mild effusion suprapatellar pouch. Normal-appearing quadricep and patellar tendon. Increased hyperemia and mild degenerative changes in the lateral compartment. Increased hyperemia in the medial compartment  Summary: Degenerative changes appreciated of the knee.  Ultrasound and interpretation by Clearance Coots, MD   Aspiration/Injection Procedure Note Michelle Nash 03/03/1966  Procedure: Injection Indications: left knee pain   Procedure Details Consent: Risks of procedure as well as the alternatives and risks of each were explained to the (patient/caregiver).  Consent for procedure obtained. Time Out: Verified patient identification, verified procedure, site/side was marked, verified correct patient position,  special equipment/implants available, medications/allergies/relevent history reviewed, required imaging and test results available.  Performed.  The area was cleaned with iodine and alcohol swabs.    The left knee superior lateral suprapatellar pouch was injected using 3 cc of 1% lidocaine on a 21-gauge 2 inch needle. The syringe was switched and a mixture containing 5 cc's of 32 mg Zilretta and 4 cc's of 0.25% bupivacaine was injected.  Ultrasound was used. Images were obtained in long views showing the injection.    A sterile dressing was applied.  Patient did tolerate procedure well.      ASSESSMENT & PLAN:   Osteoarthritis Acute on chronic in nature.  Has increased hyperemia surrounding the joint space. -Counseled on home exercise therapy and supportive care. -Zilretta injection today. -Could consider physical therapy or further imaging.

## 2022-01-17 ENCOUNTER — Ambulatory Visit: Payer: 59 | Admitting: Family

## 2022-01-18 ENCOUNTER — Ambulatory Visit (INDEPENDENT_AMBULATORY_CARE_PROVIDER_SITE_OTHER): Payer: 59 | Admitting: Family Medicine

## 2022-01-18 ENCOUNTER — Encounter: Payer: Self-pay | Admitting: Family Medicine

## 2022-01-18 VITALS — Ht 66.0 in | Wt 230.0 lb

## 2022-01-18 DIAGNOSIS — M659 Synovitis and tenosynovitis, unspecified: Secondary | ICD-10-CM | POA: Diagnosis not present

## 2022-01-18 DIAGNOSIS — M23204 Derangement of unspecified medial meniscus due to old tear or injury, left knee: Secondary | ICD-10-CM | POA: Insufficient documentation

## 2022-01-18 DIAGNOSIS — R937 Abnormal findings on diagnostic imaging of other parts of musculoskeletal system: Secondary | ICD-10-CM | POA: Insufficient documentation

## 2022-01-18 NOTE — Patient Instructions (Signed)
Good to see you We'll get the xray of your left knee  We'll get the MRIs at the [facility]   Please send me a message in MyChart with any questions or updates.  We'll setup a virtual visit once the MRI is resulted.   --Dr. Raeford Razor

## 2022-01-18 NOTE — Assessment & Plan Note (Signed)
Acute on chronic in nature.  Pain has been ongoing for several years.  X-rays have been performed and show effusion.  Has tried steroid injections and physical therapy. -Counseled on home exercise therapy and supportive care. -Continue medial unloader brace. -MRI of the right knee to evaluate for meniscal tear and internal derangement for presurgical planning.

## 2022-01-18 NOTE — Assessment & Plan Note (Addendum)
Acute on chronic in nature.  Pain occurring over the medial aspect.  Has completed physical therapy.  Having mechanical symptoms -Counseled on home exercise therapy and supportive care. -X-ray. -MRI of the left knee to evaluate for meniscal tear and consideration for presurgical planning.

## 2022-01-18 NOTE — Progress Notes (Signed)
°  Michelle Nash - 56 y.o. female MRN [id number]  Date of birth: September 11, 1966  SUBJECTIVE:  Including CC & ROS.  No chief complaint on file.   Michelle Nash is a 56 y.o. female that is presenting with acute on chronic bilateral knee pain.  She has had knee pain for several years.  Has tried medications and is using a medial unloader brace on the right knee.   Review of Systems See HPI   HISTORY: Past Medical, Surgical, Social, and Family History Reviewed & Updated per EMR.   Pertinent Historical Findings include:  Past Medical History:  Diagnosis Date   Coronary artery spasm (HCC)    Fibromyalgia    Lupus (HCC)    Osteoarthritis    Pulmonary emboli (HCC)    Sjogren's disease (HCC)     Past Surgical History:  Procedure Laterality Date   CESAREAN SECTION     CHOLECYSTECTOMY       PHYSICAL EXAM:  VS: Ht 5\' 6"  (1.676 m)    Wt 230 lb (104.3 kg)    BMI 37.12 kg/m  Physical Exam Gen: NAD, alert, cooperative with exam, well-appearing MSK:  Right and left knee: Mild effusion bilaterally. Positive Murray's test bilaterally. Limited range of motion in flexion extension Neurovascularly intact       ASSESSMENT & PLAN:   Synovitis of right knee Acute on chronic in nature.  Pain has been ongoing for several years.  X-rays have been performed and show effusion.  Has tried steroid injections and physical therapy. -Counseled on home exercise therapy and supportive care. -Continue medial unloader brace. -MRI of the right knee to evaluate for meniscal tear and internal derangement for presurgical planning.  Degenerative tear of medial meniscus of left knee Acute on chronic in nature.  Pain occurring over the medial aspect.  Has completed physical therapy.  Having mechanical symptoms -Counseled on home exercise therapy and supportive care. -X-ray. -MRI of the left knee to evaluate for meniscal tear and consideration for presurgical planning.

## 2022-01-25 ENCOUNTER — Other Ambulatory Visit: Payer: Self-pay

## 2022-01-25 ENCOUNTER — Ambulatory Visit (HOSPITAL_BASED_OUTPATIENT_CLINIC_OR_DEPARTMENT_OTHER)
Admission: RE | Admit: 2022-01-25 | Discharge: 2022-01-25 | Disposition: A | Discharge status: Home / Self Care | Payer: 59 | Source: Ambulatory Visit | Attending: Family Medicine | Admitting: Family Medicine

## 2022-01-25 DIAGNOSIS — M23204 Derangement of unspecified medial meniscus due to old tear or injury, left knee: Secondary | ICD-10-CM | POA: Insufficient documentation

## 2022-01-26 ENCOUNTER — Ambulatory Visit (INDEPENDENT_AMBULATORY_CARE_PROVIDER_SITE_OTHER): Payer: 59

## 2022-01-26 DIAGNOSIS — M23204 Derangement of unspecified medial meniscus due to old tear or injury, left knee: Secondary | ICD-10-CM | POA: Diagnosis not present

## 2022-01-26 DIAGNOSIS — M659 Synovitis and tenosynovitis, unspecified: Secondary | ICD-10-CM | POA: Diagnosis not present

## 2022-01-30 ENCOUNTER — Telehealth (INDEPENDENT_AMBULATORY_CARE_PROVIDER_SITE_OTHER): Payer: 59 | Admitting: Family Medicine

## 2022-01-30 ENCOUNTER — Encounter: Payer: Self-pay | Admitting: Family Medicine

## 2022-01-30 VITALS — Ht 66.0 in | Wt 230.0 lb

## 2022-01-30 DIAGNOSIS — M1711 Unilateral primary osteoarthritis, right knee: Secondary | ICD-10-CM

## 2022-01-30 DIAGNOSIS — R937 Abnormal findings on diagnostic imaging of other parts of musculoskeletal system: Secondary | ICD-10-CM

## 2022-01-30 NOTE — Assessment & Plan Note (Signed)
Acutely occurring.  Right knee with the MRI is demonstrating moderate changes. -Counseled on home exercise therapy and supportive care. -  referral to physical therapy

## 2022-01-30 NOTE — Progress Notes (Signed)
Virtual Visit via Video Note  I connected with Michelle Nash on 01/30/22 at  1:10 PM EST by a video enabled telemedicine application and verified that I am speaking with the correct person using two identifiers.  Location: Patient: home Provider: office   I discussed the limitations of evaluation and management by telemedicine and the availability of in person appointments. The patient expressed understanding and agreed to proceed.  History of Present Illness:  Michelle Nash is a 56 year old female that is following up after the MRI of her left and right knee.  The left knee is showing high-grade partial-thickness cartilage loss with areas of full-thickness cartilage loss with subchondral reactive marrow edema.  The MRI of the right knee is demonstrating moderate osteoarthritis in the patellofemoral and medial compartments.   Observations/Objective:   Assessment and Plan:  OA knee right:  Acutely occurring.  Right knee with the MRI is demonstrating moderate changes. -Counseled on home exercise therapy and supportive care. -  referral to physical therapy   Reactive bone marrow edema of the left knee: Acutely occurring.  Has had improvement with the Zilretta injection.  The MRI was demonstrating high grade loss with subsequent reactive marrow edema. -Counseled on home exercise therapy and supportive care. -Referral to physical therapy.  Follow Up Instructions:    I discussed the assessment and treatment plan with the patient. The patient was provided an opportunity to ask questions and all were answered. The patient agreed with the plan and demonstrated an understanding of the instructions.   The patient was advised to call back or seek an in-person evaluation if the symptoms worsen or if the condition fails to improve as anticipated.  I provided 11 minutes of non-face-to-face time during this encounter.   Clearance Coots, MD

## 2022-01-30 NOTE — Assessment & Plan Note (Signed)
Acutely occurring.  Has had improvement with the Zilretta injection.  The MRI was demonstrating high grade loss with subsequent reactive marrow edema. -Counseled on home exercise therapy and supportive care. -Referral to physical therapy.

## 2022-02-18 ENCOUNTER — Ambulatory Visit: Payer: Medicare Other | Attending: Family Medicine | Primary: Internal Medicine

## 2022-02-18 DIAGNOSIS — E669 Obesity, unspecified: Secondary | ICD-10-CM

## 2022-02-18 DIAGNOSIS — M170 Bilateral primary osteoarthritis of knee: Secondary | ICD-10-CM

## 2022-02-18 DIAGNOSIS — M329 Systemic lupus erythematosus, unspecified: Secondary | ICD-10-CM

## 2022-02-18 DIAGNOSIS — M3500 Sicca syndrome, unspecified: Secondary | ICD-10-CM

## 2022-02-18 DIAGNOSIS — I6350 Cerebral infarction due to unspecified occlusion or stenosis of unspecified cerebral artery: Secondary | ICD-10-CM

## 2022-02-18 DIAGNOSIS — F32A Anxiety and depression: Secondary | ICD-10-CM

## 2022-02-18 DIAGNOSIS — I2510 Atherosclerotic heart disease of native coronary artery without angina pectoris: Secondary | ICD-10-CM

## 2022-02-18 DIAGNOSIS — I8290 Acute embolism and thrombosis of unspecified vein: Secondary | ICD-10-CM

## 2022-02-18 DIAGNOSIS — R251 Tremor, unspecified: Secondary | ICD-10-CM

## 2022-02-18 DIAGNOSIS — E785 Hyperlipidemia, unspecified: Secondary | ICD-10-CM

## 2022-02-18 DIAGNOSIS — F419 Anxiety disorder, unspecified: Secondary | ICD-10-CM

## 2022-02-18 DIAGNOSIS — R739 Hyperglycemia, unspecified: Secondary | ICD-10-CM

## 2022-02-18 DIAGNOSIS — M1990 Unspecified osteoarthritis, unspecified site: Secondary | ICD-10-CM

## 2022-02-18 DIAGNOSIS — I2699 Other pulmonary embolism without acute cor pulmonale: Secondary | ICD-10-CM

## 2022-02-18 DIAGNOSIS — I10 Essential (primary) hypertension: Principal | ICD-10-CM

## 2022-02-18 DIAGNOSIS — IMO0002 Atrial fib/flutter, transient: Secondary | ICD-10-CM

## 2022-02-18 DIAGNOSIS — Z91199 Personal history of noncompliance with medical treatment, presenting hazards to health: Secondary | ICD-10-CM

## 2022-02-18 DIAGNOSIS — Z6837 Body mass index (BMI) 37.0-37.9, adult: Principal | ICD-10-CM

## 2022-02-18 DIAGNOSIS — G459 Transient cerebral ischemic attack, unspecified: Secondary | ICD-10-CM

## 2022-02-18 DIAGNOSIS — J449 Chronic obstructive pulmonary disease, unspecified: Secondary | ICD-10-CM

## 2022-02-18 MED ORDER — SERTRALINE HCL 50 MG PO TABS
3 refills | Status: CP
Start: 2022-02-18 — End: ?

## 2022-02-18 MED ORDER — DICLOFENAC SODIUM 1 % EX GEL
1 refills | Status: CP
Start: 2022-02-18 — End: ?

## 2022-02-18 MED ORDER — VALSARTAN-HYDROCHLOROTHIAZIDE 320-25 MG PO TABS
1 | ORAL_TABLET | Freq: Every day | ORAL | 3 refills | Status: CP
Start: 2022-02-18 — End: ?

## 2022-02-18 MED ORDER — DILTIAZEM HCL ER 180 MG PO CP24
ORAL | 3 refills | 30.00000 days | Status: CP
Start: 2022-02-18 — End: ?

## 2022-02-18 MED ORDER — SEMAGLUTIDE-WEIGHT MANAGEMENT 0.25 MG/0.5ML SC SOAJ
.25 mg | SUBCUTANEOUS | 0 refills | Status: CP
Start: 2022-02-18 — End: ?

## 2022-02-20 DIAGNOSIS — I10 Essential (primary) hypertension: Secondary | ICD-10-CM

## 2022-02-20 DIAGNOSIS — G894 Chronic pain syndrome: Principal | ICD-10-CM

## 2022-02-21 ENCOUNTER — Ambulatory Visit: Payer: Medicare Other | Attending: Internal Medicine | Primary: Internal Medicine

## 2022-02-21 DIAGNOSIS — G459 Transient cerebral ischemic attack, unspecified: Secondary | ICD-10-CM

## 2022-02-21 DIAGNOSIS — M329 Systemic lupus erythematosus, unspecified: Secondary | ICD-10-CM

## 2022-02-21 DIAGNOSIS — J449 Chronic obstructive pulmonary disease, unspecified: Secondary | ICD-10-CM

## 2022-02-21 DIAGNOSIS — I10 Essential (primary) hypertension: Secondary | ICD-10-CM

## 2022-02-21 DIAGNOSIS — I2699 Other pulmonary embolism without acute cor pulmonale: Secondary | ICD-10-CM

## 2022-02-21 DIAGNOSIS — IMO0002 Atrial fib/flutter, transient: Secondary | ICD-10-CM

## 2022-02-21 DIAGNOSIS — M1990 Unspecified osteoarthritis, unspecified site: Secondary | ICD-10-CM

## 2022-02-21 DIAGNOSIS — J984 Other disorders of lung: Secondary | ICD-10-CM

## 2022-02-21 DIAGNOSIS — R251 Tremor, unspecified: Secondary | ICD-10-CM

## 2022-02-21 DIAGNOSIS — Z79899 Other long term (current) drug therapy: Secondary | ICD-10-CM

## 2022-02-21 DIAGNOSIS — M170 Bilateral primary osteoarthritis of knee: Secondary | ICD-10-CM

## 2022-02-21 DIAGNOSIS — M3500 Sicca syndrome, unspecified: Secondary | ICD-10-CM

## 2022-02-21 DIAGNOSIS — R0602 Shortness of breath: Secondary | ICD-10-CM

## 2022-02-21 DIAGNOSIS — I6350 Cerebral infarction due to unspecified occlusion or stenosis of unspecified cerebral artery: Secondary | ICD-10-CM

## 2022-02-21 DIAGNOSIS — E785 Hyperlipidemia, unspecified: Secondary | ICD-10-CM

## 2022-02-21 DIAGNOSIS — Z91199 Personal history of noncompliance with medical treatment, presenting hazards to health: Secondary | ICD-10-CM

## 2022-02-21 DIAGNOSIS — M3219 Other organ or system involvement in systemic lupus erythematosus: Principal | ICD-10-CM

## 2022-02-21 DIAGNOSIS — I8290 Acute embolism and thrombosis of unspecified vein: Secondary | ICD-10-CM

## 2022-02-21 DIAGNOSIS — I2510 Atherosclerotic heart disease of native coronary artery without angina pectoris: Secondary | ICD-10-CM

## 2022-02-21 DIAGNOSIS — M797 Fibromyalgia: Secondary | ICD-10-CM

## 2022-02-21 MED ORDER — VALSARTAN-HYDROCHLOROTHIAZIDE 320-25 MG PO TABS
ORAL_TABLET | 3 refills | Status: CP
Start: 2022-02-21 — End: ?

## 2022-02-21 MED ORDER — PERFLUTREN LIPID MICROSPHERE 6.52 MG/ML IV SUSP
.2-1.3 mL | Freq: Once | INTRAVENOUS | PRN
Start: 2022-02-21 — End: ?

## 2022-02-21 MED ORDER — PREDNISONE 5 MG PO TABS
5 mg | Freq: Every day | ORAL | 0 refills | 30.00 days | Status: CP
Start: 2022-02-21 — End: ?

## 2022-02-21 MED ORDER — GABAPENTIN 600 MG PO TABS
3 refills | 30.00 days | Status: CP
Start: 2022-02-21 — End: ?

## 2022-02-21 MED ORDER — DILTIAZEM HCL ER 180 MG PO CP24
ORAL | 3 refills | 30.00000 days | Status: CP
Start: 2022-02-21 — End: ?

## 2022-02-21 MED ORDER — SERTRALINE HCL 50 MG PO TABS
3 refills | Status: CP
Start: 2022-02-21 — End: ?

## 2022-02-22 ENCOUNTER — Encounter: Payer: Medicare Other | Primary: Internal Medicine

## 2022-02-22 DIAGNOSIS — I10 Essential (primary) hypertension: Secondary | ICD-10-CM

## 2022-02-22 DIAGNOSIS — R0602 Shortness of breath: Principal | ICD-10-CM

## 2022-02-22 MED ORDER — PERFLUTREN LIPID MICROSPHERE 6.52 MG/ML IV SUSP
.2-1.3 mL | Freq: Once | INTRAVENOUS | Status: CP | PRN
Start: 2022-02-22 — End: ?

## 2022-02-26 ENCOUNTER — Ambulatory Visit: Payer: Medicaid - Out of State | Admitting: Physical Therapy

## 2022-02-26 ENCOUNTER — Inpatient Hospital Stay: Admit: 2022-02-26 | Discharge: 2022-02-27 | Payer: Medicare Other | Primary: Internal Medicine

## 2022-02-26 DIAGNOSIS — J984 Other disorders of lung: Principal | ICD-10-CM

## 2022-02-26 DIAGNOSIS — R0602 Shortness of breath: Secondary | ICD-10-CM

## 2022-03-01 ENCOUNTER — Ambulatory Visit: Payer: Medicaid - Out of State | Admitting: Physical Therapy

## 2022-03-01 ENCOUNTER — Ambulatory Visit: Payer: Medicare Other | Attending: Ophthalmology | Primary: Internal Medicine

## 2022-03-01 DIAGNOSIS — IMO0002 Atrial fib/flutter, transient: Secondary | ICD-10-CM

## 2022-03-01 DIAGNOSIS — H43393 Other vitreous opacities, bilateral: Secondary | ICD-10-CM

## 2022-03-01 DIAGNOSIS — S0510XA Contusion of eyeball and orbital tissues, unspecified eye, initial encounter: Secondary | ICD-10-CM

## 2022-03-01 DIAGNOSIS — M3501 Sicca syndrome with keratoconjunctivitis: Secondary | ICD-10-CM

## 2022-03-01 DIAGNOSIS — I2510 Atherosclerotic heart disease of native coronary artery without angina pectoris: Secondary | ICD-10-CM

## 2022-03-01 DIAGNOSIS — G459 Transient cerebral ischemic attack, unspecified: Secondary | ICD-10-CM

## 2022-03-01 DIAGNOSIS — M1990 Unspecified osteoarthritis, unspecified site: Secondary | ICD-10-CM

## 2022-03-01 DIAGNOSIS — M3500 Sicca syndrome, unspecified: Secondary | ICD-10-CM

## 2022-03-01 DIAGNOSIS — R251 Tremor, unspecified: Secondary | ICD-10-CM

## 2022-03-01 DIAGNOSIS — M329 Systemic lupus erythematosus, unspecified: Secondary | ICD-10-CM

## 2022-03-01 DIAGNOSIS — I8290 Acute embolism and thrombosis of unspecified vein: Secondary | ICD-10-CM

## 2022-03-01 DIAGNOSIS — I2699 Other pulmonary embolism without acute cor pulmonale: Secondary | ICD-10-CM

## 2022-03-01 DIAGNOSIS — D649 Anemia, unspecified: Secondary | ICD-10-CM

## 2022-03-01 DIAGNOSIS — E785 Hyperlipidemia, unspecified: Secondary | ICD-10-CM

## 2022-03-01 DIAGNOSIS — Z91199 Personal history of noncompliance with medical treatment, presenting hazards to health: Secondary | ICD-10-CM

## 2022-03-01 DIAGNOSIS — H2513 Age-related nuclear cataract, bilateral: Secondary | ICD-10-CM

## 2022-03-01 DIAGNOSIS — Z79899 Other long term (current) drug therapy: Principal | ICD-10-CM

## 2022-03-01 DIAGNOSIS — G43909 Migraine, unspecified, not intractable, without status migrainosus: Secondary | ICD-10-CM

## 2022-03-01 DIAGNOSIS — I10 Essential (primary) hypertension: Principal | ICD-10-CM

## 2022-03-01 DIAGNOSIS — I6350 Cerebral infarction due to unspecified occlusion or stenosis of unspecified cerebral artery: Secondary | ICD-10-CM

## 2022-03-01 DIAGNOSIS — J449 Chronic obstructive pulmonary disease, unspecified: Secondary | ICD-10-CM

## 2022-03-02 DIAGNOSIS — M3501 Sicca syndrome with keratoconjunctivitis: Principal | ICD-10-CM

## 2022-03-02 DIAGNOSIS — M170 Bilateral primary osteoarthritis of knee: Principal | ICD-10-CM

## 2022-03-02 DIAGNOSIS — M797 Fibromyalgia: Secondary | ICD-10-CM

## 2022-03-02 MED ORDER — CYCLOBENZAPRINE HCL 10 MG PO TABS
1 refills | Status: CP
Start: 2022-03-02 — End: ?

## 2022-03-02 MED ORDER — DICLOFENAC SODIUM 1 % EX GEL
TOPICAL | 3 refills | 13.00000 days | Status: CP
Start: 2022-03-02 — End: ?

## 2022-03-03 MED ORDER — RESTASIS MULTIDOSE 0.05 % OP EMUL
OPHTHALMIC | 11 refills | 15.00000 days | Status: CP
Start: 2022-03-03 — End: ?

## 2022-03-05 DIAGNOSIS — M3219 Other organ or system involvement in systemic lupus erythematosus: Principal | ICD-10-CM

## 2022-03-05 DIAGNOSIS — M25561 Pain in right knee: Secondary | ICD-10-CM

## 2022-03-05 DIAGNOSIS — G8929 Other chronic pain: Secondary | ICD-10-CM

## 2022-03-05 DIAGNOSIS — M25562 Pain in left knee: Secondary | ICD-10-CM

## 2022-03-05 DIAGNOSIS — M797 Fibromyalgia: Secondary | ICD-10-CM

## 2022-03-08 ENCOUNTER — Ambulatory Visit: Payer: 59 | Admitting: Family

## 2022-03-08 ENCOUNTER — Ambulatory Visit: Payer: Medicare Other | Attending: Ophthalmology | Primary: Internal Medicine

## 2022-03-08 DIAGNOSIS — Z79899 Other long term (current) drug therapy: Principal | ICD-10-CM

## 2022-03-11 DIAGNOSIS — G43009 Migraine without aura, not intractable, without status migrainosus: Principal | ICD-10-CM

## 2022-03-12 ENCOUNTER — Encounter: Payer: 59 | Admitting: Physical Therapy

## 2022-03-12 DIAGNOSIS — R739 Hyperglycemia, unspecified: Principal | ICD-10-CM

## 2022-03-12 MED ORDER — AIMOVIG 140 MG/ML SC SOAJ
SUBCUTANEOUS | 3 refills | 30.00000 days | Status: CP
Start: 2022-03-12 — End: ?

## 2022-03-12 MED ORDER — ONETOUCH VERIO W/DEVICE KIT
0 refills | Status: CP
Start: 2022-03-12 — End: ?

## 2022-03-12 MED ORDER — LANCETS 33G MISC
5 refills | Status: CP
Start: 2022-03-12 — End: ?

## 2022-03-12 MED ORDER — ONETOUCH VERIO VI STRP
ORAL_STRIP | 5 refills | Status: CP
Start: 2022-03-12 — End: ?

## 2022-03-21 ENCOUNTER — Ambulatory Visit: Payer: Medicare Other | Attending: Family Medicine | Primary: Internal Medicine

## 2022-03-21 DIAGNOSIS — F329 Major depressive disorder, single episode, unspecified: Secondary | ICD-10-CM

## 2022-03-21 DIAGNOSIS — R251 Tremor, unspecified: Secondary | ICD-10-CM

## 2022-03-21 DIAGNOSIS — M1990 Unspecified osteoarthritis, unspecified site: Secondary | ICD-10-CM

## 2022-03-21 DIAGNOSIS — M3500 Sicca syndrome, unspecified: Secondary | ICD-10-CM

## 2022-03-21 DIAGNOSIS — G459 Transient cerebral ischemic attack, unspecified: Secondary | ICD-10-CM

## 2022-03-21 DIAGNOSIS — G894 Chronic pain syndrome: Secondary | ICD-10-CM

## 2022-03-21 DIAGNOSIS — I8290 Acute embolism and thrombosis of unspecified vein: Secondary | ICD-10-CM

## 2022-03-21 DIAGNOSIS — S0510XA Contusion of eyeball and orbital tissues, unspecified eye, initial encounter: Secondary | ICD-10-CM

## 2022-03-21 DIAGNOSIS — I2699 Other pulmonary embolism without acute cor pulmonale: Secondary | ICD-10-CM

## 2022-03-21 DIAGNOSIS — I2510 Atherosclerotic heart disease of native coronary artery without angina pectoris: Secondary | ICD-10-CM

## 2022-03-21 DIAGNOSIS — Z91199 Personal history of noncompliance with medical treatment, presenting hazards to health: Secondary | ICD-10-CM

## 2022-03-21 DIAGNOSIS — I6350 Cerebral infarction due to unspecified occlusion or stenosis of unspecified cerebral artery: Secondary | ICD-10-CM

## 2022-03-21 DIAGNOSIS — E785 Hyperlipidemia, unspecified: Secondary | ICD-10-CM

## 2022-03-21 DIAGNOSIS — J449 Chronic obstructive pulmonary disease, unspecified: Secondary | ICD-10-CM

## 2022-03-21 DIAGNOSIS — I10 Essential (primary) hypertension: Secondary | ICD-10-CM

## 2022-03-21 DIAGNOSIS — G43909 Migraine, unspecified, not intractable, without status migrainosus: Secondary | ICD-10-CM

## 2022-03-21 DIAGNOSIS — D649 Anemia, unspecified: Secondary | ICD-10-CM

## 2022-03-21 DIAGNOSIS — IMO0002 Atrial fib/flutter, transient: Secondary | ICD-10-CM

## 2022-03-21 DIAGNOSIS — M170 Bilateral primary osteoarthritis of knee: Principal | ICD-10-CM

## 2022-03-21 DIAGNOSIS — Z6836 Body mass index (BMI) 36.0-36.9, adult: Secondary | ICD-10-CM

## 2022-03-21 DIAGNOSIS — R942 Abnormal results of pulmonary function studies: Secondary | ICD-10-CM

## 2022-03-21 DIAGNOSIS — M329 Systemic lupus erythematosus, unspecified: Secondary | ICD-10-CM

## 2022-03-21 MED ORDER — DULOXETINE HCL 30 MG PO CPEP
30 mg | Freq: Every day | ORAL | 1 refills | Status: CP
Start: 2022-03-21 — End: ?

## 2022-04-01 ENCOUNTER — Ambulatory Visit: Payer: Medicare Other | Attending: Internal Medicine | Primary: Internal Medicine

## 2022-04-01 DIAGNOSIS — G459 Transient cerebral ischemic attack, unspecified: Secondary | ICD-10-CM

## 2022-04-01 DIAGNOSIS — M3500 Sicca syndrome, unspecified: Secondary | ICD-10-CM

## 2022-04-01 DIAGNOSIS — J449 Chronic obstructive pulmonary disease, unspecified: Secondary | ICD-10-CM

## 2022-04-01 DIAGNOSIS — M797 Fibromyalgia: Principal | ICD-10-CM

## 2022-04-01 DIAGNOSIS — I2699 Other pulmonary embolism without acute cor pulmonale: Secondary | ICD-10-CM

## 2022-04-01 DIAGNOSIS — Z91199 Personal history of noncompliance with medical treatment, presenting hazards to health: Secondary | ICD-10-CM

## 2022-04-01 DIAGNOSIS — M329 Systemic lupus erythematosus, unspecified: Secondary | ICD-10-CM

## 2022-04-01 DIAGNOSIS — IMO0002 Atrial fib/flutter, transient: Secondary | ICD-10-CM

## 2022-04-01 DIAGNOSIS — I6350 Cerebral infarction due to unspecified occlusion or stenosis of unspecified cerebral artery: Secondary | ICD-10-CM

## 2022-04-01 DIAGNOSIS — D649 Anemia, unspecified: Secondary | ICD-10-CM

## 2022-04-01 DIAGNOSIS — M1990 Unspecified osteoarthritis, unspecified site: Secondary | ICD-10-CM

## 2022-04-01 DIAGNOSIS — I2510 Atherosclerotic heart disease of native coronary artery without angina pectoris: Secondary | ICD-10-CM

## 2022-04-01 DIAGNOSIS — G43909 Migraine, unspecified, not intractable, without status migrainosus: Secondary | ICD-10-CM

## 2022-04-01 DIAGNOSIS — I10 Essential (primary) hypertension: Principal | ICD-10-CM

## 2022-04-01 DIAGNOSIS — E785 Hyperlipidemia, unspecified: Secondary | ICD-10-CM

## 2022-04-01 DIAGNOSIS — I8290 Acute embolism and thrombosis of unspecified vein: Secondary | ICD-10-CM

## 2022-04-01 DIAGNOSIS — R251 Tremor, unspecified: Secondary | ICD-10-CM

## 2022-04-01 DIAGNOSIS — M3219 Other organ or system involvement in systemic lupus erythematosus: Secondary | ICD-10-CM

## 2022-04-01 DIAGNOSIS — S0510XA Contusion of eyeball and orbital tissues, unspecified eye, initial encounter: Secondary | ICD-10-CM

## 2022-04-01 MED ORDER — CYCLOBENZAPRINE HCL 10 MG PO TABS
10 mg | Freq: Every evening | ORAL | 1 refills | Status: CP
Start: 2022-04-01 — End: ?

## 2022-04-01 MED ORDER — PREGABALIN 75 MG PO CAPS
75 mg | Freq: Every day | ORAL | 0 refills | 30.00000 days | Status: CP
Start: 2022-04-01 — End: ?

## 2022-04-01 MED ORDER — PREDNISONE 5 MG PO TABS
5 mg | Freq: Every day | ORAL | 0 refills | 6.00000 days | Status: CP
Start: 2022-04-01 — End: ?

## 2022-04-01 MED ORDER — HYDROXYCHLOROQUINE SULFATE 200 MG PO TABS
200 mg | Freq: Two times a day (BID) | ORAL | 2 refills | 30.00000 days | Status: CP
Start: 2022-04-01 — End: ?

## 2022-04-04 ENCOUNTER — Ambulatory Visit: Payer: Medicare Other | Attending: Family Medicine | Primary: Internal Medicine

## 2022-04-04 DIAGNOSIS — M3500 Sicca syndrome, unspecified: Secondary | ICD-10-CM

## 2022-04-04 DIAGNOSIS — R251 Tremor, unspecified: Secondary | ICD-10-CM

## 2022-04-04 DIAGNOSIS — M25561 Pain in right knee: Secondary | ICD-10-CM

## 2022-04-04 DIAGNOSIS — F329 Major depressive disorder, single episode, unspecified: Secondary | ICD-10-CM

## 2022-04-04 DIAGNOSIS — G43909 Migraine, unspecified, not intractable, without status migrainosus: Secondary | ICD-10-CM

## 2022-04-04 DIAGNOSIS — M329 Systemic lupus erythematosus, unspecified: Secondary | ICD-10-CM

## 2022-04-04 DIAGNOSIS — I2510 Atherosclerotic heart disease of native coronary artery without angina pectoris: Secondary | ICD-10-CM

## 2022-04-04 DIAGNOSIS — E785 Hyperlipidemia, unspecified: Secondary | ICD-10-CM

## 2022-04-04 DIAGNOSIS — I8290 Acute embolism and thrombosis of unspecified vein: Secondary | ICD-10-CM

## 2022-04-04 DIAGNOSIS — G8929 Other chronic pain: Secondary | ICD-10-CM

## 2022-04-04 DIAGNOSIS — J449 Chronic obstructive pulmonary disease, unspecified: Secondary | ICD-10-CM

## 2022-04-04 DIAGNOSIS — G459 Transient cerebral ischemic attack, unspecified: Secondary | ICD-10-CM

## 2022-04-04 DIAGNOSIS — I10 Essential (primary) hypertension: Principal | ICD-10-CM

## 2022-04-04 DIAGNOSIS — D649 Anemia, unspecified: Secondary | ICD-10-CM

## 2022-04-04 DIAGNOSIS — Z91199 Personal history of noncompliance with medical treatment, presenting hazards to health: Secondary | ICD-10-CM

## 2022-04-04 DIAGNOSIS — IMO0002 Atrial fib/flutter, transient: Secondary | ICD-10-CM

## 2022-04-04 DIAGNOSIS — S0510XA Contusion of eyeball and orbital tissues, unspecified eye, initial encounter: Secondary | ICD-10-CM

## 2022-04-04 DIAGNOSIS — M25562 Pain in left knee: Secondary | ICD-10-CM

## 2022-04-04 DIAGNOSIS — I6350 Cerebral infarction due to unspecified occlusion or stenosis of unspecified cerebral artery: Secondary | ICD-10-CM

## 2022-04-04 DIAGNOSIS — M170 Bilateral primary osteoarthritis of knee: Secondary | ICD-10-CM

## 2022-04-04 DIAGNOSIS — M1990 Unspecified osteoarthritis, unspecified site: Secondary | ICD-10-CM

## 2022-04-04 DIAGNOSIS — I2699 Other pulmonary embolism without acute cor pulmonale: Secondary | ICD-10-CM

## 2022-04-04 DIAGNOSIS — Z6836 Body mass index (BMI) 36.0-36.9, adult: Principal | ICD-10-CM

## 2022-04-04 MED ORDER — DULOXETINE HCL 30 MG PO CPEP
30 mg | Freq: Two times a day (BID) | ORAL | 2 refills | Status: CP
Start: 2022-04-04 — End: ?

## 2022-04-08 DIAGNOSIS — M25561 Pain in right knee: Principal | ICD-10-CM

## 2022-04-08 DIAGNOSIS — M25562 Pain in left knee: Secondary | ICD-10-CM

## 2022-04-09 ENCOUNTER — Encounter: Payer: Medicare Other | Primary: Internal Medicine

## 2022-04-09 ENCOUNTER — Ambulatory Visit: Payer: Medicare Other | Attending: Emergency Medicine | Primary: Internal Medicine

## 2022-04-09 DIAGNOSIS — M329 Systemic lupus erythematosus, unspecified: Secondary | ICD-10-CM

## 2022-04-09 DIAGNOSIS — M25561 Pain in right knee: Principal | ICD-10-CM

## 2022-04-09 DIAGNOSIS — M659 Synovitis and tenosynovitis, unspecified: Principal | ICD-10-CM

## 2022-04-09 DIAGNOSIS — M25562 Pain in left knee: Principal | ICD-10-CM

## 2022-04-09 DIAGNOSIS — M170 Bilateral primary osteoarthritis of knee: Secondary | ICD-10-CM

## 2022-04-16 ENCOUNTER — Ambulatory Visit: Payer: Medicare Other | Attending: Sports Medicine | Primary: Internal Medicine

## 2022-04-16 DIAGNOSIS — M1711 Unilateral primary osteoarthritis, right knee: Principal | ICD-10-CM

## 2022-04-16 MED ORDER — CELECOXIB 200 MG PO CAPS
400 mg | Freq: Once | ORAL
Start: 2022-04-16 — End: ?

## 2022-04-16 MED ORDER — GABAPENTIN 300 MG PO CAPS
600 mg | Freq: Once | ORAL
Start: 2022-04-16 — End: ?

## 2022-04-16 MED ORDER — VANCOMYCIN HCL 1500 MG IV SOLR MBP/V2B KIT JX
1500 mg | Freq: Once | INTRAVENOUS
Start: 2022-04-16 — End: ?

## 2022-04-16 MED ORDER — SODIUM CHLORIDE 0.9 % IV SOLN
1000 mL | INTRAVENOUS
Start: 2022-04-16 — End: ?

## 2022-04-16 MED ORDER — CEFAZOLIN SODIUM 1 G IJ SOLR
2 g | Freq: Once | INTRAVENOUS
Start: 2022-04-16 — End: ?

## 2022-04-18 ENCOUNTER — Encounter: Payer: Medicaid Other | Attending: Family Medicine | Primary: Internal Medicine

## 2022-04-23 IMAGING — MR MR KNEE*L* W/O CM
7 series · 40 of 40 positions shown · non-contrast
Comparison: None.

CLINICAL DATA: Bilateral knee pain for 20 years.

EXAM:
MRI OF THE LEFT KNEE WITHOUT CONTRAST
TECHNIQUE: Multiplanar, multisequence MR imaging of the knee was performed. No
intravenous contrast was administered.

[Series 3: T2 fat-sat · axial · 4.0mm · 0.62mm/px · z∈[-77,+93]mm · 6 of 35 slices shown (1 of 3)]
[im 1/35]
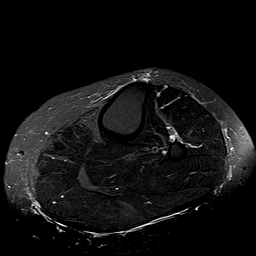
[im 7/35]
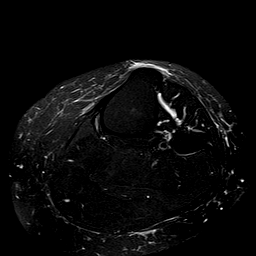
[im 14/35]
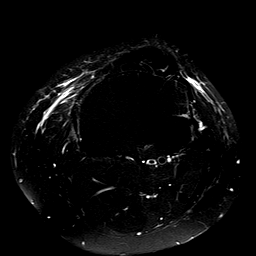
[im 21/35]
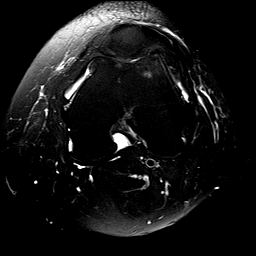
[im 28/35]
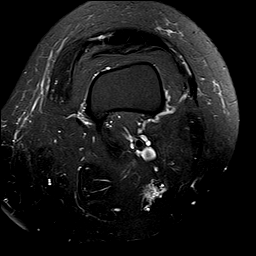
[im 35/35]
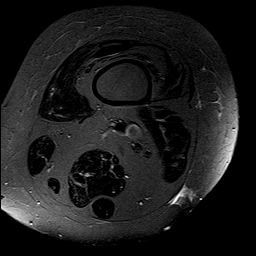

[Series 4: T1 · coronal · 4.0mm · 0.59mm/px · 6 of 31 slices shown]
[im 1/31]
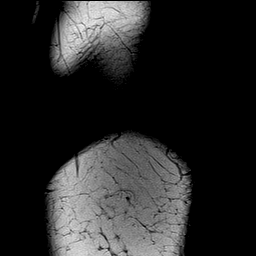
[im 7/31]
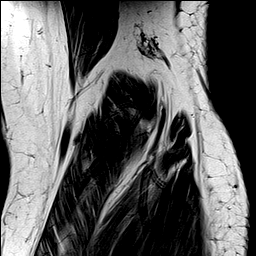
[im 13/31]
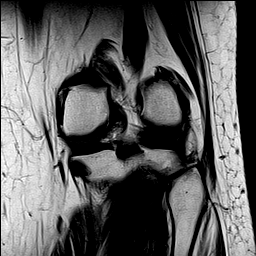
[im 19/31]
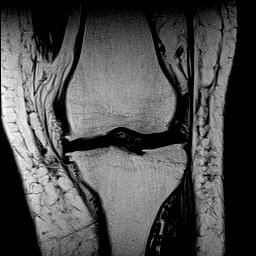
[im 25/31]
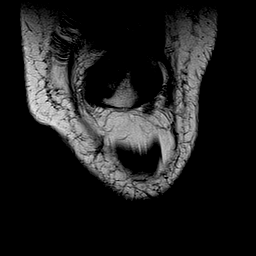
[im 31/31]
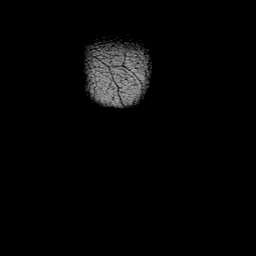

[Series 5: T2 fat-sat · coronal · 4.0mm · 0.59mm/px · 6 of 31 slices shown (2 of 3)]
[im 1/31]
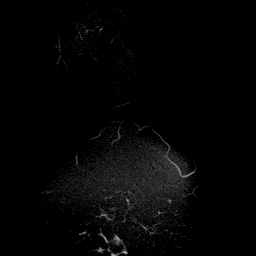
[im 7/31]
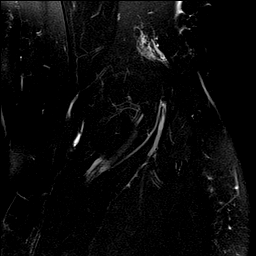
[im 13/31]
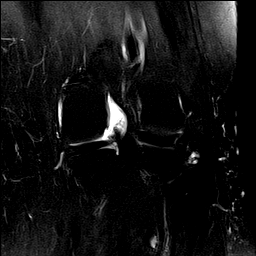
[im 19/31]
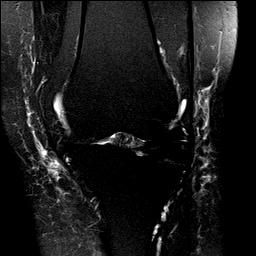
[im 25/31]
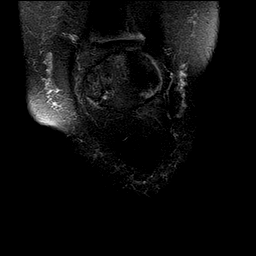
[im 31/31]
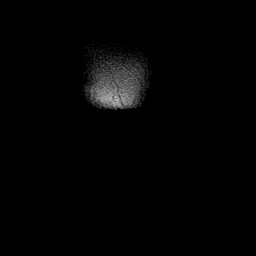

[Series 6: PD fat-sat · coronal · 4.0mm · 0.59mm/px · 6 of 31 slices shown (1 of 3)]
[im 1/31]
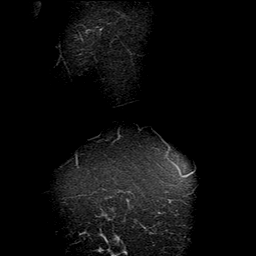
[im 7/31]
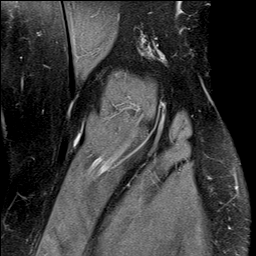
[im 13/31]
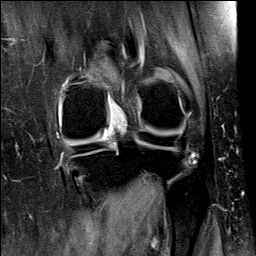
[im 19/31]
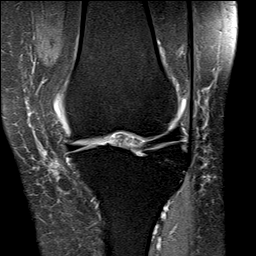
[im 25/31]
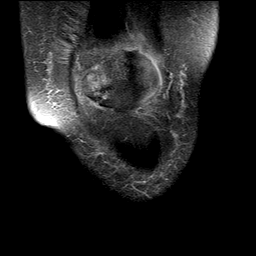
[im 31/31]
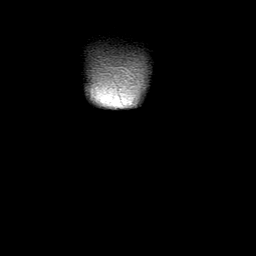

[Series 7: PD fat-sat · sagittal · 3.0mm · 0.59mm/px · 6 of 35 slices shown (2 of 3)]
[im 1/35]
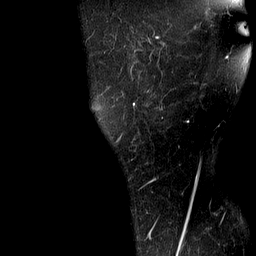
[im 7/35]
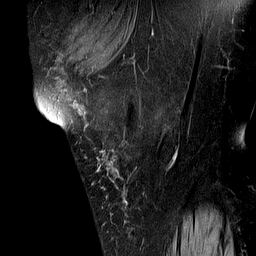
[im 14/35]
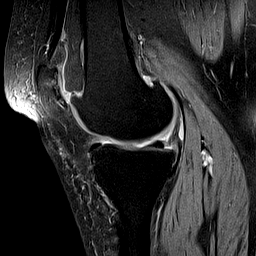
[im 21/35]
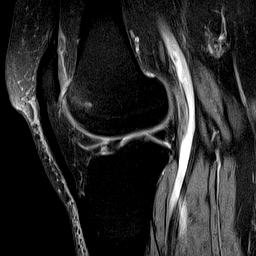
[im 28/35]
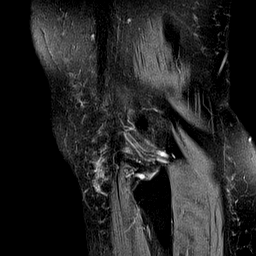
[im 35/35]
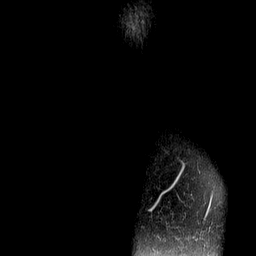

[Series 8: T2 fat-sat · sagittal · 3.0mm · 0.59mm/px · 6 of 35 slices shown (3 of 3)]
[im 1/35]
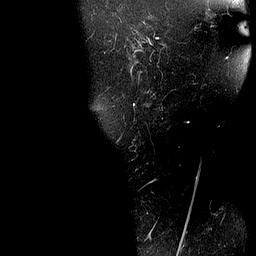
[im 7/35]
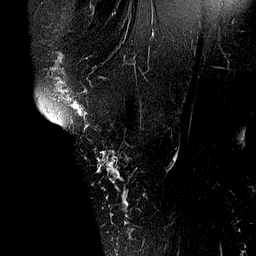
[im 14/35]
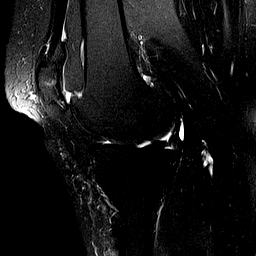
[im 21/35]
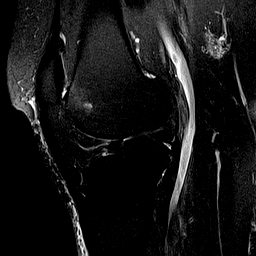
[im 28/35]
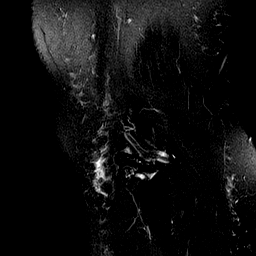
[im 35/35]
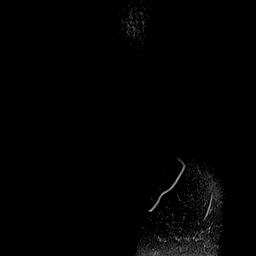

[Series 9: PD fat-sat · coronal · 2.0mm · 0.59mm/px · 4 of 19 slices shown (3 of 3)]
[im 1/19]
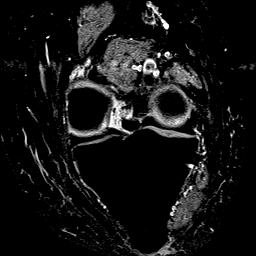
[im 7/19]
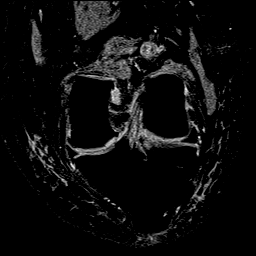
[im 13/19]
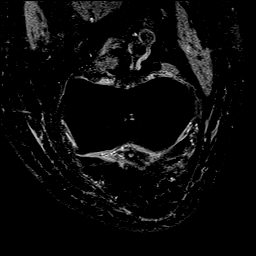
[im 19/19]
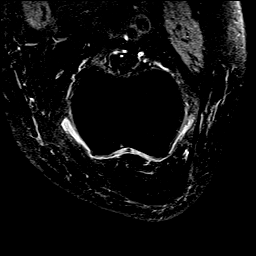

[40 of 40 positions shown; findings below may reference images not displayed]

FINDINGS: MENISCI

Medial: Intact.

Lateral: Intact.

LIGAMENTS

Cruciates: ACL and PCL are intact.

Collaterals: Medial collateral ligament is intact. Lateral
collateral ligament complex is intact.

CARTILAGE

Patellofemoral: High-grade partial-thickness cartilage loss with
areas of full-thickness cartilage loss of the patellofemoral
compartment with subchondral reactive marrow edema in the medial
patellar facet and lateral trochlea.

Medial: Partial-thickness cartilage loss of the medial femorotibial
compartment.

Lateral:  Cartilage irregularity without a focal chondral defect.

JOINT: No joint effusion. Normal Sandro Ariel Gait. No plical
thickening.

POPLITEAL FOSSA: Popliteus tendon is intact. Small Baker's cyst.

EXTENSOR MECHANISM: Intact quadriceps tendon. Intact patellar
tendon. Intact lateral patellar retinaculum. Intact medial patellar
retinaculum. Intact MPFL.

BONES: No aggressive osseous lesion. No fracture or dislocation.

Other: No fluid collection or hematoma. Muscles are normal.
IMPRESSION: 1. No meniscal or ligamentous injury of the left knee.
2. High-grade partial-thickness cartilage loss with areas of
full-thickness cartilage loss of the patellofemoral compartment with
subchondral reactive marrow edema in the medial patellar facet and
lateral trochlea consistent with severe osteoarthritis.
3. Partial-thickness cartilage loss of the medial femorotibial
compartment.

## 2022-04-29 ENCOUNTER — Ambulatory Visit: Payer: Medicaid Other | Attending: Family Medicine | Primary: Internal Medicine

## 2022-04-29 ENCOUNTER — Inpatient Hospital Stay: Admit: 2022-04-29 | Discharge: 2022-04-30 | Payer: Medicaid Other | Primary: Internal Medicine

## 2022-04-29 DIAGNOSIS — E785 Hyperlipidemia, unspecified: Secondary | ICD-10-CM

## 2022-04-29 DIAGNOSIS — M3500 Sicca syndrome, unspecified: Secondary | ICD-10-CM

## 2022-04-29 DIAGNOSIS — G8929 Other chronic pain: Secondary | ICD-10-CM

## 2022-04-29 DIAGNOSIS — G43909 Migraine, unspecified, not intractable, without status migrainosus: Secondary | ICD-10-CM

## 2022-04-29 DIAGNOSIS — I6350 Cerebral infarction due to unspecified occlusion or stenosis of unspecified cerebral artery: Secondary | ICD-10-CM

## 2022-04-29 DIAGNOSIS — M329 Systemic lupus erythematosus, unspecified: Secondary | ICD-10-CM

## 2022-04-29 DIAGNOSIS — M3501 Sicca syndrome with keratoconjunctivitis: Secondary | ICD-10-CM

## 2022-04-29 DIAGNOSIS — Z01810 Encounter for preprocedural cardiovascular examination: Secondary | ICD-10-CM

## 2022-04-29 DIAGNOSIS — I2510 Atherosclerotic heart disease of native coronary artery without angina pectoris: Secondary | ICD-10-CM

## 2022-04-29 DIAGNOSIS — G894 Chronic pain syndrome: Secondary | ICD-10-CM

## 2022-04-29 DIAGNOSIS — M1990 Unspecified osteoarthritis, unspecified site: Secondary | ICD-10-CM

## 2022-04-29 DIAGNOSIS — G459 Transient cerebral ischemic attack, unspecified: Secondary | ICD-10-CM

## 2022-04-29 DIAGNOSIS — M25562 Pain in left knee: Secondary | ICD-10-CM

## 2022-04-29 DIAGNOSIS — I8290 Acute embolism and thrombosis of unspecified vein: Secondary | ICD-10-CM

## 2022-04-29 DIAGNOSIS — D649 Anemia, unspecified: Secondary | ICD-10-CM

## 2022-04-29 DIAGNOSIS — I2699 Other pulmonary embolism without acute cor pulmonale: Secondary | ICD-10-CM

## 2022-04-29 DIAGNOSIS — H6983 Other specified disorders of Eustachian tube, bilateral: Secondary | ICD-10-CM

## 2022-04-29 DIAGNOSIS — M25561 Pain in right knee: Secondary | ICD-10-CM

## 2022-04-29 DIAGNOSIS — J449 Chronic obstructive pulmonary disease, unspecified: Secondary | ICD-10-CM

## 2022-04-29 DIAGNOSIS — S0510XA Contusion of eyeball and orbital tissues, unspecified eye, initial encounter: Secondary | ICD-10-CM

## 2022-04-29 DIAGNOSIS — Z91199 Personal history of noncompliance with medical treatment, presenting hazards to health: Secondary | ICD-10-CM

## 2022-04-29 DIAGNOSIS — IMO0002 Atrial fib/flutter, transient: Secondary | ICD-10-CM

## 2022-04-29 DIAGNOSIS — Z6836 Body mass index (BMI) 36.0-36.9, adult: Principal | ICD-10-CM

## 2022-04-29 DIAGNOSIS — I10 Essential (primary) hypertension: Principal | ICD-10-CM

## 2022-04-29 DIAGNOSIS — R251 Tremor, unspecified: Secondary | ICD-10-CM

## 2022-04-29 DIAGNOSIS — F329 Major depressive disorder, single episode, unspecified: Secondary | ICD-10-CM

## 2022-05-02 DIAGNOSIS — D72819 Decreased white blood cell count, unspecified: Principal | ICD-10-CM

## 2022-05-06 ENCOUNTER — Encounter: Payer: Medicaid Other | Attending: Family Medicine | Primary: Internal Medicine

## 2022-05-07 ENCOUNTER — Ambulatory Visit
Admit: 2022-05-07 | Discharge: 2022-05-08 | Payer: Medicaid Other | Attending: Medical Oncology | Primary: Internal Medicine

## 2022-05-07 DIAGNOSIS — D72819 Decreased white blood cell count, unspecified: Principal | ICD-10-CM

## 2022-05-09 DIAGNOSIS — H6983 Other specified disorders of Eustachian tube, bilateral: Principal | ICD-10-CM

## 2022-05-09 MED ORDER — FLUTICASONE PROPIONATE 50 MCG/ACT NA SUSP
1 | Freq: Every day | NASAL | 1 refills | Status: CP
Start: 2022-05-09 — End: ?

## 2022-05-21 ENCOUNTER — Ambulatory Visit: Payer: Medicaid Other | Attending: Internal Medicine | Primary: Internal Medicine

## 2022-05-21 ENCOUNTER — Ambulatory Visit
Admit: 2022-05-21 | Discharge: 2022-05-22 | Payer: Medicaid Other | Attending: Medical Oncology | Primary: Internal Medicine

## 2022-05-21 DIAGNOSIS — M797 Fibromyalgia: Secondary | ICD-10-CM

## 2022-05-21 DIAGNOSIS — M329 Systemic lupus erythematosus, unspecified: Secondary | ICD-10-CM

## 2022-05-21 DIAGNOSIS — M3500 Sicca syndrome, unspecified: Secondary | ICD-10-CM

## 2022-05-21 DIAGNOSIS — I10 Essential (primary) hypertension: Principal | ICD-10-CM

## 2022-05-21 DIAGNOSIS — I2510 Atherosclerotic heart disease of native coronary artery without angina pectoris: Secondary | ICD-10-CM

## 2022-05-21 DIAGNOSIS — Z91199 Personal history of noncompliance with medical treatment, presenting hazards to health: Secondary | ICD-10-CM

## 2022-05-21 DIAGNOSIS — J449 Chronic obstructive pulmonary disease, unspecified: Secondary | ICD-10-CM

## 2022-05-21 DIAGNOSIS — M3219 Other organ or system involvement in systemic lupus erythematosus: Secondary | ICD-10-CM

## 2022-05-21 DIAGNOSIS — M170 Bilateral primary osteoarthritis of knee: Secondary | ICD-10-CM

## 2022-05-21 DIAGNOSIS — I2699 Other pulmonary embolism without acute cor pulmonale: Secondary | ICD-10-CM

## 2022-05-21 DIAGNOSIS — D72819 Decreased white blood cell count, unspecified: Principal | ICD-10-CM

## 2022-05-21 DIAGNOSIS — R251 Tremor, unspecified: Secondary | ICD-10-CM

## 2022-05-21 DIAGNOSIS — D649 Anemia, unspecified: Secondary | ICD-10-CM

## 2022-05-21 DIAGNOSIS — E785 Hyperlipidemia, unspecified: Secondary | ICD-10-CM

## 2022-05-21 DIAGNOSIS — Z79899 Other long term (current) drug therapy: Secondary | ICD-10-CM

## 2022-05-21 DIAGNOSIS — IMO0002 Atrial fib/flutter, transient: Secondary | ICD-10-CM

## 2022-05-21 DIAGNOSIS — G43909 Migraine, unspecified, not intractable, without status migrainosus: Secondary | ICD-10-CM

## 2022-05-21 DIAGNOSIS — I6350 Cerebral infarction due to unspecified occlusion or stenosis of unspecified cerebral artery: Secondary | ICD-10-CM

## 2022-05-21 DIAGNOSIS — S0510XA Contusion of eyeball and orbital tissues, unspecified eye, initial encounter: Secondary | ICD-10-CM

## 2022-05-21 DIAGNOSIS — M1990 Unspecified osteoarthritis, unspecified site: Secondary | ICD-10-CM

## 2022-05-21 DIAGNOSIS — G459 Transient cerebral ischemic attack, unspecified: Secondary | ICD-10-CM

## 2022-05-21 DIAGNOSIS — I8290 Acute embolism and thrombosis of unspecified vein: Secondary | ICD-10-CM

## 2022-05-21 MED ORDER — PREDNISONE 5 MG PO TABS
5 mg | Freq: Every day | ORAL | 0 refills | 7.00 days | Status: CP
Start: 2022-05-21 — End: ?

## 2022-05-21 MED ORDER — PREGABALIN 75 MG PO CAPS
75 mg | Freq: Two times a day (BID) | ORAL | 4 refills | 30.00000 days | Status: CP
Start: 2022-05-21 — End: ?

## 2022-05-27 DIAGNOSIS — M797 Fibromyalgia: Principal | ICD-10-CM

## 2022-05-27 MED ORDER — PREGABALIN 75 MG PO CAPS
75 mg | Freq: Two times a day (BID) | ORAL | 4 refills | Status: CN
Start: 2022-05-27 — End: ?

## 2022-05-29 DIAGNOSIS — M1711 Unilateral primary osteoarthritis, right knee: Principal | ICD-10-CM

## 2022-05-31 ENCOUNTER — Inpatient Hospital Stay: Admit: 2022-05-31 | Payer: Medicare Other | Primary: Internal Medicine

## 2022-05-31 DIAGNOSIS — I2699 Other pulmonary embolism without acute cor pulmonale: Secondary | ICD-10-CM

## 2022-05-31 DIAGNOSIS — Z9109 Other allergy status, other than to drugs and biological substances: Secondary | ICD-10-CM

## 2022-05-31 DIAGNOSIS — Z01810 Encounter for preprocedural cardiovascular examination: Principal | ICD-10-CM

## 2022-05-31 DIAGNOSIS — M329 Systemic lupus erythematosus, unspecified: Secondary | ICD-10-CM

## 2022-05-31 DIAGNOSIS — I6350 Cerebral infarction due to unspecified occlusion or stenosis of unspecified cerebral artery: Secondary | ICD-10-CM

## 2022-05-31 DIAGNOSIS — I8290 Acute embolism and thrombosis of unspecified vein: Secondary | ICD-10-CM

## 2022-05-31 DIAGNOSIS — G459 Transient cerebral ischemic attack, unspecified: Secondary | ICD-10-CM

## 2022-05-31 DIAGNOSIS — D649 Anemia, unspecified: Secondary | ICD-10-CM

## 2022-05-31 DIAGNOSIS — I10 Essential (primary) hypertension: Principal | ICD-10-CM

## 2022-05-31 DIAGNOSIS — Z9189 Other specified personal risk factors, not elsewhere classified: Secondary | ICD-10-CM

## 2022-05-31 DIAGNOSIS — J449 Chronic obstructive pulmonary disease, unspecified: Secondary | ICD-10-CM

## 2022-05-31 DIAGNOSIS — I2510 Atherosclerotic heart disease of native coronary artery without angina pectoris: Secondary | ICD-10-CM

## 2022-05-31 DIAGNOSIS — R011 Cardiac murmur, unspecified: Secondary | ICD-10-CM

## 2022-05-31 DIAGNOSIS — R251 Tremor, unspecified: Secondary | ICD-10-CM

## 2022-05-31 DIAGNOSIS — Z91199 Personal history of noncompliance with medical treatment, presenting hazards to health: Secondary | ICD-10-CM

## 2022-05-31 DIAGNOSIS — M3500 Sicca syndrome, unspecified: Secondary | ICD-10-CM

## 2022-05-31 DIAGNOSIS — F419 Anxiety disorder, unspecified: Secondary | ICD-10-CM

## 2022-05-31 DIAGNOSIS — E785 Hyperlipidemia, unspecified: Secondary | ICD-10-CM

## 2022-05-31 DIAGNOSIS — M1990 Unspecified osteoarthritis, unspecified site: Secondary | ICD-10-CM

## 2022-05-31 DIAGNOSIS — K219 Gastro-esophageal reflux disease without esophagitis: Secondary | ICD-10-CM

## 2022-05-31 DIAGNOSIS — G43909 Migraine, unspecified, not intractable, without status migrainosus: Secondary | ICD-10-CM

## 2022-05-31 DIAGNOSIS — IMO0002 Atrial fib/flutter, transient: Secondary | ICD-10-CM

## 2022-05-31 DIAGNOSIS — S0510XA Contusion of eyeball and orbital tissues, unspecified eye, initial encounter: Secondary | ICD-10-CM

## 2022-06-05 ENCOUNTER — Ambulatory Visit: Payer: Medicare Other

## 2022-06-05 ENCOUNTER — Encounter: Attending: Internal Medicine | Primary: Internal Medicine

## 2022-06-05 ENCOUNTER — Ambulatory Visit: Admit: 2022-06-05 | Payer: Medicare Other | Primary: Internal Medicine

## 2022-06-05 DIAGNOSIS — G43909 Migraine, unspecified, not intractable, without status migrainosus: Secondary | ICD-10-CM

## 2022-06-05 DIAGNOSIS — E785 Hyperlipidemia, unspecified: Secondary | ICD-10-CM

## 2022-06-05 DIAGNOSIS — I6350 Cerebral infarction due to unspecified occlusion or stenosis of unspecified cerebral artery: Secondary | ICD-10-CM

## 2022-06-05 DIAGNOSIS — G459 Transient cerebral ischemic attack, unspecified: Secondary | ICD-10-CM

## 2022-06-05 DIAGNOSIS — I8290 Acute embolism and thrombosis of unspecified vein: Secondary | ICD-10-CM

## 2022-06-05 DIAGNOSIS — I2510 Atherosclerotic heart disease of native coronary artery without angina pectoris: Secondary | ICD-10-CM

## 2022-06-05 DIAGNOSIS — Z91199 Personal history of noncompliance with medical treatment, presenting hazards to health: Secondary | ICD-10-CM

## 2022-06-05 DIAGNOSIS — K219 Gastro-esophageal reflux disease without esophagitis: Secondary | ICD-10-CM

## 2022-06-05 DIAGNOSIS — Z9189 Other specified personal risk factors, not elsewhere classified: Secondary | ICD-10-CM

## 2022-06-05 DIAGNOSIS — M1990 Unspecified osteoarthritis, unspecified site: Secondary | ICD-10-CM

## 2022-06-05 DIAGNOSIS — I2699 Other pulmonary embolism without acute cor pulmonale: Secondary | ICD-10-CM

## 2022-06-05 DIAGNOSIS — IMO0002 Atrial fib/flutter, transient: Secondary | ICD-10-CM

## 2022-06-05 DIAGNOSIS — I10 Essential (primary) hypertension: Principal | ICD-10-CM

## 2022-06-05 DIAGNOSIS — F419 Anxiety disorder, unspecified: Secondary | ICD-10-CM

## 2022-06-05 DIAGNOSIS — R251 Tremor, unspecified: Secondary | ICD-10-CM

## 2022-06-05 DIAGNOSIS — Z9109 Other allergy status, other than to drugs and biological substances: Secondary | ICD-10-CM

## 2022-06-05 DIAGNOSIS — R011 Cardiac murmur, unspecified: Secondary | ICD-10-CM

## 2022-06-05 DIAGNOSIS — D649 Anemia, unspecified: Secondary | ICD-10-CM

## 2022-06-05 DIAGNOSIS — J449 Chronic obstructive pulmonary disease, unspecified: Secondary | ICD-10-CM

## 2022-06-05 DIAGNOSIS — M3500 Sicca syndrome, unspecified: Secondary | ICD-10-CM

## 2022-06-05 DIAGNOSIS — S0510XA Contusion of eyeball and orbital tissues, unspecified eye, initial encounter: Secondary | ICD-10-CM

## 2022-06-05 DIAGNOSIS — M329 Systemic lupus erythematosus, unspecified: Secondary | ICD-10-CM

## 2022-06-05 MED ORDER — DULOXETINE HCL 30 MG PO CPEP
30 mg | Freq: Two times a day (BID) | ORAL | Status: CP
Start: 2022-06-05 — End: ?

## 2022-06-05 MED ORDER — FENTANYL CITRATE INJ 50 MCG/ML CUSTOM AMP/VIAL
INTRAVENOUS | Status: DC | PRN
Start: 2022-06-05 — End: 2022-06-05

## 2022-06-05 MED ORDER — NALOXONE HCL 0.4 MG/ML IJ SOLN
.4 mg | INTRAVENOUS | Status: CP | PRN
Start: 2022-06-05 — End: ?

## 2022-06-05 MED ORDER — SODIUM CHLORIDE 0.9 % IV SOLN
1000 mL | INTRAVENOUS | Status: CP
Start: 2022-06-05 — End: ?

## 2022-06-05 MED ORDER — ACETAMINOPHEN 500 MG PO TABS
1000 mg | Freq: Four times a day (QID) | ORAL | Status: DC
Start: 2022-06-05 — End: 2022-06-06

## 2022-06-05 MED ORDER — SUGAMMADEX SODIUM 200 MG/2ML IV SOLN
INTRAVENOUS | Status: DC | PRN
Start: 2022-06-05 — End: 2022-06-05

## 2022-06-05 MED ORDER — ONDANSETRON HCL 4 MG/2 ML IJ SOLN SH
4 mg | Freq: Four times a day (QID) | INTRAVENOUS | Status: CP | PRN
Start: 2022-06-05 — End: ?

## 2022-06-05 MED ORDER — VANCOMYCIN HCL 1000 MG IV SOLR MBP/V2B KIT JX
1000 mg | Freq: Two times a day (BID) | INTRAVENOUS | Status: CP
Start: 2022-06-05 — End: ?

## 2022-06-05 MED ORDER — ALUM & MAG HYDROX-SIMETH 1200-1200-120 MG/30ML PO SUSP UD
30 mL | ORAL | Status: CP | PRN
Start: 2022-06-05 — End: ?

## 2022-06-05 MED ORDER — LACTATED RINGERS IV SOLN
1000 mL | Freq: Once | INTRAVENOUS | Status: CP
Start: 2022-06-05 — End: ?

## 2022-06-05 MED ORDER — DIPHENHYDRAMINE HCL 25 MG PO CAPS
25 mg | Freq: Four times a day (QID) | ORAL | Status: CP | PRN
Start: 2022-06-05 — End: ?

## 2022-06-05 MED ORDER — GABAPENTIN 300 MG PO CAPS
300 mg | Freq: Three times a day (TID) | ORAL | Status: CP
Start: 2022-06-05 — End: ?

## 2022-06-05 MED ORDER — HALOPERIDOL LACTATE 5 MG/ML IJ SOLN
.5 mg | INTRAVENOUS | Status: CP | PRN
Start: 2022-06-05 — End: ?

## 2022-06-05 MED ORDER — HYDROMORPHONE HCL 2 MG/ML IJ SOLN JX
.5 mg | INTRAVENOUS | Status: CP | PRN
Start: 2022-06-05 — End: ?

## 2022-06-05 MED ORDER — FLUTICASONE PROPIONATE 50 MCG/ACT NA SUSP
1 | Freq: Every day | NASAL | Status: CP
Start: 2022-06-05 — End: ?

## 2022-06-05 MED ORDER — TRANEXAMIC ACID 1000 MG/10ML IV SOLN
INTRAVENOUS | Status: DC | PRN
Start: 2022-06-05 — End: 2022-06-05

## 2022-06-05 MED ORDER — PROMETHAZINE HCL 25 MG/ML IJ SOLN
6.25 mg | INTRAVENOUS | Status: CP | PRN
Start: 2022-06-05 — End: ?

## 2022-06-05 MED ORDER — GLUCOSE 4 G PO CHEW JX
16 g | ORAL | Status: CP | PRN
Start: 2022-06-05 — End: ?

## 2022-06-05 MED ORDER — ROCURONIUM BROMIDE 50 MG/5ML IV SOSY
INTRAVENOUS | Status: DC | PRN
Start: 2022-06-05 — End: 2022-06-05

## 2022-06-05 MED ORDER — LABETALOL HCL 5 MG/ML IV SOLN SH
INTRAVENOUS | Status: DC | PRN
Start: 2022-06-05 — End: 2022-06-05

## 2022-06-05 MED ORDER — DEXMEDETOMIDINE HCL 200 MCG/2ML IV SOLN
PERINEURAL | Status: DC | PRN
Start: 2022-06-05 — End: 2022-06-05

## 2022-06-05 MED ORDER — FERROUS SULFATE 325 (65 FE) MG PO TABS-TBEC JX
325 mg | Freq: Two times a day (BID) | ORAL | Status: CP
Start: 2022-06-05 — End: ?

## 2022-06-05 MED ORDER — HYDRALAZINE HCL 20 MG/ML IJ SOLN
5 mg | INTRAVENOUS | Status: CP | PRN
Start: 2022-06-05 — End: ?

## 2022-06-05 MED ORDER — ONDANSETRON HCL 4 MG/2 ML IJ SOLN SH
INTRAVENOUS | Status: DC | PRN
Start: 2022-06-05 — End: 2022-06-05

## 2022-06-05 MED ORDER — PROPOFOL INFUSION JX
INTRAVENOUS | Status: DC | PRN
Start: 2022-06-05 — End: 2022-06-05

## 2022-06-05 MED ORDER — OXYCODONE HCL 10 MG PO TABS
10 mg | ORAL | Status: CP | PRN
Start: 2022-06-05 — End: ?

## 2022-06-05 MED ORDER — CEFAZOLIN SODIUM 1 G IJ SOLR
2 g | Freq: Once | INTRAVENOUS | Status: CP
Start: 2022-06-05 — End: ?

## 2022-06-05 MED ORDER — HYDROMORPHONE HCL 1 MG/ML IJ SOLN
.5 mg | INTRAVENOUS | Status: CP | PRN
Start: 2022-06-05 — End: ?

## 2022-06-05 MED ORDER — VANCOMYCIN HCL 1500 MG IV SOLR MBP/V2B KIT JX
1500 mg | Freq: Once | INTRAVENOUS | Status: CP
Start: 2022-06-05 — End: ?

## 2022-06-05 MED ORDER — CELECOXIB 200 MG PO CAPS
400 mg | Freq: Once | ORAL | Status: CP
Start: 2022-06-05 — End: ?

## 2022-06-05 MED ORDER — PROMETHAZINE HCL 25 MG/ML IJ SOLN
INTRAMUSCULAR | Status: DC | PRN
Start: 2022-06-05 — End: 2022-06-05

## 2022-06-05 MED ORDER — PERIARTICULAR INJECTION 100-125 KG JX
Freq: Once | INTRA_ARTICULAR | Status: CP
Start: 2022-06-05 — End: ?

## 2022-06-05 MED ORDER — LABETALOL HCL 5 MG/ML IV SOLN SH
10 mg | INTRAVENOUS | Status: CP | PRN
Start: 2022-06-05 — End: ?

## 2022-06-05 MED ORDER — DOCUSATE SODIUM 100 MG PO CAPS
100 mg | Freq: Two times a day (BID) | ORAL | Status: CP
Start: 2022-06-05 — End: ?

## 2022-06-05 MED ORDER — VANCOMYCIN HCL 1000 MG IV SOLR CUSTOM SH
Status: DC | PRN
Start: 2022-06-05 — End: 2022-06-05

## 2022-06-05 MED ORDER — DILTIAZEM HCL ER 180 MG PO CP24
180 mg | Freq: Every day | ORAL | Status: CP
Start: 2022-06-05 — End: ?

## 2022-06-05 MED ORDER — ORAL CARBOHYDRATE FOR HYPOGLYCEMIA JX
4-8 [oz_av] | ORAL | Status: CP | PRN
Start: 2022-06-05 — End: ?

## 2022-06-05 MED ORDER — INSULIN ASPART 100 UNIT/ML IJ SOLN
0-3 [IU] | Freq: Three times a day (TID) | SUBCUTANEOUS | Status: CP
Start: 2022-06-05 — End: ?

## 2022-06-05 MED ORDER — ROPIVACAINE HCL 5 MG/ML IJ SOLN
PERINEURAL | Status: DC | PRN
Start: 2022-06-05 — End: 2022-06-05

## 2022-06-05 MED ORDER — METHOCARBAMOL 750 MG PO TABS
750 mg | Freq: Four times a day (QID) | ORAL | Status: CP
Start: 2022-06-05 — End: ?

## 2022-06-05 MED ORDER — DEXAMETHASONE SODIUM PHOSPHATE 4 MG/ML CUSTOM COMPONENT JX
INTRAVENOUS | Status: DC | PRN
Start: 2022-06-05 — End: 2022-06-05

## 2022-06-05 MED ORDER — DIPHENHYDRAMINE HCL 50 MG/ML IJ SOLN
50 mg | Freq: Four times a day (QID) | INTRAVENOUS | Status: CP | PRN
Start: 2022-06-05 — End: ?

## 2022-06-05 MED ORDER — KETOROLAC TROMETHAMINE 15 MG/ML IJ SOLN
7.5 mg | Freq: Three times a day (TID) | INTRAVENOUS | Status: CP
Start: 2022-06-05 — End: ?

## 2022-06-05 MED ORDER — PROPOFOL 10 MG/ML IV EMUL CUSTOM SH
INTRAVENOUS | Status: DC | PRN
Start: 2022-06-05 — End: 2022-06-05

## 2022-06-05 MED ORDER — LACTATED RINGERS IV SOLN
INTRAVENOUS | Status: DC | PRN
Start: 2022-06-05 — End: 2022-06-05

## 2022-06-05 MED ORDER — MIDAZOLAM HCL 2 MG/2ML IJ SOLN
Status: CP
Start: 2022-06-05 — End: ?

## 2022-06-05 MED ORDER — SODIUM CHLORIDE 0.9 % IV SOLN
1000 mL | INTRAVENOUS | Status: DC
Start: 2022-06-05 — End: 2022-06-05

## 2022-06-05 MED ORDER — HYDROMORPHONE HCL 2 MG/ML IJ SOLN JX
INTRAVENOUS | Status: DC | PRN
Start: 2022-06-05 — End: 2022-06-05

## 2022-06-05 MED ORDER — SCOPOLAMINE 1 MG/3DAYS TD PT72
1 | MEDICATED_PATCH | Freq: Once | TRANSDERMAL | Status: CP | PRN
Start: 2022-06-05 — End: ?

## 2022-06-05 MED ORDER — DEXTROSE 50 % IV SOLN
50 mL | INTRAVENOUS | Status: CP | PRN
Start: 2022-06-05 — End: ?

## 2022-06-05 MED ORDER — ENOXAPARIN SODIUM 40 MG/0.4ML IJ SOSY
40 mg | Freq: Every day | SUBCUTANEOUS | Status: CP
Start: 2022-06-05 — End: ?

## 2022-06-05 MED ORDER — CELECOXIB 200 MG PO CAPS
200 mg | Freq: Two times a day (BID) | ORAL | Status: CP
Start: 2022-06-05 — End: ?

## 2022-06-05 MED ORDER — MIDAZOLAM HCL 2 MG/2ML IJ SOLN
INTRAVENOUS | Status: DC | PRN
Start: 2022-06-05 — End: 2022-06-05

## 2022-06-05 MED ORDER — GABAPENTIN 300 MG PO CAPS
600 mg | Freq: Once | ORAL | Status: CP
Start: 2022-06-05 — End: ?

## 2022-06-05 MED ORDER — LIDOCAINE HCL (PF) 1 % IJ SOLN SH
INTRAVENOUS | Status: DC | PRN
Start: 2022-06-05 — End: 2022-06-05

## 2022-06-05 MED ORDER — BISACODYL 10 MG RE SUPP
10 mg | Freq: Once | RECTAL | Status: CP | PRN
Start: 2022-06-05 — End: ?

## 2022-06-05 MED ORDER — GABAPENTIN 300 MG PO CAPS
300 mg | Freq: Two times a day (BID) | ORAL | Status: CP
Start: 2022-06-05 — End: ?

## 2022-06-05 MED ORDER — CEFAZOLIN 2 G IV MBP
2 g | Freq: Three times a day (TID) | INTRAVENOUS | Status: CP
Start: 2022-06-05 — End: ?

## 2022-06-05 MED ORDER — POVIDONE-IODINE 5 % OP SOLN
Status: DC | PRN
Start: 2022-06-05 — End: 2022-06-05

## 2022-06-05 MED ORDER — VITAMINS/MINERALS PO TABS
1 | ORAL_TABLET | Freq: Every day | ORAL | Status: CP
Start: 2022-06-05 — End: ?

## 2022-06-05 MED ORDER — OXYCODONE HCL 5 MG PO TABS
5 mg | ORAL | Status: CP | PRN
Start: 2022-06-05 — End: ?

## 2022-06-06 DIAGNOSIS — R251 Tremor, unspecified: Secondary | ICD-10-CM

## 2022-06-06 DIAGNOSIS — I2510 Atherosclerotic heart disease of native coronary artery without angina pectoris: Secondary | ICD-10-CM

## 2022-06-06 DIAGNOSIS — G459 Transient cerebral ischemic attack, unspecified: Secondary | ICD-10-CM

## 2022-06-06 DIAGNOSIS — D649 Anemia, unspecified: Secondary | ICD-10-CM

## 2022-06-06 DIAGNOSIS — J449 Chronic obstructive pulmonary disease, unspecified: Secondary | ICD-10-CM

## 2022-06-06 DIAGNOSIS — E785 Hyperlipidemia, unspecified: Secondary | ICD-10-CM

## 2022-06-06 DIAGNOSIS — Z91199 Personal history of noncompliance with medical treatment, presenting hazards to health: Secondary | ICD-10-CM

## 2022-06-06 DIAGNOSIS — IMO0002 Atrial fib/flutter, transient: Secondary | ICD-10-CM

## 2022-06-06 DIAGNOSIS — I10 Essential (primary) hypertension: Principal | ICD-10-CM

## 2022-06-06 DIAGNOSIS — R011 Cardiac murmur, unspecified: Secondary | ICD-10-CM

## 2022-06-06 DIAGNOSIS — M1990 Unspecified osteoarthritis, unspecified site: Secondary | ICD-10-CM

## 2022-06-06 DIAGNOSIS — Z9189 Other specified personal risk factors, not elsewhere classified: Secondary | ICD-10-CM

## 2022-06-06 DIAGNOSIS — I6350 Cerebral infarction due to unspecified occlusion or stenosis of unspecified cerebral artery: Secondary | ICD-10-CM

## 2022-06-06 DIAGNOSIS — K219 Gastro-esophageal reflux disease without esophagitis: Secondary | ICD-10-CM

## 2022-06-06 DIAGNOSIS — S0510XA Contusion of eyeball and orbital tissues, unspecified eye, initial encounter: Secondary | ICD-10-CM

## 2022-06-06 DIAGNOSIS — G43909 Migraine, unspecified, not intractable, without status migrainosus: Secondary | ICD-10-CM

## 2022-06-06 DIAGNOSIS — M3500 Sicca syndrome, unspecified: Secondary | ICD-10-CM

## 2022-06-06 DIAGNOSIS — I8290 Acute embolism and thrombosis of unspecified vein: Secondary | ICD-10-CM

## 2022-06-06 DIAGNOSIS — M329 Systemic lupus erythematosus, unspecified: Secondary | ICD-10-CM

## 2022-06-06 DIAGNOSIS — I2699 Other pulmonary embolism without acute cor pulmonale: Secondary | ICD-10-CM

## 2022-06-06 DIAGNOSIS — Z9109 Other allergy status, other than to drugs and biological substances: Secondary | ICD-10-CM

## 2022-06-06 DIAGNOSIS — F419 Anxiety disorder, unspecified: Secondary | ICD-10-CM

## 2022-06-06 MED ORDER — CELECOXIB 200 MG PO CAPS
200 mg | Freq: Two times a day (BID) | ORAL | 0 refills | Status: CP
Start: 2022-06-06 — End: ?

## 2022-06-06 MED ORDER — DSS 100 MG PO CAPS
100 mg | Freq: Two times a day (BID) | ORAL | 0 refills | 30.00000 days | Status: CP
Start: 2022-06-06 — End: ?

## 2022-06-06 MED ORDER — OXYCODONE HCL 5 MG PO TABS
5 mg | ORAL | 0 refills | 30.00000 days | Status: CP | PRN
Start: 2022-06-06 — End: ?

## 2022-06-06 MED ORDER — ENOXAPARIN SODIUM 40 MG/0.4ML IJ SOSY
40 mg | Freq: Every day | SUBCUTANEOUS | 1 refills | Status: CP
Start: 2022-06-06 — End: ?

## 2022-06-07 ENCOUNTER — Encounter: Payer: Medicare Other | Primary: Internal Medicine

## 2022-06-10 MED ORDER — MORPHINE SULFATE 4 MG/ML IV SOLN CUSTOM JX
4 mg | Freq: Once | INTRAVENOUS | Status: CP
Start: 2022-06-10 — End: ?

## 2022-06-11 ENCOUNTER — Emergency Department: Admit: 2022-06-11 | Payer: Medicare Other

## 2022-06-11 ENCOUNTER — Observation Stay: Admit: 2022-06-11 | Payer: Medicare Other | Primary: Internal Medicine

## 2022-06-11 ENCOUNTER — Inpatient Hospital Stay: Admit: 2022-06-11 | Payer: Medicare Other

## 2022-06-11 MED ORDER — OXYCODONE HCL 10 MG PO TABS
10 mg | ORAL | Status: CP | PRN
Start: 2022-06-11 — End: ?

## 2022-06-11 MED ORDER — DULOXETINE HCL 30 MG PO CPEP
30 mg | Freq: Two times a day (BID) | ORAL | Status: CP
Start: 2022-06-11 — End: ?

## 2022-06-11 MED ORDER — MORPHINE SULFATE 4 MG/ML IV SOLN CUSTOM JX
4 mg | INTRAVENOUS | Status: CP | PRN
Start: 2022-06-11 — End: ?

## 2022-06-11 MED ORDER — PREGABALIN 75 MG PO CAPS
75 mg | Freq: Two times a day (BID) | ORAL | Status: CP
Start: 2022-06-11 — End: ?

## 2022-06-11 MED ORDER — MORPHINE SULFATE 4 MG/ML IV SOLN CUSTOM JX
4 mg | Freq: Once | INTRAVENOUS | Status: CP
Start: 2022-06-11 — End: ?

## 2022-06-11 MED ORDER — CYCLOBENZAPRINE HCL 5 MG PO TABS
5 mg | Freq: Three times a day (TID) | ORAL | Status: CP | PRN
Start: 2022-06-11 — End: ?

## 2022-06-11 MED ORDER — DOCUSATE SODIUM 100 MG PO CAPS
100 mg | Freq: Two times a day (BID) | ORAL | Status: CP | PRN
Start: 2022-06-11 — End: ?

## 2022-06-11 MED ORDER — TRAZODONE HCL 50 MG PO TABS
50 mg | Freq: Every evening | ORAL | Status: CP | PRN
Start: 2022-06-11 — End: ?

## 2022-06-11 MED ORDER — CELECOXIB 200 MG PO CAPS
200 mg | Freq: Once | ORAL | Status: CP
Start: 2022-06-11 — End: ?

## 2022-06-11 MED ORDER — DILTIAZEM HCL ER 180 MG PO CP24
180 mg | Freq: Every day | ORAL | Status: CP
Start: 2022-06-11 — End: ?

## 2022-06-11 MED ORDER — ONDANSETRON HCL 4 MG/2 ML IJ SOLN SH
4 mg | Freq: Four times a day (QID) | INTRAVENOUS | Status: CP | PRN
Start: 2022-06-11 — End: ?

## 2022-06-11 MED ORDER — ENOXAPARIN SODIUM 40 MG/0.4ML IJ SOSY
40 mg | Freq: Every day | SUBCUTANEOUS | Status: DC
Start: 2022-06-11 — End: 2022-06-11

## 2022-06-11 MED ORDER — DEXTROSE 50 % IV SOLN
50 mL | INTRAVENOUS | Status: CP | PRN
Start: 2022-06-11 — End: ?

## 2022-06-11 MED ORDER — ONDANSETRON 4 MG PO TBDP
4 mg | Freq: Four times a day (QID) | ORAL | Status: CP | PRN
Start: 2022-06-11 — End: ?

## 2022-06-11 MED ORDER — ORAL CARBOHYDRATE FOR HYPOGLYCEMIA JX
4-8 [oz_av] | ORAL | Status: CP | PRN
Start: 2022-06-11 — End: ?

## 2022-06-11 MED ORDER — GLUCOSE 4 G PO CHEW JX
16 g | ORAL | Status: CP | PRN
Start: 2022-06-11 — End: ?

## 2022-06-11 MED ORDER — MORPHINE SULFATE 4 MG/ML IV SOLN CUSTOM JX
4 mg | INTRAVENOUS | Status: DC | PRN
Start: 2022-06-11 — End: 2022-06-12

## 2022-06-11 MED ORDER — POLYETHYLENE GLYCOL 3350 17 G PO PACK
17 g | Freq: Every day | ORAL | Status: CP | PRN
Start: 2022-06-11 — End: ?

## 2022-06-11 MED ORDER — ALUM & MAG HYDROX-SIMETH 1200-1200-120 MG/30ML PO SUSP UD
30 mL | Freq: Four times a day (QID) | ORAL | Status: CP | PRN
Start: 2022-06-11 — End: ?

## 2022-06-11 MED ORDER — DIPHENHYDRAMINE HCL 25 MG PO CAPS
25 mg | Freq: Four times a day (QID) | ORAL | Status: CP | PRN
Start: 2022-06-11 — End: ?

## 2022-06-11 MED ORDER — HEPARIN SODIUM (PORCINE) 5000 UNIT/ML IJ SOLN
5000 [IU] | Freq: Three times a day (TID) | SUBCUTANEOUS | Status: DC
Start: 2022-06-11 — End: 2022-06-11

## 2022-06-11 MED ORDER — CYCLOBENZAPRINE HCL 5 MG PO TABS
10 mg | Freq: Every evening | ORAL | Status: DC
Start: 2022-06-11 — End: 2022-06-12

## 2022-06-11 MED ORDER — LABETALOL HCL 5 MG/ML IV SOLN SH
10 mg | INTRAVENOUS | Status: CP | PRN
Start: 2022-06-11 — End: ?

## 2022-06-11 MED ORDER — CYCLOBENZAPRINE HCL 5 MG PO TABS
10 mg | Freq: Three times a day (TID) | ORAL | Status: CP
Start: 2022-06-11 — End: ?

## 2022-06-11 MED ORDER — OXYCODONE HCL 5 MG PO TABS
5 mg | ORAL | Status: DC | PRN
Start: 2022-06-11 — End: 2022-06-12

## 2022-06-12 MED ORDER — SODIUM CHLORIDE 0.9% FOR FLUSHES
20-180 mL | INTRAVENOUS | Status: CP | PRN
Start: 2022-06-12 — End: ?

## 2022-06-12 MED ORDER — ENOXAPARIN SODIUM 40 MG/0.4ML IJ SOSY
40 mg | Freq: Every day | SUBCUTANEOUS | Status: CP
Start: 2022-06-12 — End: ?

## 2022-06-12 MED ORDER — IOHEXOL 350 MG/ML IV SOLN SH
100 mL | Freq: Once | INTRAVENOUS | Status: CP
Start: 2022-06-12 — End: ?

## 2022-06-13 ENCOUNTER — Encounter: Payer: Medicare Other | Attending: Family Medicine | Primary: Internal Medicine

## 2022-06-13 MED ORDER — DEXAMETHASONE SODIUM PHOSPHATE 4 MG/ML CUSTOM COMPONENT JX
4 mg | Freq: Four times a day (QID) | INTRAVENOUS | Status: CP
Start: 2022-06-13 — End: ?

## 2022-06-14 MED ORDER — VALSARTAN 160 MG PO TABS
320 mg | Freq: Every day | ORAL | Status: CP
Start: 2022-06-14 — End: ?

## 2022-06-14 MED ORDER — PREDNISONE 20 MG PO TABS
20 mg | Freq: Every day | ORAL | Status: CP
Start: 2022-06-14 — End: ?

## 2022-06-14 MED ORDER — FUROSEMIDE 20 MG PO TABS
40 mg | Freq: Two times a day (BID) | ORAL | Status: CP
Start: 2022-06-14 — End: ?

## 2022-06-15 MED ORDER — DILTIAZEM HCL ER 180 MG PO CP24
180 mg | Freq: Every day | ORAL | Status: CP
Start: 2022-06-15 — End: ?

## 2022-06-15 MED ORDER — CLONAZEPAM 0.5 MG PO TABS
.5 mg | Freq: Two times a day (BID) | ORAL | Status: CP
Start: 2022-06-15 — End: ?

## 2022-06-15 MED ORDER — PREDNISONE 10 MG PO TABS
10 mg | Freq: Every day | ORAL | Status: CP
Start: 2022-06-15 — End: ?

## 2022-06-15 MED ORDER — HYDROMORPHONE HCL 1 MG/ML IJ SOLN
1 mg | INTRAVENOUS | Status: CP | PRN
Start: 2022-06-15 — End: ?

## 2022-06-15 MED ORDER — KETOROLAC TROMETHAMINE 15 MG/ML IJ SOLN
15 mg | Freq: Once | INTRAVENOUS | Status: CP
Start: 2022-06-15 — End: ?

## 2022-06-15 MED ORDER — DULOXETINE HCL 60 MG PO CPEP
60 mg | Freq: Two times a day (BID) | ORAL | Status: CP
Start: 2022-06-15 — End: ?

## 2022-06-19 MED ORDER — OXYCODONE HCL 10 MG PO TABS
10 mg | ORAL | 0 refills | 30.00000 days | Status: CP | PRN
Start: 2022-06-19 — End: ?

## 2022-06-19 MED ORDER — NUTRITION ADULT - ORAL SUPPLEMENTS JX
Freq: Two times a day (BID) | ORAL | Status: DC
Start: 2022-06-19 — End: 2022-06-20

## 2022-06-19 MED ORDER — OXYCODONE HCL 5 MG PO TABS
5 mg | ORAL | 0 refills | 30.00000 days | Status: CP | PRN
Start: 2022-06-19 — End: 2022-06-19

## 2022-06-20 DIAGNOSIS — M7989 Other specified soft tissue disorders: Principal | ICD-10-CM

## 2022-06-20 DIAGNOSIS — Z09 Encounter for follow-up examination after completed treatment for conditions other than malignant neoplasm: Secondary | ICD-10-CM

## 2022-06-20 MED ORDER — OXYCODONE HCL 10 MG PO TABS
10 mg | ORAL | 0 refills | 30.00000 days | Status: CP | PRN
Start: 2022-06-20 — End: ?

## 2022-06-21 ENCOUNTER — Ambulatory Visit: Payer: Medicare Other | Attending: Family Medicine | Primary: Internal Medicine

## 2022-06-21 DIAGNOSIS — M1990 Unspecified osteoarthritis, unspecified site: Secondary | ICD-10-CM

## 2022-06-21 DIAGNOSIS — F419 Anxiety disorder, unspecified: Secondary | ICD-10-CM

## 2022-06-21 DIAGNOSIS — I8290 Acute embolism and thrombosis of unspecified vein: Secondary | ICD-10-CM

## 2022-06-21 DIAGNOSIS — R011 Cardiac murmur, unspecified: Secondary | ICD-10-CM

## 2022-06-21 DIAGNOSIS — I6350 Cerebral infarction due to unspecified occlusion or stenosis of unspecified cerebral artery: Secondary | ICD-10-CM

## 2022-06-21 DIAGNOSIS — K219 Gastro-esophageal reflux disease without esophagitis: Secondary | ICD-10-CM

## 2022-06-21 DIAGNOSIS — S0510XA Contusion of eyeball and orbital tissues, unspecified eye, initial encounter: Secondary | ICD-10-CM

## 2022-06-21 DIAGNOSIS — I2510 Atherosclerotic heart disease of native coronary artery without angina pectoris: Secondary | ICD-10-CM

## 2022-06-21 DIAGNOSIS — M7989 Other specified soft tissue disorders: Secondary | ICD-10-CM

## 2022-06-21 DIAGNOSIS — G459 Transient cerebral ischemic attack, unspecified: Secondary | ICD-10-CM

## 2022-06-21 DIAGNOSIS — Z09 Encounter for follow-up examination after completed treatment for conditions other than malignant neoplasm: Secondary | ICD-10-CM

## 2022-06-21 DIAGNOSIS — Z91199 Personal history of noncompliance with medical treatment, presenting hazards to health: Secondary | ICD-10-CM

## 2022-06-21 DIAGNOSIS — J449 Chronic obstructive pulmonary disease, unspecified: Secondary | ICD-10-CM

## 2022-06-21 DIAGNOSIS — E785 Hyperlipidemia, unspecified: Secondary | ICD-10-CM

## 2022-06-21 DIAGNOSIS — D649 Anemia, unspecified: Secondary | ICD-10-CM

## 2022-06-21 DIAGNOSIS — Z9109 Other allergy status, other than to drugs and biological substances: Secondary | ICD-10-CM

## 2022-06-21 DIAGNOSIS — M329 Systemic lupus erythematosus, unspecified: Secondary | ICD-10-CM

## 2022-06-21 DIAGNOSIS — I2699 Other pulmonary embolism without acute cor pulmonale: Secondary | ICD-10-CM

## 2022-06-21 DIAGNOSIS — R251 Tremor, unspecified: Secondary | ICD-10-CM

## 2022-06-21 DIAGNOSIS — I10 Essential (primary) hypertension: Principal | ICD-10-CM

## 2022-06-21 DIAGNOSIS — G43909 Migraine, unspecified, not intractable, without status migrainosus: Secondary | ICD-10-CM

## 2022-06-21 DIAGNOSIS — Z96651 Presence of right artificial knee joint: Secondary | ICD-10-CM

## 2022-06-21 DIAGNOSIS — M3500 Sicca syndrome, unspecified: Secondary | ICD-10-CM

## 2022-06-21 DIAGNOSIS — IMO0002 Atrial fib/flutter, transient: Secondary | ICD-10-CM

## 2022-06-21 DIAGNOSIS — Z6839 Body mass index (BMI) 39.0-39.9, adult: Principal | ICD-10-CM

## 2022-06-21 DIAGNOSIS — F329 Major depressive disorder, single episode, unspecified: Secondary | ICD-10-CM

## 2022-06-21 DIAGNOSIS — Z9189 Other specified personal risk factors, not elsewhere classified: Secondary | ICD-10-CM

## 2022-06-24 ENCOUNTER — Ambulatory Visit
Admit: 2022-06-24 | Discharge: 2022-06-25 | Payer: Medicare Other | Attending: Sports Medicine | Primary: Internal Medicine

## 2022-06-24 ENCOUNTER — Inpatient Hospital Stay: Admit: 2022-06-24 | Discharge: 2022-06-25 | Payer: Medicare Other | Primary: Internal Medicine

## 2022-06-24 DIAGNOSIS — M3500 Sicca syndrome, unspecified: Secondary | ICD-10-CM

## 2022-06-24 DIAGNOSIS — Z9189 Other specified personal risk factors, not elsewhere classified: Secondary | ICD-10-CM

## 2022-06-24 DIAGNOSIS — Z91199 Personal history of noncompliance with medical treatment, presenting hazards to health: Secondary | ICD-10-CM

## 2022-06-24 DIAGNOSIS — I10 Essential (primary) hypertension: Principal | ICD-10-CM

## 2022-06-24 DIAGNOSIS — M1711 Unilateral primary osteoarthritis, right knee: Principal | ICD-10-CM

## 2022-06-24 DIAGNOSIS — I6350 Cerebral infarction due to unspecified occlusion or stenosis of unspecified cerebral artery: Secondary | ICD-10-CM

## 2022-06-24 DIAGNOSIS — E785 Hyperlipidemia, unspecified: Secondary | ICD-10-CM

## 2022-06-24 DIAGNOSIS — Z9109 Other allergy status, other than to drugs and biological substances: Secondary | ICD-10-CM

## 2022-06-24 DIAGNOSIS — F419 Anxiety disorder, unspecified: Secondary | ICD-10-CM

## 2022-06-24 DIAGNOSIS — IMO0002 Atrial fib/flutter, transient: Secondary | ICD-10-CM

## 2022-06-24 DIAGNOSIS — I8290 Acute embolism and thrombosis of unspecified vein: Secondary | ICD-10-CM

## 2022-06-24 DIAGNOSIS — G43909 Migraine, unspecified, not intractable, without status migrainosus: Secondary | ICD-10-CM

## 2022-06-24 DIAGNOSIS — S0510XA Contusion of eyeball and orbital tissues, unspecified eye, initial encounter: Secondary | ICD-10-CM

## 2022-06-24 DIAGNOSIS — G459 Transient cerebral ischemic attack, unspecified: Secondary | ICD-10-CM

## 2022-06-24 DIAGNOSIS — J449 Chronic obstructive pulmonary disease, unspecified: Secondary | ICD-10-CM

## 2022-06-24 DIAGNOSIS — R251 Tremor, unspecified: Secondary | ICD-10-CM

## 2022-06-24 DIAGNOSIS — M329 Systemic lupus erythematosus, unspecified: Secondary | ICD-10-CM

## 2022-06-24 DIAGNOSIS — D649 Anemia, unspecified: Secondary | ICD-10-CM

## 2022-06-24 DIAGNOSIS — M1990 Unspecified osteoarthritis, unspecified site: Secondary | ICD-10-CM

## 2022-06-24 DIAGNOSIS — R011 Cardiac murmur, unspecified: Secondary | ICD-10-CM

## 2022-06-24 DIAGNOSIS — K219 Gastro-esophageal reflux disease without esophagitis: Secondary | ICD-10-CM

## 2022-06-24 DIAGNOSIS — I2699 Other pulmonary embolism without acute cor pulmonale: Secondary | ICD-10-CM

## 2022-06-24 DIAGNOSIS — I2510 Atherosclerotic heart disease of native coronary artery without angina pectoris: Secondary | ICD-10-CM

## 2022-06-28 DIAGNOSIS — M797 Fibromyalgia: Principal | ICD-10-CM

## 2022-06-28 MED ORDER — PREGABALIN 75 MG PO CAPS
75 mg | Freq: Two times a day (BID) | ORAL | 4 refills
Start: 2022-06-28 — End: ?

## 2022-07-02 DIAGNOSIS — M797 Fibromyalgia: Principal | ICD-10-CM

## 2022-07-02 MED ORDER — PREGABALIN 75 MG PO CAPS
75 mg | Freq: Two times a day (BID) | ORAL | 2 refills | 30.00000 days | Status: CP
Start: 2022-07-02 — End: ?

## 2022-07-02 MED ORDER — PREGABALIN 75 MG PO CAPS: Start: 2022-07-02 — End: ?

## 2022-07-11 DIAGNOSIS — F329 Major depressive disorder, single episode, unspecified: Principal | ICD-10-CM

## 2022-07-11 MED ORDER — DULOXETINE HCL 30 MG PO CPEP
30 mg | Freq: Two times a day (BID) | ORAL | 2 refills | Status: CP
Start: 2022-07-11 — End: ?

## 2022-07-30 ENCOUNTER — Ambulatory Visit
Admit: 2022-07-30 | Discharge: 2022-07-31 | Payer: Medicare Other | Attending: Registered Nurse | Primary: Internal Medicine

## 2022-07-30 DIAGNOSIS — M1990 Unspecified osteoarthritis, unspecified site: Secondary | ICD-10-CM

## 2022-07-30 DIAGNOSIS — M170 Bilateral primary osteoarthritis of knee: Secondary | ICD-10-CM

## 2022-07-30 DIAGNOSIS — Z96651 Presence of right artificial knee joint: Principal | ICD-10-CM

## 2022-07-30 DIAGNOSIS — I10 Essential (primary) hypertension: Principal | ICD-10-CM

## 2022-07-30 DIAGNOSIS — I2510 Atherosclerotic heart disease of native coronary artery without angina pectoris: Secondary | ICD-10-CM

## 2022-07-30 DIAGNOSIS — I8290 Acute embolism and thrombosis of unspecified vein: Secondary | ICD-10-CM

## 2022-07-30 DIAGNOSIS — D649 Anemia, unspecified: Secondary | ICD-10-CM

## 2022-07-30 DIAGNOSIS — M1712 Unilateral primary osteoarthritis, left knee: Secondary | ICD-10-CM

## 2022-07-30 DIAGNOSIS — G43909 Migraine, unspecified, not intractable, without status migrainosus: Secondary | ICD-10-CM

## 2022-07-30 DIAGNOSIS — I2699 Other pulmonary embolism without acute cor pulmonale: Secondary | ICD-10-CM

## 2022-07-30 DIAGNOSIS — G894 Chronic pain syndrome: Secondary | ICD-10-CM

## 2022-07-30 DIAGNOSIS — S0510XA Contusion of eyeball and orbital tissues, unspecified eye, initial encounter: Secondary | ICD-10-CM

## 2022-07-30 DIAGNOSIS — R251 Tremor, unspecified: Secondary | ICD-10-CM

## 2022-07-30 DIAGNOSIS — E785 Hyperlipidemia, unspecified: Secondary | ICD-10-CM

## 2022-07-30 DIAGNOSIS — I6350 Cerebral infarction due to unspecified occlusion or stenosis of unspecified cerebral artery: Secondary | ICD-10-CM

## 2022-07-30 DIAGNOSIS — M329 Systemic lupus erythematosus, unspecified: Secondary | ICD-10-CM

## 2022-07-30 DIAGNOSIS — Z91199 Personal history of noncompliance with medical treatment, presenting hazards to health: Secondary | ICD-10-CM

## 2022-07-30 DIAGNOSIS — G459 Transient cerebral ischemic attack, unspecified: Secondary | ICD-10-CM

## 2022-07-30 DIAGNOSIS — Z9109 Other allergy status, other than to drugs and biological substances: Secondary | ICD-10-CM

## 2022-07-30 DIAGNOSIS — Z9189 Other specified personal risk factors, not elsewhere classified: Secondary | ICD-10-CM

## 2022-07-30 DIAGNOSIS — M3500 Sicca syndrome, unspecified: Secondary | ICD-10-CM

## 2022-07-30 DIAGNOSIS — R011 Cardiac murmur, unspecified: Secondary | ICD-10-CM

## 2022-07-30 DIAGNOSIS — IMO0002 Atrial fib/flutter, transient: Secondary | ICD-10-CM

## 2022-07-30 DIAGNOSIS — K219 Gastro-esophageal reflux disease without esophagitis: Secondary | ICD-10-CM

## 2022-07-30 DIAGNOSIS — F419 Anxiety disorder, unspecified: Secondary | ICD-10-CM

## 2022-07-30 DIAGNOSIS — J449 Chronic obstructive pulmonary disease, unspecified: Secondary | ICD-10-CM

## 2022-07-30 MED ORDER — ACETAMINOPHEN 500 MG PO TABS
1000 mg | Freq: Once | ORAL
Start: 2022-07-30 — End: ?

## 2022-07-30 MED ORDER — PERIARTICULAR INJECTION 100-125 KG JX
Freq: Once | INTRA_ARTICULAR
Start: 2022-07-30 — End: ?

## 2022-07-30 MED ORDER — CELECOXIB 200 MG PO CAPS
400 mg | Freq: Once | ORAL
Start: 2022-07-30 — End: ?

## 2022-07-30 MED ORDER — VANCOMYCIN HCL 1000 MG IV SOLR MBP/V2B KIT JX
1000 mg | Freq: Once | INTRAVENOUS
Start: 2022-07-30 — End: ?

## 2022-07-30 MED ORDER — CEFAZOLIN SODIUM 1 G IJ SOLR
2 g | Freq: Once | INTRAVENOUS
Start: 2022-07-30 — End: ?

## 2022-07-30 MED ORDER — SODIUM CHLORIDE 0.9 % IV SOLN
1000 mL | INTRAVENOUS
Start: 2022-07-30 — End: ?

## 2022-07-30 MED ORDER — GABAPENTIN 300 MG PO CAPS
600 mg | Freq: Once | ORAL
Start: 2022-07-30 — End: ?

## 2022-08-08 ENCOUNTER — Inpatient Hospital Stay: Admit: 2022-08-08 | Discharge: 2022-08-09 | Payer: Medicare Other | Primary: Internal Medicine

## 2022-08-08 DIAGNOSIS — Z96651 Presence of right artificial knee joint: Principal | ICD-10-CM

## 2022-08-14 ENCOUNTER — Encounter: Primary: Internal Medicine

## 2022-08-21 ENCOUNTER — Ambulatory Visit: Payer: Medicare Other | Attending: Internal Medicine | Primary: Internal Medicine

## 2022-08-21 DIAGNOSIS — E559 Vitamin D deficiency, unspecified: Secondary | ICD-10-CM

## 2022-08-21 DIAGNOSIS — M1990 Unspecified osteoarthritis, unspecified site: Secondary | ICD-10-CM

## 2022-08-21 DIAGNOSIS — G43909 Migraine, unspecified, not intractable, without status migrainosus: Secondary | ICD-10-CM

## 2022-08-21 DIAGNOSIS — E785 Hyperlipidemia, unspecified: Secondary | ICD-10-CM

## 2022-08-21 DIAGNOSIS — J984 Other disorders of lung: Secondary | ICD-10-CM

## 2022-08-21 DIAGNOSIS — M170 Bilateral primary osteoarthritis of knee: Secondary | ICD-10-CM

## 2022-08-21 DIAGNOSIS — S0510XA Contusion of eyeball and orbital tissues, unspecified eye, initial encounter: Secondary | ICD-10-CM

## 2022-08-21 DIAGNOSIS — I2699 Other pulmonary embolism without acute cor pulmonale: Secondary | ICD-10-CM

## 2022-08-21 DIAGNOSIS — I6350 Cerebral infarction due to unspecified occlusion or stenosis of unspecified cerebral artery: Secondary | ICD-10-CM

## 2022-08-21 DIAGNOSIS — Z79899 Other long term (current) drug therapy: Secondary | ICD-10-CM

## 2022-08-21 DIAGNOSIS — M329 Systemic lupus erythematosus, unspecified: Principal | ICD-10-CM

## 2022-08-21 DIAGNOSIS — I8290 Acute embolism and thrombosis of unspecified vein: Secondary | ICD-10-CM

## 2022-08-21 DIAGNOSIS — I10 Essential (primary) hypertension: Principal | ICD-10-CM

## 2022-08-21 DIAGNOSIS — F419 Anxiety disorder, unspecified: Secondary | ICD-10-CM

## 2022-08-21 DIAGNOSIS — IMO0002 Atrial fib/flutter, transient: Secondary | ICD-10-CM

## 2022-08-21 DIAGNOSIS — Z91199 Personal history of noncompliance with medical treatment, presenting hazards to health: Secondary | ICD-10-CM

## 2022-08-21 DIAGNOSIS — Z9189 Other specified personal risk factors, not elsewhere classified: Secondary | ICD-10-CM

## 2022-08-21 DIAGNOSIS — I2510 Atherosclerotic heart disease of native coronary artery without angina pectoris: Secondary | ICD-10-CM

## 2022-08-21 DIAGNOSIS — M3219 Other organ or system involvement in systemic lupus erythematosus: Secondary | ICD-10-CM

## 2022-08-21 DIAGNOSIS — M797 Fibromyalgia: Secondary | ICD-10-CM

## 2022-08-21 DIAGNOSIS — G459 Transient cerebral ischemic attack, unspecified: Secondary | ICD-10-CM

## 2022-08-21 DIAGNOSIS — M3500 Sicca syndrome, unspecified: Secondary | ICD-10-CM

## 2022-08-21 DIAGNOSIS — R251 Tremor, unspecified: Secondary | ICD-10-CM

## 2022-08-21 DIAGNOSIS — J449 Chronic obstructive pulmonary disease, unspecified: Secondary | ICD-10-CM

## 2022-08-21 DIAGNOSIS — R011 Cardiac murmur, unspecified: Secondary | ICD-10-CM

## 2022-08-21 DIAGNOSIS — K219 Gastro-esophageal reflux disease without esophagitis: Secondary | ICD-10-CM

## 2022-08-21 DIAGNOSIS — D649 Anemia, unspecified: Secondary | ICD-10-CM

## 2022-08-21 DIAGNOSIS — Z9109 Other allergy status, other than to drugs and biological substances: Secondary | ICD-10-CM

## 2022-08-21 MED ORDER — PREGABALIN 100 MG PO CAPS
100 mg | Freq: Two times a day (BID) | ORAL | 2 refills | 28.00 days | Status: CP
Start: 2022-08-21 — End: ?

## 2022-08-21 MED ORDER — PREDNISONE 5 MG PO TABS
5 mg | ORAL | 1 refills | 30.00 days | Status: CP
Start: 2022-08-21 — End: ?

## 2022-08-23 ENCOUNTER — Encounter: Admit: 2022-08-23 | Payer: Medicare Other | Primary: Internal Medicine

## 2022-08-23 ENCOUNTER — Inpatient Hospital Stay: Admit: 2022-08-23 | Discharge: 2022-08-24 | Payer: Medicare Other | Primary: Internal Medicine

## 2022-08-23 ENCOUNTER — Inpatient Hospital Stay: Admit: 2022-08-23 | Discharge: 2022-08-29 | Payer: Medicare Other | Primary: Internal Medicine

## 2022-08-29 ENCOUNTER — Encounter: Attending: Acute Care | Primary: Internal Medicine

## 2022-08-30 ENCOUNTER — Inpatient Hospital Stay: Admit: 2022-08-30 | Discharge: 2022-08-31 | Payer: Medicare Other | Primary: Internal Medicine

## 2022-08-30 DIAGNOSIS — Z9109 Other allergy status, other than to drugs and biological substances: Secondary | ICD-10-CM

## 2022-08-30 DIAGNOSIS — S0510XA Contusion of eyeball and orbital tissues, unspecified eye, initial encounter: Secondary | ICD-10-CM

## 2022-08-30 DIAGNOSIS — G459 Transient cerebral ischemic attack, unspecified: Secondary | ICD-10-CM

## 2022-08-30 DIAGNOSIS — I10 Essential (primary) hypertension: Principal | ICD-10-CM

## 2022-08-30 DIAGNOSIS — I8290 Acute embolism and thrombosis of unspecified vein: Secondary | ICD-10-CM

## 2022-08-30 DIAGNOSIS — J449 Chronic obstructive pulmonary disease, unspecified: Secondary | ICD-10-CM

## 2022-08-30 DIAGNOSIS — Z9189 Other specified personal risk factors, not elsewhere classified: Secondary | ICD-10-CM

## 2022-08-30 DIAGNOSIS — I2510 Atherosclerotic heart disease of native coronary artery without angina pectoris: Secondary | ICD-10-CM

## 2022-08-30 DIAGNOSIS — IMO0002 Atrial fib/flutter, transient: Secondary | ICD-10-CM

## 2022-08-30 DIAGNOSIS — K219 Gastro-esophageal reflux disease without esophagitis: Secondary | ICD-10-CM

## 2022-08-30 DIAGNOSIS — F32A Depression: Secondary | ICD-10-CM

## 2022-08-30 DIAGNOSIS — G43909 Migraine, unspecified, not intractable, without status migrainosus: Secondary | ICD-10-CM

## 2022-08-30 DIAGNOSIS — M3500 Sicca syndrome, unspecified: Secondary | ICD-10-CM

## 2022-08-30 DIAGNOSIS — E785 Hyperlipidemia, unspecified: Secondary | ICD-10-CM

## 2022-08-30 DIAGNOSIS — N3281 Overactive bladder: Secondary | ICD-10-CM

## 2022-08-30 DIAGNOSIS — M329 Systemic lupus erythematosus, unspecified: Secondary | ICD-10-CM

## 2022-08-30 DIAGNOSIS — M1990 Unspecified osteoarthritis, unspecified site: Secondary | ICD-10-CM

## 2022-08-30 DIAGNOSIS — R251 Tremor, unspecified: Secondary | ICD-10-CM

## 2022-08-30 DIAGNOSIS — R011 Cardiac murmur, unspecified: Secondary | ICD-10-CM

## 2022-08-30 DIAGNOSIS — I2699 Other pulmonary embolism without acute cor pulmonale: Secondary | ICD-10-CM

## 2022-08-30 DIAGNOSIS — M8580 Other specified disorders of bone density and structure, unspecified site: Secondary | ICD-10-CM

## 2022-08-30 DIAGNOSIS — I6350 Cerebral infarction due to unspecified occlusion or stenosis of unspecified cerebral artery: Secondary | ICD-10-CM

## 2022-08-30 DIAGNOSIS — F419 Anxiety disorder, unspecified: Secondary | ICD-10-CM

## 2022-08-30 DIAGNOSIS — D649 Anemia, unspecified: Secondary | ICD-10-CM

## 2022-08-30 DIAGNOSIS — Z91199 Personal history of noncompliance with medical treatment, presenting hazards to health: Secondary | ICD-10-CM

## 2022-09-02 ENCOUNTER — Inpatient Hospital Stay: Admit: 2022-09-02 | Discharge: 2022-09-03 | Payer: Medicare Other | Primary: Internal Medicine

## 2022-09-02 DIAGNOSIS — D72819 Decreased white blood cell count, unspecified: Principal | ICD-10-CM

## 2022-09-03 ENCOUNTER — Ambulatory Visit
Admit: 2022-09-03 | Discharge: 2022-09-04 | Payer: Medicare Other | Attending: Registered Nurse | Primary: Internal Medicine

## 2022-09-03 DIAGNOSIS — M1712 Unilateral primary osteoarthritis, left knee: Principal | ICD-10-CM

## 2022-09-03 DIAGNOSIS — R011 Cardiac murmur, unspecified: Secondary | ICD-10-CM

## 2022-09-03 DIAGNOSIS — M329 Systemic lupus erythematosus, unspecified: Secondary | ICD-10-CM

## 2022-09-03 DIAGNOSIS — N3281 Overactive bladder: Secondary | ICD-10-CM

## 2022-09-03 DIAGNOSIS — M3500 Sicca syndrome, unspecified: Secondary | ICD-10-CM

## 2022-09-03 DIAGNOSIS — M1990 Unspecified osteoarthritis, unspecified site: Secondary | ICD-10-CM

## 2022-09-03 DIAGNOSIS — I6350 Cerebral infarction due to unspecified occlusion or stenosis of unspecified cerebral artery: Secondary | ICD-10-CM

## 2022-09-03 DIAGNOSIS — S0510XA Contusion of eyeball and orbital tissues, unspecified eye, initial encounter: Secondary | ICD-10-CM

## 2022-09-03 DIAGNOSIS — J449 Chronic obstructive pulmonary disease, unspecified: Secondary | ICD-10-CM

## 2022-09-03 DIAGNOSIS — I10 Essential (primary) hypertension: Principal | ICD-10-CM

## 2022-09-03 DIAGNOSIS — Z9109 Other allergy status, other than to drugs and biological substances: Secondary | ICD-10-CM

## 2022-09-03 DIAGNOSIS — R251 Tremor, unspecified: Secondary | ICD-10-CM

## 2022-09-03 DIAGNOSIS — I2510 Atherosclerotic heart disease of native coronary artery without angina pectoris: Secondary | ICD-10-CM

## 2022-09-03 DIAGNOSIS — D649 Anemia, unspecified: Secondary | ICD-10-CM

## 2022-09-03 DIAGNOSIS — I2699 Other pulmonary embolism without acute cor pulmonale: Secondary | ICD-10-CM

## 2022-09-03 DIAGNOSIS — M8580 Other specified disorders of bone density and structure, unspecified site: Secondary | ICD-10-CM

## 2022-09-03 DIAGNOSIS — F419 Anxiety disorder, unspecified: Secondary | ICD-10-CM

## 2022-09-03 DIAGNOSIS — IMO0002 Atrial fib/flutter, transient: Secondary | ICD-10-CM

## 2022-09-03 DIAGNOSIS — Z91199 Personal history of noncompliance with medical treatment, presenting hazards to health: Secondary | ICD-10-CM

## 2022-09-03 DIAGNOSIS — I8290 Acute embolism and thrombosis of unspecified vein: Secondary | ICD-10-CM

## 2022-09-03 DIAGNOSIS — G459 Transient cerebral ischemic attack, unspecified: Secondary | ICD-10-CM

## 2022-09-03 DIAGNOSIS — F32A Depression: Secondary | ICD-10-CM

## 2022-09-03 DIAGNOSIS — Z9189 Other specified personal risk factors, not elsewhere classified: Secondary | ICD-10-CM

## 2022-09-03 DIAGNOSIS — G43909 Migraine, unspecified, not intractable, without status migrainosus: Secondary | ICD-10-CM

## 2022-09-03 DIAGNOSIS — K219 Gastro-esophageal reflux disease without esophagitis: Secondary | ICD-10-CM

## 2022-09-03 DIAGNOSIS — E785 Hyperlipidemia, unspecified: Secondary | ICD-10-CM

## 2022-09-09 ENCOUNTER — Telehealth
Admit: 2022-09-09 | Discharge: 2022-09-10 | Payer: Medicare Other | Attending: Medical Oncology | Primary: Internal Medicine

## 2022-09-09 DIAGNOSIS — I2510 Atherosclerotic heart disease of native coronary artery without angina pectoris: Secondary | ICD-10-CM

## 2022-09-09 DIAGNOSIS — F32A Depression: Secondary | ICD-10-CM

## 2022-09-09 DIAGNOSIS — J449 Chronic obstructive pulmonary disease, unspecified: Secondary | ICD-10-CM

## 2022-09-09 DIAGNOSIS — Z9109 Other allergy status, other than to drugs and biological substances: Secondary | ICD-10-CM

## 2022-09-09 DIAGNOSIS — IMO0002 Atrial fib/flutter, transient: Secondary | ICD-10-CM

## 2022-09-09 DIAGNOSIS — F419 Anxiety disorder, unspecified: Secondary | ICD-10-CM

## 2022-09-09 DIAGNOSIS — M8580 Other specified disorders of bone density and structure, unspecified site: Secondary | ICD-10-CM

## 2022-09-09 DIAGNOSIS — G459 Transient cerebral ischemic attack, unspecified: Secondary | ICD-10-CM

## 2022-09-09 DIAGNOSIS — G43909 Migraine, unspecified, not intractable, without status migrainosus: Secondary | ICD-10-CM

## 2022-09-09 DIAGNOSIS — I10 Essential (primary) hypertension: Principal | ICD-10-CM

## 2022-09-09 DIAGNOSIS — S0510XA Contusion of eyeball and orbital tissues, unspecified eye, initial encounter: Secondary | ICD-10-CM

## 2022-09-09 DIAGNOSIS — M3500 Sicca syndrome, unspecified: Secondary | ICD-10-CM

## 2022-09-09 DIAGNOSIS — R011 Cardiac murmur, unspecified: Secondary | ICD-10-CM

## 2022-09-09 DIAGNOSIS — D72819 Decreased white blood cell count, unspecified: Principal | ICD-10-CM

## 2022-09-09 DIAGNOSIS — K219 Gastro-esophageal reflux disease without esophagitis: Secondary | ICD-10-CM

## 2022-09-09 DIAGNOSIS — M1990 Unspecified osteoarthritis, unspecified site: Secondary | ICD-10-CM

## 2022-09-09 DIAGNOSIS — I6350 Cerebral infarction due to unspecified occlusion or stenosis of unspecified cerebral artery: Secondary | ICD-10-CM

## 2022-09-09 DIAGNOSIS — I8290 Acute embolism and thrombosis of unspecified vein: Secondary | ICD-10-CM

## 2022-09-09 DIAGNOSIS — D649 Anemia, unspecified: Secondary | ICD-10-CM

## 2022-09-09 DIAGNOSIS — R251 Tremor, unspecified: Secondary | ICD-10-CM

## 2022-09-09 DIAGNOSIS — Z9189 Other specified personal risk factors, not elsewhere classified: Secondary | ICD-10-CM

## 2022-09-09 DIAGNOSIS — I2699 Other pulmonary embolism without acute cor pulmonale: Secondary | ICD-10-CM

## 2022-09-09 DIAGNOSIS — E785 Hyperlipidemia, unspecified: Secondary | ICD-10-CM

## 2022-09-09 DIAGNOSIS — M329 Systemic lupus erythematosus, unspecified: Secondary | ICD-10-CM

## 2022-09-09 DIAGNOSIS — N3281 Overactive bladder: Secondary | ICD-10-CM

## 2022-09-09 DIAGNOSIS — Z91199 Personal history of noncompliance with medical treatment, presenting hazards to health: Secondary | ICD-10-CM

## 2022-09-16 ENCOUNTER — Encounter: Primary: Internal Medicine

## 2022-09-20 DIAGNOSIS — D72819 Decreased white blood cell count, unspecified: Principal | ICD-10-CM

## 2022-09-23 ENCOUNTER — Inpatient Hospital Stay: Attending: Medical Oncology | Primary: Internal Medicine

## 2022-09-26 ENCOUNTER — Ambulatory Visit: Admit: 2022-09-26 | Payer: Medicare Other | Primary: Internal Medicine

## 2022-09-26 ENCOUNTER — Ambulatory Visit: Payer: Medicare Other

## 2022-09-26 DIAGNOSIS — Z91199 Personal history of noncompliance with medical treatment, presenting hazards to health: Secondary | ICD-10-CM

## 2022-09-26 DIAGNOSIS — I8290 Acute embolism and thrombosis of unspecified vein: Secondary | ICD-10-CM

## 2022-09-26 DIAGNOSIS — N3281 Overactive bladder: Secondary | ICD-10-CM

## 2022-09-26 DIAGNOSIS — IMO0002 Atrial fib/flutter, transient: Secondary | ICD-10-CM

## 2022-09-26 DIAGNOSIS — M3500 Sicca syndrome, unspecified: Secondary | ICD-10-CM

## 2022-09-26 DIAGNOSIS — D649 Anemia, unspecified: Secondary | ICD-10-CM

## 2022-09-26 DIAGNOSIS — G43909 Migraine, unspecified, not intractable, without status migrainosus: Secondary | ICD-10-CM

## 2022-09-26 DIAGNOSIS — Z9109 Other allergy status, other than to drugs and biological substances: Secondary | ICD-10-CM

## 2022-09-26 DIAGNOSIS — R251 Tremor, unspecified: Secondary | ICD-10-CM

## 2022-09-26 DIAGNOSIS — K219 Gastro-esophageal reflux disease without esophagitis: Secondary | ICD-10-CM

## 2022-09-26 DIAGNOSIS — F419 Anxiety disorder, unspecified: Secondary | ICD-10-CM

## 2022-09-26 DIAGNOSIS — I10 Essential (primary) hypertension: Principal | ICD-10-CM

## 2022-09-26 DIAGNOSIS — M329 Systemic lupus erythematosus, unspecified: Secondary | ICD-10-CM

## 2022-09-26 DIAGNOSIS — I2699 Other pulmonary embolism without acute cor pulmonale: Secondary | ICD-10-CM

## 2022-09-26 DIAGNOSIS — I6350 Cerebral infarction due to unspecified occlusion or stenosis of unspecified cerebral artery: Secondary | ICD-10-CM

## 2022-09-26 DIAGNOSIS — J449 Chronic obstructive pulmonary disease, unspecified: Secondary | ICD-10-CM

## 2022-09-26 DIAGNOSIS — F32A Depression: Secondary | ICD-10-CM

## 2022-09-26 DIAGNOSIS — G459 Transient cerebral ischemic attack, unspecified: Secondary | ICD-10-CM

## 2022-09-26 DIAGNOSIS — E785 Hyperlipidemia, unspecified: Secondary | ICD-10-CM

## 2022-09-26 DIAGNOSIS — Z9189 Other specified personal risk factors, not elsewhere classified: Secondary | ICD-10-CM

## 2022-09-26 DIAGNOSIS — M8580 Other specified disorders of bone density and structure, unspecified site: Secondary | ICD-10-CM

## 2022-09-26 DIAGNOSIS — S0510XA Contusion of eyeball and orbital tissues, unspecified eye, initial encounter: Secondary | ICD-10-CM

## 2022-09-26 DIAGNOSIS — M1990 Unspecified osteoarthritis, unspecified site: Secondary | ICD-10-CM

## 2022-09-26 DIAGNOSIS — I2510 Atherosclerotic heart disease of native coronary artery without angina pectoris: Secondary | ICD-10-CM

## 2022-09-26 DIAGNOSIS — R011 Cardiac murmur, unspecified: Secondary | ICD-10-CM

## 2022-09-26 MED ORDER — FERROUS SULFATE 325 (65 FE) MG PO TABS-TBEC JX
325 mg | Freq: Two times a day (BID) | ORAL | Status: CP
Start: 2022-09-26 — End: ?

## 2022-09-26 MED ORDER — DIPHENHYDRAMINE HCL 25 MG PO CAPS
25 mg | Freq: Four times a day (QID) | ORAL | Status: CP | PRN
Start: 2022-09-26 — End: ?

## 2022-09-26 MED ORDER — PROMETHAZINE HCL 25 MG/ML IJ SOLN
6.25 mg | INTRAVENOUS | Status: CP | PRN
Start: 2022-09-26 — End: ?

## 2022-09-26 MED ORDER — OXYCODONE HCL 5 MG PO TABS
10 mg | Freq: Once | ORAL | Status: CP | PRN
Start: 2022-09-26 — End: ?

## 2022-09-26 MED ORDER — FENTANYL CITRATE INJ 50 MCG/ML CUSTOM AMP/VIAL
INTRAVENOUS | Status: DC | PRN
Start: 2022-09-26 — End: 2022-09-26

## 2022-09-26 MED ORDER — GABAPENTIN 300 MG PO CAPS
600 mg | Freq: Once | ORAL | Status: CP
Start: 2022-09-26 — End: ?

## 2022-09-26 MED ORDER — LACTATED RINGERS IV SOLN
Freq: Once | INTRAVENOUS | Status: CP
Start: 2022-09-26 — End: ?

## 2022-09-26 MED ORDER — DULOXETINE HCL 30 MG PO CPEP
30 mg | Freq: Two times a day (BID) | ORAL | Status: CP
Start: 2022-09-26 — End: ?

## 2022-09-26 MED ORDER — DILTIAZEM HCL ER 180 MG PO CP24
180 mg | Freq: Every day | ORAL | Status: CP
Start: 2022-09-26 — End: ?

## 2022-09-26 MED ORDER — VANCOMYCIN HCL 1000 MG IV SOLR MBP/V2B KIT JX
1000 mg | Freq: Once | INTRAVENOUS | Status: CP
Start: 2022-09-26 — End: ?

## 2022-09-26 MED ORDER — DIPHENHYDRAMINE HCL 50 MG/ML IJ SOLN
12.5 mg | INTRAVENOUS | Status: CP | PRN
Start: 2022-09-26 — End: ?

## 2022-09-26 MED ORDER — CELECOXIB 200 MG PO CAPS
200 mg | Freq: Two times a day (BID) | ORAL | 0 refills | Status: CP
Start: 2022-09-26 — End: ?

## 2022-09-26 MED ORDER — ALUM & MAG HYDROX-SIMETH 1200-1200-120 MG/30ML PO SUSP UD
30 mL | ORAL | Status: CP | PRN
Start: 2022-09-26 — End: ?

## 2022-09-26 MED ORDER — CYCLOBENZAPRINE HCL 10 MG PO TABS
10 mg | Freq: Every evening | ORAL | Status: CP
Start: 2022-09-26 — End: ?

## 2022-09-26 MED ORDER — VANCOMYCIN HCL 1000 MG IV SOLR CUSTOM SH
Status: DC | PRN
Start: 2022-09-26 — End: 2022-09-26

## 2022-09-26 MED ORDER — ENOXAPARIN SODIUM 40 MG/0.4ML IJ SOSY
40 mg | Freq: Every day | SUBCUTANEOUS | Status: CP
Start: 2022-09-26 — End: ?

## 2022-09-26 MED ORDER — HYDROMORPHONE HCL 2 MG/ML IJ SOLN JX
INTRAVENOUS | Status: DC | PRN
Start: 2022-09-26 — End: 2022-09-26

## 2022-09-26 MED ORDER — TRANEXAMIC ACID 1000 MG/10ML IV SOLN
INTRAVENOUS | Status: DC | PRN
Start: 2022-09-26 — End: 2022-09-26

## 2022-09-26 MED ORDER — VITAMINS/MINERALS PO TABS
1 | ORAL_TABLET | Freq: Every day | ORAL | Status: CP
Start: 2022-09-26 — End: ?

## 2022-09-26 MED ORDER — LABETALOL HCL 5 MG/ML IV SOLN SH
INTRAVENOUS | Status: DC | PRN
Start: 2022-09-26 — End: 2022-09-26

## 2022-09-26 MED ORDER — SUGAMMADEX SODIUM 200 MG/2ML IV SOLN
INTRAVENOUS | Status: DC | PRN
Start: 2022-09-26 — End: 2022-09-26

## 2022-09-26 MED ORDER — PROPOFOL 10 MG/ML IV EMUL CUSTOM SH
INTRAVENOUS | Status: DC | PRN
Start: 2022-09-26 — End: 2022-09-26

## 2022-09-26 MED ORDER — VALSARTAN-HYDROCHLOROTHIAZIDE 320-25 MG PO TABS
1 | ORAL_TABLET | Freq: Every day | ORAL | Status: DC
Start: 2022-09-26 — End: 2022-09-27

## 2022-09-26 MED ORDER — DEXAMETHASONE SODIUM PHOSPHATE 4 MG/ML CUSTOM COMPONENT JX
INTRAVENOUS | Status: DC | PRN
Start: 2022-09-26 — End: 2022-09-26

## 2022-09-26 MED ORDER — HYDRALAZINE HCL 20 MG/ML IJ SOLN
INTRAVENOUS | Status: DC | PRN
Start: 2022-09-26 — End: 2022-09-26

## 2022-09-26 MED ORDER — HYDROMORPHONE HCL 1 MG/ML IJ SOLN
.5 mg | INTRAVENOUS | Status: CP | PRN
Start: 2022-09-26 — End: ?

## 2022-09-26 MED ORDER — DOCUSATE SODIUM 100 MG PO CAPS
100 mg | Freq: Two times a day (BID) | ORAL | Status: CP
Start: 2022-09-26 — End: ?

## 2022-09-26 MED ORDER — CELECOXIB 200 MG PO CAPS
400 mg | Freq: Once | ORAL | Status: DC
Start: 2022-09-26 — End: 2022-09-26

## 2022-09-26 MED ORDER — MIDAZOLAM HCL 2 MG/2ML IJ SOLN
INTRAVENOUS | Status: DC | PRN
Start: 2022-09-26 — End: 2022-09-26

## 2022-09-26 MED ORDER — ROCURONIUM BROMIDE 100 MG/10ML IV SOLN
INTRAVENOUS | Status: DC | PRN
Start: 2022-09-26 — End: 2022-09-26

## 2022-09-26 MED ORDER — CEFAZOLIN SODIUM 1 G IJ SOLR
2 g | Freq: Once | INTRAVENOUS | Status: CP
Start: 2022-09-26 — End: ?

## 2022-09-26 MED ORDER — ACETAMINOPHEN 500 MG PO TABS
1000 mg | Freq: Once | ORAL | Status: DC
Start: 2022-09-26 — End: 2022-09-26

## 2022-09-26 MED ORDER — SODIUM CHLORIDE 0.9 % IV SOLN
1000 mL | INTRAVENOUS | Status: CP
Start: 2022-09-26 — End: ?

## 2022-09-26 MED ORDER — GABAPENTIN 300 MG PO CAPS
300 mg | Freq: Three times a day (TID) | ORAL | Status: CP
Start: 2022-09-26 — End: ?

## 2022-09-26 MED ORDER — PREGABALIN 50 MG PO CAPS
100 mg | Freq: Two times a day (BID) | ORAL | Status: CP
Start: 2022-09-26 — End: ?

## 2022-09-26 MED ORDER — ONDANSETRON HCL 4 MG/2 ML IJ SOLN SH
4 mg | Freq: Four times a day (QID) | INTRAVENOUS | Status: CP | PRN
Start: 2022-09-26 — End: ?

## 2022-09-26 MED ORDER — KETOROLAC TROMETHAMINE 15 MG/ML IJ SOLN
INTRAVENOUS | Status: DC | PRN
Start: 2022-09-26 — End: 2022-09-26

## 2022-09-26 MED ORDER — ACETAMINOPHEN 500 MG PO TABS
1000 mg | Freq: Four times a day (QID) | ORAL | Status: DC
Start: 2022-09-26 — End: 2022-09-27

## 2022-09-26 MED ORDER — ONDANSETRON HCL 4 MG/2 ML IJ SOLN SH
4 mg | INTRAVENOUS | Status: CP | PRN
Start: 2022-09-26 — End: ?

## 2022-09-26 MED ORDER — HYDROMORPHONE HCL 1 MG/ML IJ SOLN
.5 mg | INTRAVENOUS | Status: DC | PRN
Start: 2022-09-26 — End: 2022-09-27

## 2022-09-26 MED ORDER — LIDOCAINE HCL (PF) 1 % IJ SOLN SH
INTRAVENOUS | Status: DC | PRN
Start: 2022-09-26 — End: 2022-09-26

## 2022-09-26 MED ORDER — ROPIVACAINE HCL 5 MG/ML IJ SOLN
PERINEURAL | Status: DC | PRN
Start: 2022-09-26 — End: 2022-09-26

## 2022-09-26 MED ORDER — DEXMEDETOMIDINE HCL 200 MCG/2ML IV SOLN
PERINEURAL | Status: DC | PRN
Start: 2022-09-26 — End: 2022-09-26

## 2022-09-26 MED ORDER — GABAPENTIN 300 MG PO CAPS
300 mg | Freq: Two times a day (BID) | ORAL | Status: CP
Start: 2022-09-26 — End: ?

## 2022-09-26 MED ORDER — ONDANSETRON HCL 4 MG/2 ML IJ SOLN SH
INTRAVENOUS | Status: DC | PRN
Start: 2022-09-26 — End: 2022-09-26

## 2022-09-26 MED ORDER — HYDROXYCHLOROQUINE SULFATE 200 MG PO TABS
200 mg | Freq: Two times a day (BID) | ORAL | Status: CP
Start: 2022-09-26 — End: ?

## 2022-09-26 MED ORDER — HYDROMORPHONE HCL 1 MG/ML IJ SOLN
1 mg | INTRAVENOUS | Status: CP | PRN
Start: 2022-09-26 — End: ?

## 2022-09-26 MED ORDER — CELECOXIB 200 MG PO CAPS
400 mg | Freq: Once | ORAL | Status: CP
Start: 2022-09-26 — End: ?

## 2022-09-26 MED ORDER — SCOPOLAMINE 1 MG/3DAYS TD PT72
1 | MEDICATED_PATCH | Freq: Once | TRANSDERMAL | Status: CP
Start: 2022-09-26 — End: ?

## 2022-09-26 MED ORDER — FLUTICASONE PROPIONATE 50 MCG/ACT NA SUSP
1 | NASAL | Status: CP | PRN
Start: 2022-09-26 — End: ?

## 2022-09-26 MED ORDER — NALOXONE HCL 2 MG/2ML IJ SOSY
.4 mg | INTRAVENOUS | Status: CP | PRN
Start: 2022-09-26 — End: ?

## 2022-09-26 MED ORDER — CEFAZOLIN 2 G IV MBP
2 g | Freq: Three times a day (TID) | INTRAVENOUS | Status: CP
Start: 2022-09-26 — End: ?

## 2022-09-26 MED ORDER — PERIARTICULAR INJECTION 100-125 KG JX
Freq: Once | INTRA_ARTICULAR | Status: CP
Start: 2022-09-26 — End: ?

## 2022-09-26 MED ORDER — OXYCODONE HCL 5 MG PO TABS
5 mg | ORAL | Status: CP | PRN
Start: 2022-09-26 — End: ?

## 2022-09-26 MED ORDER — GABAPENTIN 300 MG PO CAPS
600 mg | Freq: Once | ORAL | Status: DC
Start: 2022-09-26 — End: 2022-09-26

## 2022-09-26 MED ORDER — FENTANYL CITRATE INJ 50 MCG/ML CUSTOM AMP/VIAL
50 ug | INTRAVENOUS | Status: CP | PRN
Start: 2022-09-26 — End: ?

## 2022-09-26 MED ORDER — DIPHENHYDRAMINE HCL 50 MG/ML IJ SOLN
50 mg | Freq: Four times a day (QID) | INTRAVENOUS | Status: CP | PRN
Start: 2022-09-26 — End: ?

## 2022-09-26 MED ORDER — PREDNISONE 5 MG PO TABS
5 mg | ORAL | Status: CP
Start: 2022-09-26 — End: ?

## 2022-09-26 MED ORDER — KETOROLAC TROMETHAMINE 15 MG/ML IJ SOLN
7.5 mg | Freq: Three times a day (TID) | INTRAVENOUS | 0 refills | Status: CP
Start: 2022-09-26 — End: ?

## 2022-09-26 MED ORDER — POVIDONE-IODINE 5 % OP SOLN
Status: DC | PRN
Start: 2022-09-26 — End: 2022-09-26

## 2022-09-26 MED ORDER — LOSARTAN-HYDROCHLOROTHIAZIDE 100-25 MG COMBO MED JX
Freq: Every day | ORAL | Status: CP
Start: 2022-09-26 — End: ?

## 2022-09-27 ENCOUNTER — Encounter: Payer: Medicare Other | Attending: Sports Medicine | Primary: Internal Medicine

## 2022-09-27 MED ORDER — OXYCODONE HCL 10 MG PO TABS
10 mg | ORAL | Status: CP | PRN
Start: 2022-09-27 — End: ?

## 2022-09-27 MED ORDER — HYDROMORPHONE HCL 1 MG/ML IJ SOLN
1 mg | INTRAVENOUS | Status: CP | PRN
Start: 2022-09-27 — End: ?

## 2022-09-27 MED ORDER — OXYCODONE HCL 10 MG PO TABS
10 mg | Freq: Once | ORAL | Status: CP
Start: 2022-09-27 — End: ?

## 2022-09-27 MED ORDER — OXYCODONE HCL 10 MG PO TABS
10 mg | ORAL | Status: DC | PRN
Start: 2022-09-27 — End: 2022-09-27

## 2022-09-28 MED ORDER — OXYCODONE HCL 10 MG PO TABS
10 mg | ORAL | Status: CP
Start: 2022-09-28 — End: ?

## 2022-09-28 MED ORDER — POLYETHYLENE GLYCOL 3350 17 G PO PACK
17 g | Freq: Every day | ORAL | Status: CP
Start: 2022-09-28 — End: ?

## 2022-09-30 MED ORDER — DEXTROMETHORPHAN-GUAIFENESIN 5 ML PO SYRP UD SH
10 mL | ORAL | Status: CP | PRN
Start: 2022-09-30 — End: ?

## 2022-09-30 MED ORDER — PREDNISONE 5 MG PO TABS
5 mg | Freq: Every day | ORAL | Status: CP
Start: 2022-09-30 — End: ?

## 2022-09-30 MED ORDER — FUROSEMIDE 20 MG/2ML IJ SOLN UD JX
20 mg | Freq: Once | INTRAVENOUS | Status: CP
Start: 2022-09-30 — End: ?

## 2022-10-01 DIAGNOSIS — J449 Chronic obstructive pulmonary disease, unspecified: Secondary | ICD-10-CM

## 2022-10-01 DIAGNOSIS — M329 Systemic lupus erythematosus, unspecified: Secondary | ICD-10-CM

## 2022-10-01 DIAGNOSIS — M3500 Sicca syndrome, unspecified: Secondary | ICD-10-CM

## 2022-10-01 DIAGNOSIS — R011 Cardiac murmur, unspecified: Secondary | ICD-10-CM

## 2022-10-01 DIAGNOSIS — Z9109 Other allergy status, other than to drugs and biological substances: Secondary | ICD-10-CM

## 2022-10-01 DIAGNOSIS — M1990 Unspecified osteoarthritis, unspecified site: Secondary | ICD-10-CM

## 2022-10-01 DIAGNOSIS — F32A Depression: Secondary | ICD-10-CM

## 2022-10-01 DIAGNOSIS — G43909 Migraine, unspecified, not intractable, without status migrainosus: Secondary | ICD-10-CM

## 2022-10-01 DIAGNOSIS — Z91199 Personal history of noncompliance with medical treatment, presenting hazards to health: Secondary | ICD-10-CM

## 2022-10-01 DIAGNOSIS — IMO0002 Atrial fib/flutter, transient: Secondary | ICD-10-CM

## 2022-10-01 DIAGNOSIS — R251 Tremor, unspecified: Secondary | ICD-10-CM

## 2022-10-01 DIAGNOSIS — E785 Hyperlipidemia, unspecified: Secondary | ICD-10-CM

## 2022-10-01 DIAGNOSIS — I8290 Acute embolism and thrombosis of unspecified vein: Secondary | ICD-10-CM

## 2022-10-01 DIAGNOSIS — M8580 Other specified disorders of bone density and structure, unspecified site: Secondary | ICD-10-CM

## 2022-10-01 DIAGNOSIS — G459 Transient cerebral ischemic attack, unspecified: Secondary | ICD-10-CM

## 2022-10-01 DIAGNOSIS — K219 Gastro-esophageal reflux disease without esophagitis: Secondary | ICD-10-CM

## 2022-10-01 DIAGNOSIS — F419 Anxiety disorder, unspecified: Secondary | ICD-10-CM

## 2022-10-01 DIAGNOSIS — I2699 Other pulmonary embolism without acute cor pulmonale: Secondary | ICD-10-CM

## 2022-10-01 DIAGNOSIS — D649 Anemia, unspecified: Secondary | ICD-10-CM

## 2022-10-01 DIAGNOSIS — S0510XA Contusion of eyeball and orbital tissues, unspecified eye, initial encounter: Secondary | ICD-10-CM

## 2022-10-01 DIAGNOSIS — I6350 Cerebral infarction due to unspecified occlusion or stenosis of unspecified cerebral artery: Secondary | ICD-10-CM

## 2022-10-01 DIAGNOSIS — Z9189 Other specified personal risk factors, not elsewhere classified: Secondary | ICD-10-CM

## 2022-10-01 DIAGNOSIS — N3281 Overactive bladder: Secondary | ICD-10-CM

## 2022-10-01 DIAGNOSIS — I2510 Atherosclerotic heart disease of native coronary artery without angina pectoris: Secondary | ICD-10-CM

## 2022-10-01 DIAGNOSIS — I10 Essential (primary) hypertension: Principal | ICD-10-CM

## 2022-10-01 MED ORDER — FERROUS SULFATE 325 (65 FE) MG PO TABS
325 mg | Freq: Two times a day (BID) | ORAL | 0 refills | Status: CP
Start: 2022-10-01 — End: ?

## 2022-10-01 MED ORDER — GABAPENTIN 300 MG PO CAPS
300 mg | Freq: Three times a day (TID) | ORAL | 0 refills | Status: CP
Start: 2022-10-01 — End: ?

## 2022-10-01 MED ORDER — MAGNESIUM CITRATE 1.745 GM/30ML PO SOLN
296 mL | Freq: Once | ORAL | Status: CP
Start: 2022-10-01 — End: ?

## 2022-10-01 MED ORDER — VITAMINS/MINERALS PO TABS
1 | ORAL_TABLET | Freq: Every day | ORAL | 0 refills | 30.00000 days | Status: CP
Start: 2022-10-01 — End: ?

## 2022-10-01 MED ORDER — ASPIRIN 81 MG PO TBEC
81 mg | Freq: Two times a day (BID) | ORAL | 0 refills | Status: CP
Start: 2022-10-01 — End: ?

## 2022-10-01 MED ORDER — CELECOXIB 200 MG PO CAPS
200 mg | Freq: Two times a day (BID) | ORAL | 0 refills | Status: CP
Start: 2022-10-01 — End: ?

## 2022-10-01 MED ORDER — DSS 100 MG PO CAPS
100 mg | Freq: Two times a day (BID) | ORAL | 0 refills | Status: CP
Start: 2022-10-01 — End: ?

## 2022-10-01 MED ORDER — FUROSEMIDE 20 MG/2ML IJ SOLN UD JX
20 mg | Freq: Once | INTRAVENOUS | Status: DC
Start: 2022-10-01 — End: 2022-10-01

## 2022-10-01 MED ORDER — PREDNISONE 5 MG PO TABS
5 mg | Freq: Every day | ORAL | 1 refills | 7.00 days | Status: CP
Start: 2022-10-01 — End: ?

## 2022-10-01 MED ORDER — OXYCODONE HCL 10 MG PO TABS
10 mg | ORAL | 0 refills | 30.00000 days | Status: CP
Start: 2022-10-01 — End: ?

## 2022-10-04 MED ORDER — ENOXAPARIN SODIUM 40 MG/0.4ML IJ SOSY
40 mg | Freq: Every day | SUBCUTANEOUS | 0 refills | Status: CP
Start: 2022-10-04 — End: ?

## 2022-10-08 ENCOUNTER — Ambulatory Visit: Payer: Medicare Other | Attending: Pulmonary Disease | Primary: Internal Medicine

## 2022-10-08 DIAGNOSIS — M8580 Other specified disorders of bone density and structure, unspecified site: Secondary | ICD-10-CM

## 2022-10-08 DIAGNOSIS — J849 Interstitial pulmonary disease, unspecified: Secondary | ICD-10-CM

## 2022-10-08 DIAGNOSIS — D649 Anemia, unspecified: Secondary | ICD-10-CM

## 2022-10-08 DIAGNOSIS — N3281 Overactive bladder: Secondary | ICD-10-CM

## 2022-10-08 DIAGNOSIS — F32A Depression: Secondary | ICD-10-CM

## 2022-10-08 DIAGNOSIS — IMO0002 Atrial fib/flutter, transient: Secondary | ICD-10-CM

## 2022-10-08 DIAGNOSIS — I2510 Atherosclerotic heart disease of native coronary artery without angina pectoris: Secondary | ICD-10-CM

## 2022-10-08 DIAGNOSIS — I2699 Other pulmonary embolism without acute cor pulmonale: Secondary | ICD-10-CM

## 2022-10-08 DIAGNOSIS — Z9189 Other specified personal risk factors, not elsewhere classified: Secondary | ICD-10-CM

## 2022-10-08 DIAGNOSIS — E785 Hyperlipidemia, unspecified: Secondary | ICD-10-CM

## 2022-10-08 DIAGNOSIS — S0510XA Contusion of eyeball and orbital tissues, unspecified eye, initial encounter: Secondary | ICD-10-CM

## 2022-10-08 DIAGNOSIS — I6350 Cerebral infarction due to unspecified occlusion or stenosis of unspecified cerebral artery: Secondary | ICD-10-CM

## 2022-10-08 DIAGNOSIS — J449 Chronic obstructive pulmonary disease, unspecified: Secondary | ICD-10-CM

## 2022-10-08 DIAGNOSIS — M3500 Sicca syndrome, unspecified: Secondary | ICD-10-CM

## 2022-10-08 DIAGNOSIS — Z9109 Other allergy status, other than to drugs and biological substances: Secondary | ICD-10-CM

## 2022-10-08 DIAGNOSIS — I10 Essential (primary) hypertension: Principal | ICD-10-CM

## 2022-10-08 DIAGNOSIS — Z91199 Personal history of noncompliance with medical treatment, presenting hazards to health: Secondary | ICD-10-CM

## 2022-10-08 DIAGNOSIS — M329 Systemic lupus erythematosus, unspecified: Secondary | ICD-10-CM

## 2022-10-08 DIAGNOSIS — R942 Abnormal results of pulmonary function studies: Principal | ICD-10-CM

## 2022-10-08 DIAGNOSIS — G43909 Migraine, unspecified, not intractable, without status migrainosus: Secondary | ICD-10-CM

## 2022-10-08 DIAGNOSIS — R251 Tremor, unspecified: Secondary | ICD-10-CM

## 2022-10-08 DIAGNOSIS — G459 Transient cerebral ischemic attack, unspecified: Secondary | ICD-10-CM

## 2022-10-08 DIAGNOSIS — R011 Cardiac murmur, unspecified: Secondary | ICD-10-CM

## 2022-10-08 DIAGNOSIS — M1990 Unspecified osteoarthritis, unspecified site: Secondary | ICD-10-CM

## 2022-10-08 DIAGNOSIS — K219 Gastro-esophageal reflux disease without esophagitis: Secondary | ICD-10-CM

## 2022-10-08 DIAGNOSIS — I8290 Acute embolism and thrombosis of unspecified vein: Secondary | ICD-10-CM

## 2022-10-08 DIAGNOSIS — F419 Anxiety disorder, unspecified: Secondary | ICD-10-CM

## 2022-10-14 ENCOUNTER — Inpatient Hospital Stay: Admit: 2022-10-14 | Discharge: 2022-10-15 | Payer: Medicare Other | Primary: Internal Medicine

## 2022-10-15 ENCOUNTER — Ambulatory Visit: Payer: Medicare Other | Attending: Physician Assistant | Primary: Internal Medicine

## 2022-10-15 DIAGNOSIS — R251 Tremor, unspecified: Secondary | ICD-10-CM

## 2022-10-15 DIAGNOSIS — N3281 Overactive bladder: Secondary | ICD-10-CM

## 2022-10-15 DIAGNOSIS — M3501 Sicca syndrome with keratoconjunctivitis: Secondary | ICD-10-CM

## 2022-10-15 DIAGNOSIS — M1990 Unspecified osteoarthritis, unspecified site: Secondary | ICD-10-CM

## 2022-10-15 DIAGNOSIS — Z9109 Other allergy status, other than to drugs and biological substances: Secondary | ICD-10-CM

## 2022-10-15 DIAGNOSIS — M3500 Sicca syndrome, unspecified: Secondary | ICD-10-CM

## 2022-10-15 DIAGNOSIS — Z9189 Other specified personal risk factors, not elsewhere classified: Secondary | ICD-10-CM

## 2022-10-15 DIAGNOSIS — I2510 Atherosclerotic heart disease of native coronary artery without angina pectoris: Secondary | ICD-10-CM

## 2022-10-15 DIAGNOSIS — I10 Essential (primary) hypertension: Principal | ICD-10-CM

## 2022-10-15 DIAGNOSIS — M170 Bilateral primary osteoarthritis of knee: Secondary | ICD-10-CM

## 2022-10-15 DIAGNOSIS — G894 Chronic pain syndrome: Secondary | ICD-10-CM

## 2022-10-15 DIAGNOSIS — F419 Anxiety disorder, unspecified: Secondary | ICD-10-CM

## 2022-10-15 DIAGNOSIS — IMO0002 Atrial fib/flutter, transient: Secondary | ICD-10-CM

## 2022-10-15 DIAGNOSIS — I201 Angina pectoris with documented spasm: Secondary | ICD-10-CM

## 2022-10-15 DIAGNOSIS — M329 Systemic lupus erythematosus, unspecified: Secondary | ICD-10-CM

## 2022-10-15 DIAGNOSIS — R058 Cough due to ACE inhibitor: Secondary | ICD-10-CM

## 2022-10-15 DIAGNOSIS — K219 Gastro-esophageal reflux disease without esophagitis: Secondary | ICD-10-CM

## 2022-10-15 DIAGNOSIS — S0510XA Contusion of eyeball and orbital tissues, unspecified eye, initial encounter: Secondary | ICD-10-CM

## 2022-10-15 DIAGNOSIS — G459 Transient cerebral ischemic attack, unspecified: Secondary | ICD-10-CM

## 2022-10-15 DIAGNOSIS — G43909 Migraine, unspecified, not intractable, without status migrainosus: Secondary | ICD-10-CM

## 2022-10-15 DIAGNOSIS — R739 Hyperglycemia, unspecified: Secondary | ICD-10-CM

## 2022-10-15 DIAGNOSIS — F32A Depression: Secondary | ICD-10-CM

## 2022-10-15 DIAGNOSIS — M8580 Other specified disorders of bone density and structure, unspecified site: Secondary | ICD-10-CM

## 2022-10-15 DIAGNOSIS — T464X5A Adverse effect of angiotensin-converting-enzyme inhibitors, initial encounter: Secondary | ICD-10-CM

## 2022-10-15 DIAGNOSIS — J449 Chronic obstructive pulmonary disease, unspecified: Secondary | ICD-10-CM

## 2022-10-15 DIAGNOSIS — M797 Fibromyalgia: Secondary | ICD-10-CM

## 2022-10-15 DIAGNOSIS — E785 Hyperlipidemia, unspecified: Secondary | ICD-10-CM

## 2022-10-15 DIAGNOSIS — I6350 Cerebral infarction due to unspecified occlusion or stenosis of unspecified cerebral artery: Secondary | ICD-10-CM

## 2022-10-15 DIAGNOSIS — Z6838 Body mass index (BMI) 38.0-38.9, adult: Principal | ICD-10-CM

## 2022-10-15 DIAGNOSIS — D649 Anemia, unspecified: Secondary | ICD-10-CM

## 2022-10-15 DIAGNOSIS — F329 Major depressive disorder, single episode, unspecified: Secondary | ICD-10-CM

## 2022-10-15 DIAGNOSIS — I2699 Other pulmonary embolism without acute cor pulmonale: Secondary | ICD-10-CM

## 2022-10-15 DIAGNOSIS — I8290 Acute embolism and thrombosis of unspecified vein: Secondary | ICD-10-CM

## 2022-10-15 DIAGNOSIS — R011 Cardiac murmur, unspecified: Secondary | ICD-10-CM

## 2022-10-15 DIAGNOSIS — Z91199 Personal history of noncompliance with medical treatment, presenting hazards to health: Secondary | ICD-10-CM

## 2022-10-15 MED ORDER — MOUNJARO 2.5 MG/0.5ML SC SOPN
2.5 mg | SUBCUTANEOUS | 3 refills | 28.00000 days | Status: CP
Start: 2022-10-15 — End: ?

## 2022-10-17 ENCOUNTER — Inpatient Hospital Stay: Admit: 2022-10-17 | Discharge: 2022-10-18 | Payer: Medicare Other | Primary: Internal Medicine

## 2022-10-17 DIAGNOSIS — R942 Abnormal results of pulmonary function studies: Principal | ICD-10-CM

## 2022-10-17 DIAGNOSIS — J849 Interstitial pulmonary disease, unspecified: Secondary | ICD-10-CM

## 2022-10-18 ENCOUNTER — Encounter: Payer: Medicare Other | Attending: Registered Nurse | Primary: Internal Medicine

## 2022-10-18 ENCOUNTER — Ambulatory Visit: Payer: Medicare Other | Attending: Registered Nurse | Primary: Internal Medicine

## 2022-10-18 ENCOUNTER — Encounter: Payer: Medicare Other | Primary: Internal Medicine

## 2022-10-18 ENCOUNTER — Encounter: Payer: Medicare Other | Attending: Sports Medicine | Primary: Internal Medicine

## 2022-10-18 DIAGNOSIS — Z9109 Other allergy status, other than to drugs and biological substances: Secondary | ICD-10-CM

## 2022-10-18 DIAGNOSIS — I10 Essential (primary) hypertension: Principal | ICD-10-CM

## 2022-10-18 DIAGNOSIS — Z96651 Presence of right artificial knee joint: Secondary | ICD-10-CM

## 2022-10-18 DIAGNOSIS — R251 Tremor, unspecified: Secondary | ICD-10-CM

## 2022-10-18 DIAGNOSIS — IMO0002 Atrial fib/flutter, transient: Secondary | ICD-10-CM

## 2022-10-18 DIAGNOSIS — I8290 Acute embolism and thrombosis of unspecified vein: Secondary | ICD-10-CM

## 2022-10-18 DIAGNOSIS — M3500 Sicca syndrome, unspecified: Secondary | ICD-10-CM

## 2022-10-18 DIAGNOSIS — K219 Gastro-esophageal reflux disease without esophagitis: Secondary | ICD-10-CM

## 2022-10-18 DIAGNOSIS — I2699 Other pulmonary embolism without acute cor pulmonale: Secondary | ICD-10-CM

## 2022-10-18 DIAGNOSIS — M329 Systemic lupus erythematosus, unspecified: Secondary | ICD-10-CM

## 2022-10-18 DIAGNOSIS — I2510 Atherosclerotic heart disease of native coronary artery without angina pectoris: Secondary | ICD-10-CM

## 2022-10-18 DIAGNOSIS — M1990 Unspecified osteoarthritis, unspecified site: Secondary | ICD-10-CM

## 2022-10-18 DIAGNOSIS — N3281 Overactive bladder: Secondary | ICD-10-CM

## 2022-10-18 DIAGNOSIS — G459 Transient cerebral ischemic attack, unspecified: Secondary | ICD-10-CM

## 2022-10-18 DIAGNOSIS — M8580 Other specified disorders of bone density and structure, unspecified site: Secondary | ICD-10-CM

## 2022-10-18 DIAGNOSIS — Z91199 Personal history of noncompliance with medical treatment, presenting hazards to health: Secondary | ICD-10-CM

## 2022-10-18 DIAGNOSIS — R011 Cardiac murmur, unspecified: Secondary | ICD-10-CM

## 2022-10-18 DIAGNOSIS — I6350 Cerebral infarction due to unspecified occlusion or stenosis of unspecified cerebral artery: Secondary | ICD-10-CM

## 2022-10-18 DIAGNOSIS — Z9189 Other specified personal risk factors, not elsewhere classified: Secondary | ICD-10-CM

## 2022-10-18 DIAGNOSIS — J449 Chronic obstructive pulmonary disease, unspecified: Secondary | ICD-10-CM

## 2022-10-18 DIAGNOSIS — D649 Anemia, unspecified: Secondary | ICD-10-CM

## 2022-10-18 DIAGNOSIS — G43909 Migraine, unspecified, not intractable, without status migrainosus: Secondary | ICD-10-CM

## 2022-10-18 DIAGNOSIS — E785 Hyperlipidemia, unspecified: Secondary | ICD-10-CM

## 2022-10-18 DIAGNOSIS — F419 Anxiety disorder, unspecified: Secondary | ICD-10-CM

## 2022-10-18 DIAGNOSIS — S0510XA Contusion of eyeball and orbital tissues, unspecified eye, initial encounter: Secondary | ICD-10-CM

## 2022-10-18 DIAGNOSIS — F32A Depression: Secondary | ICD-10-CM

## 2022-10-18 DIAGNOSIS — Z96652 Presence of left artificial knee joint: Principal | ICD-10-CM

## 2022-10-18 MED ORDER — OXYCODONE HCL 5 MG PO TABS
5 mg | Freq: Four times a day (QID) | ORAL | 0 refills | 30.00000 days | Status: CP | PRN
Start: 2022-10-18 — End: ?

## 2022-10-18 MED ORDER — CELECOXIB 200 MG PO CAPS
200 mg | Freq: Two times a day (BID) | ORAL | 0 refills | Status: CP
Start: 2022-10-18 — End: ?

## 2022-10-29 ENCOUNTER — Encounter: Payer: Medicare Other | Primary: Internal Medicine

## 2022-11-01 ENCOUNTER — Encounter: Payer: Medicare Other | Attending: Sports Medicine | Primary: Internal Medicine

## 2022-11-01 ENCOUNTER — Inpatient Hospital Stay: Admit: 2022-11-01 | Discharge: 2022-11-02 | Payer: Medicare Other | Primary: Internal Medicine

## 2022-11-01 DIAGNOSIS — M1712 Unilateral primary osteoarthritis, left knee: Principal | ICD-10-CM

## 2022-11-14 DIAGNOSIS — R739 Hyperglycemia, unspecified: Principal | ICD-10-CM

## 2022-11-14 MED ORDER — MOUNJARO 2.5 MG/0.5ML SC SOPN
2.5 mg | SUBCUTANEOUS | 3 refills | 28.00000 days | Status: CP
Start: 2022-11-14 — End: ?

## 2022-11-15 ENCOUNTER — Ambulatory Visit: Payer: Medicare Other | Attending: Sports Medicine | Primary: Internal Medicine

## 2022-11-15 ENCOUNTER — Inpatient Hospital Stay: Admit: 2022-11-15 | Payer: Medicare Other | Primary: Internal Medicine

## 2022-11-15 DIAGNOSIS — R011 Cardiac murmur, unspecified: Secondary | ICD-10-CM

## 2022-11-15 DIAGNOSIS — S0510XA Contusion of eyeball and orbital tissues, unspecified eye, initial encounter: Secondary | ICD-10-CM

## 2022-11-15 DIAGNOSIS — M170 Bilateral primary osteoarthritis of knee: Principal | ICD-10-CM

## 2022-11-15 DIAGNOSIS — I6350 Cerebral infarction due to unspecified occlusion or stenosis of unspecified cerebral artery: Secondary | ICD-10-CM

## 2022-11-15 DIAGNOSIS — R739 Hyperglycemia, unspecified: Principal | ICD-10-CM

## 2022-11-15 DIAGNOSIS — N3281 Overactive bladder: Secondary | ICD-10-CM

## 2022-11-15 DIAGNOSIS — M8580 Other specified disorders of bone density and structure, unspecified site: Secondary | ICD-10-CM

## 2022-11-15 DIAGNOSIS — Z91199 Personal history of noncompliance with medical treatment, presenting hazards to health: Secondary | ICD-10-CM

## 2022-11-15 DIAGNOSIS — I8290 Acute embolism and thrombosis of unspecified vein: Secondary | ICD-10-CM

## 2022-11-15 DIAGNOSIS — R251 Tremor, unspecified: Secondary | ICD-10-CM

## 2022-11-15 DIAGNOSIS — F32A Depression: Secondary | ICD-10-CM

## 2022-11-15 DIAGNOSIS — F419 Anxiety disorder, unspecified: Secondary | ICD-10-CM

## 2022-11-15 DIAGNOSIS — K219 Gastro-esophageal reflux disease without esophagitis: Secondary | ICD-10-CM

## 2022-11-15 DIAGNOSIS — Z9189 Other specified personal risk factors, not elsewhere classified: Secondary | ICD-10-CM

## 2022-11-15 DIAGNOSIS — I2699 Other pulmonary embolism without acute cor pulmonale: Secondary | ICD-10-CM

## 2022-11-15 DIAGNOSIS — M1990 Unspecified osteoarthritis, unspecified site: Secondary | ICD-10-CM

## 2022-11-15 DIAGNOSIS — M3500 Sicca syndrome, unspecified: Secondary | ICD-10-CM

## 2022-11-15 DIAGNOSIS — J449 Chronic obstructive pulmonary disease, unspecified: Secondary | ICD-10-CM

## 2022-11-15 DIAGNOSIS — Z9109 Other allergy status, other than to drugs and biological substances: Secondary | ICD-10-CM

## 2022-11-15 DIAGNOSIS — D649 Anemia, unspecified: Secondary | ICD-10-CM

## 2022-11-15 DIAGNOSIS — G459 Transient cerebral ischemic attack, unspecified: Secondary | ICD-10-CM

## 2022-11-15 DIAGNOSIS — M329 Systemic lupus erythematosus, unspecified: Secondary | ICD-10-CM

## 2022-11-15 DIAGNOSIS — I2510 Atherosclerotic heart disease of native coronary artery without angina pectoris: Secondary | ICD-10-CM

## 2022-11-15 DIAGNOSIS — I10 Essential (primary) hypertension: Principal | ICD-10-CM

## 2022-11-15 DIAGNOSIS — E785 Hyperlipidemia, unspecified: Secondary | ICD-10-CM

## 2022-11-15 DIAGNOSIS — IMO0002 Atrial fib/flutter, transient: Secondary | ICD-10-CM

## 2022-11-15 DIAGNOSIS — G43909 Migraine, unspecified, not intractable, without status migrainosus: Secondary | ICD-10-CM

## 2022-11-15 MED ORDER — MOUNJARO 5 MG/0.5ML SC SOPN
5 mg | SUBCUTANEOUS | 2 refills | Status: CP
Start: 2022-11-15 — End: ?

## 2022-11-15 MED ORDER — MOUNJARO 2.5 MG/0.5ML SC SOPN
5 mg | SUBCUTANEOUS | 3 refills
Start: 2022-11-15 — End: ?

## 2022-11-20 ENCOUNTER — Ambulatory Visit: Payer: Medicare Other | Attending: Internal Medicine | Primary: Internal Medicine

## 2022-11-20 DIAGNOSIS — I10 Essential (primary) hypertension: Principal | ICD-10-CM

## 2022-11-20 DIAGNOSIS — M3500 Sicca syndrome, unspecified: Secondary | ICD-10-CM

## 2022-11-20 DIAGNOSIS — I8290 Acute embolism and thrombosis of unspecified vein: Secondary | ICD-10-CM

## 2022-11-20 DIAGNOSIS — D649 Anemia, unspecified: Secondary | ICD-10-CM

## 2022-11-20 DIAGNOSIS — M8580 Other specified disorders of bone density and structure, unspecified site: Secondary | ICD-10-CM

## 2022-11-20 DIAGNOSIS — M1990 Unspecified osteoarthritis, unspecified site: Secondary | ICD-10-CM

## 2022-11-20 DIAGNOSIS — Z91199 Personal history of noncompliance with medical treatment, presenting hazards to health: Secondary | ICD-10-CM

## 2022-11-20 DIAGNOSIS — R011 Cardiac murmur, unspecified: Secondary | ICD-10-CM

## 2022-11-20 DIAGNOSIS — E785 Hyperlipidemia, unspecified: Secondary | ICD-10-CM

## 2022-11-20 DIAGNOSIS — I6350 Cerebral infarction due to unspecified occlusion or stenosis of unspecified cerebral artery: Secondary | ICD-10-CM

## 2022-11-20 DIAGNOSIS — M797 Fibromyalgia: Secondary | ICD-10-CM

## 2022-11-20 DIAGNOSIS — Z79899 Other long term (current) drug therapy: Secondary | ICD-10-CM

## 2022-11-20 DIAGNOSIS — M329 Systemic lupus erythematosus, unspecified: Secondary | ICD-10-CM

## 2022-11-20 DIAGNOSIS — S0510XA Contusion of eyeball and orbital tissues, unspecified eye, initial encounter: Secondary | ICD-10-CM

## 2022-11-20 DIAGNOSIS — J449 Chronic obstructive pulmonary disease, unspecified: Secondary | ICD-10-CM

## 2022-11-20 DIAGNOSIS — F419 Anxiety disorder, unspecified: Secondary | ICD-10-CM

## 2022-11-20 DIAGNOSIS — G459 Transient cerebral ischemic attack, unspecified: Secondary | ICD-10-CM

## 2022-11-20 DIAGNOSIS — IMO0002 Atrial fib/flutter, transient: Secondary | ICD-10-CM

## 2022-11-20 DIAGNOSIS — E559 Vitamin D deficiency, unspecified: Secondary | ICD-10-CM

## 2022-11-20 DIAGNOSIS — I2510 Atherosclerotic heart disease of native coronary artery without angina pectoris: Secondary | ICD-10-CM

## 2022-11-20 DIAGNOSIS — Z9189 Other specified personal risk factors, not elsewhere classified: Secondary | ICD-10-CM

## 2022-11-20 DIAGNOSIS — Z9109 Other allergy status, other than to drugs and biological substances: Secondary | ICD-10-CM

## 2022-11-20 DIAGNOSIS — K219 Gastro-esophageal reflux disease without esophagitis: Secondary | ICD-10-CM

## 2022-11-20 DIAGNOSIS — F32A Depression: Secondary | ICD-10-CM

## 2022-11-20 DIAGNOSIS — N3281 Overactive bladder: Secondary | ICD-10-CM

## 2022-11-20 DIAGNOSIS — G43909 Migraine, unspecified, not intractable, without status migrainosus: Secondary | ICD-10-CM

## 2022-11-20 DIAGNOSIS — I2699 Other pulmonary embolism without acute cor pulmonale: Secondary | ICD-10-CM

## 2022-11-20 DIAGNOSIS — R251 Tremor, unspecified: Secondary | ICD-10-CM

## 2022-11-20 MED ORDER — PREDNISONE 5 MG PO TABS
5 mg | Freq: Every day | ORAL | 0 refills | 30.00 days | Status: CP
Start: 2022-11-20 — End: ?

## 2022-11-20 MED ORDER — PILOCARPINE HCL 5 MG PO TABS
5 mg | Freq: Two times a day (BID) | ORAL | 3 refills | Status: CP | PRN
Start: 2022-11-20 — End: ?

## 2022-11-22 ENCOUNTER — Ambulatory Visit: Payer: Medicare Other | Attending: Physician Assistant | Primary: Internal Medicine

## 2022-11-22 DIAGNOSIS — J449 Chronic obstructive pulmonary disease, unspecified: Secondary | ICD-10-CM

## 2022-11-22 DIAGNOSIS — R251 Tremor, unspecified: Secondary | ICD-10-CM

## 2022-11-22 DIAGNOSIS — M8580 Other specified disorders of bone density and structure, unspecified site: Secondary | ICD-10-CM

## 2022-11-22 DIAGNOSIS — M329 Systemic lupus erythematosus, unspecified: Secondary | ICD-10-CM

## 2022-11-22 DIAGNOSIS — I10 Essential (primary) hypertension: Secondary | ICD-10-CM

## 2022-11-22 DIAGNOSIS — I8290 Acute embolism and thrombosis of unspecified vein: Secondary | ICD-10-CM

## 2022-11-22 DIAGNOSIS — M1990 Unspecified osteoarthritis, unspecified site: Secondary | ICD-10-CM

## 2022-11-22 DIAGNOSIS — K219 Gastro-esophageal reflux disease without esophagitis: Secondary | ICD-10-CM

## 2022-11-22 DIAGNOSIS — IMO0002 Atrial fib/flutter, transient: Secondary | ICD-10-CM

## 2022-11-22 DIAGNOSIS — Z9189 Other specified personal risk factors, not elsewhere classified: Secondary | ICD-10-CM

## 2022-11-22 DIAGNOSIS — F32A Depression: Secondary | ICD-10-CM

## 2022-11-22 DIAGNOSIS — I6350 Cerebral infarction due to unspecified occlusion or stenosis of unspecified cerebral artery: Secondary | ICD-10-CM

## 2022-11-22 DIAGNOSIS — S0510XA Contusion of eyeball and orbital tissues, unspecified eye, initial encounter: Secondary | ICD-10-CM

## 2022-11-22 DIAGNOSIS — G43909 Migraine, unspecified, not intractable, without status migrainosus: Secondary | ICD-10-CM

## 2022-11-22 DIAGNOSIS — D649 Anemia, unspecified: Secondary | ICD-10-CM

## 2022-11-22 DIAGNOSIS — N3281 Overactive bladder: Secondary | ICD-10-CM

## 2022-11-22 DIAGNOSIS — G459 Transient cerebral ischemic attack, unspecified: Secondary | ICD-10-CM

## 2022-11-22 DIAGNOSIS — Z9109 Other allergy status, other than to drugs and biological substances: Secondary | ICD-10-CM

## 2022-11-22 DIAGNOSIS — E785 Hyperlipidemia, unspecified: Secondary | ICD-10-CM

## 2022-11-22 DIAGNOSIS — I2699 Other pulmonary embolism without acute cor pulmonale: Secondary | ICD-10-CM

## 2022-11-22 DIAGNOSIS — M3501 Sicca syndrome with keratoconjunctivitis: Secondary | ICD-10-CM

## 2022-11-22 DIAGNOSIS — F329 Major depressive disorder, single episode, unspecified: Secondary | ICD-10-CM

## 2022-11-22 DIAGNOSIS — M3500 Sicca syndrome, unspecified: Secondary | ICD-10-CM

## 2022-11-22 DIAGNOSIS — Z91199 Personal history of noncompliance with medical treatment, presenting hazards to health: Secondary | ICD-10-CM

## 2022-11-22 DIAGNOSIS — F419 Anxiety disorder, unspecified: Secondary | ICD-10-CM

## 2022-11-22 DIAGNOSIS — M797 Fibromyalgia: Secondary | ICD-10-CM

## 2022-11-22 DIAGNOSIS — Z6837 Body mass index (BMI) 37.0-37.9, adult: Principal | ICD-10-CM

## 2022-11-22 DIAGNOSIS — I2510 Atherosclerotic heart disease of native coronary artery without angina pectoris: Secondary | ICD-10-CM

## 2022-11-22 DIAGNOSIS — R011 Cardiac murmur, unspecified: Secondary | ICD-10-CM

## 2022-11-22 MED ORDER — VALSARTAN-HYDROCHLOROTHIAZIDE 320-25 MG PO TABS
1 | ORAL_TABLET | Freq: Every day | ORAL | 3 refills | Status: CP
Start: 2022-11-22 — End: ?

## 2022-11-22 MED ORDER — DULOXETINE HCL 30 MG PO CPEP
30 mg | Freq: Two times a day (BID) | ORAL | 2 refills | Status: CP
Start: 2022-11-22 — End: ?

## 2022-11-22 MED ORDER — CYCLOBENZAPRINE HCL 10 MG PO TABS
10 mg | Freq: Every evening | ORAL | 1 refills | Status: CP
Start: 2022-11-22 — End: ?

## 2022-11-22 MED ORDER — PREGABALIN 100 MG PO CAPS
100 mg | Freq: Two times a day (BID) | ORAL | 2 refills | 30.00000 days | Status: CP
Start: 2022-11-22 — End: ?

## 2022-11-29 ENCOUNTER — Encounter: Payer: Medicare Other | Attending: Sports Medicine | Primary: Internal Medicine

## 2022-12-10 ENCOUNTER — Encounter: Payer: Medicare Other | Primary: Internal Medicine

## 2022-12-13 ENCOUNTER — Encounter: Payer: Medicare Other | Attending: Sports Medicine | Primary: Internal Medicine

## 2022-12-20 DIAGNOSIS — M797 Fibromyalgia: Principal | ICD-10-CM

## 2022-12-20 MED ORDER — PREGABALIN 100 MG PO CAPS
100 mg | Freq: Two times a day (BID) | ORAL | 2 refills
Start: 2022-12-20 — End: ?

## 2022-12-23 DIAGNOSIS — I10 Essential (primary) hypertension: Principal | ICD-10-CM

## 2022-12-23 MED ORDER — DILTIAZEM HCL ER 180 MG PO CP24
ORAL | 3 refills | 30.00000 days | Status: CP
Start: 2022-12-23 — End: ?

## 2022-12-27 ENCOUNTER — Ambulatory Visit: Payer: Medicare Other | Attending: Sports Medicine | Primary: Internal Medicine

## 2022-12-31 ENCOUNTER — Encounter (HOSPITAL_BASED_OUTPATIENT_CLINIC_OR_DEPARTMENT_OTHER): Payer: Self-pay

## 2022-12-31 ENCOUNTER — Inpatient Hospital Stay (HOSPITAL_BASED_OUTPATIENT_CLINIC_OR_DEPARTMENT_OTHER)
Admission: EM | Admit: 2022-12-31 | Discharge: 2023-01-02 | DRG: 103 | Disposition: A | Discharge status: Home / Self Care | Payer: 59 | Attending: Neurology | Admitting: Neurology

## 2022-12-31 ENCOUNTER — Emergency Department (HOSPITAL_BASED_OUTPATIENT_CLINIC_OR_DEPARTMENT_OTHER): Payer: 59

## 2022-12-31 ENCOUNTER — Other Ambulatory Visit: Payer: Self-pay

## 2022-12-31 DIAGNOSIS — H532 Diplopia: Secondary | ICD-10-CM | POA: Diagnosis present

## 2022-12-31 DIAGNOSIS — Z86711 Personal history of pulmonary embolism: Secondary | ICD-10-CM

## 2022-12-31 DIAGNOSIS — R2990 Unspecified symptoms and signs involving the nervous system: Secondary | ICD-10-CM | POA: Diagnosis not present

## 2022-12-31 DIAGNOSIS — Z886 Allergy status to analgesic agent status: Secondary | ICD-10-CM | POA: Diagnosis not present

## 2022-12-31 DIAGNOSIS — Z6839 Body mass index (BMI) 39.0-39.9, adult: Secondary | ICD-10-CM | POA: Diagnosis not present

## 2022-12-31 DIAGNOSIS — Z888 Allergy status to other drugs, medicaments and biological substances status: Secondary | ICD-10-CM | POA: Diagnosis not present

## 2022-12-31 DIAGNOSIS — I6300 Cerebral infarction due to thrombosis of unspecified precerebral artery: Secondary | ICD-10-CM | POA: Diagnosis not present

## 2022-12-31 DIAGNOSIS — R4701 Aphasia: Secondary | ICD-10-CM | POA: Diagnosis present

## 2022-12-31 DIAGNOSIS — M329 Systemic lupus erythematosus, unspecified: Secondary | ICD-10-CM | POA: Diagnosis present

## 2022-12-31 DIAGNOSIS — Z7952 Long term (current) use of systemic steroids: Secondary | ICD-10-CM

## 2022-12-31 DIAGNOSIS — Z7982 Long term (current) use of aspirin: Secondary | ICD-10-CM | POA: Diagnosis not present

## 2022-12-31 DIAGNOSIS — E785 Hyperlipidemia, unspecified: Secondary | ICD-10-CM | POA: Diagnosis present

## 2022-12-31 DIAGNOSIS — I6389 Other cerebral infarction: Secondary | ICD-10-CM | POA: Diagnosis not present

## 2022-12-31 DIAGNOSIS — M797 Fibromyalgia: Secondary | ICD-10-CM | POA: Diagnosis present

## 2022-12-31 DIAGNOSIS — E669 Obesity, unspecified: Secondary | ICD-10-CM | POA: Diagnosis present

## 2022-12-31 DIAGNOSIS — E119 Type 2 diabetes mellitus without complications: Secondary | ICD-10-CM | POA: Diagnosis present

## 2022-12-31 DIAGNOSIS — I639 Cerebral infarction, unspecified: Secondary | ICD-10-CM | POA: Diagnosis not present

## 2022-12-31 DIAGNOSIS — Z79899 Other long term (current) drug therapy: Secondary | ICD-10-CM

## 2022-12-31 DIAGNOSIS — G43009 Migraine without aura, not intractable, without status migrainosus: Secondary | ICD-10-CM | POA: Diagnosis not present

## 2022-12-31 DIAGNOSIS — G43109 Migraine with aura, not intractable, without status migrainosus: Principal | ICD-10-CM | POA: Diagnosis present

## 2022-12-31 DIAGNOSIS — Z8673 Personal history of transient ischemic attack (TIA), and cerebral infarction without residual deficits: Secondary | ICD-10-CM

## 2022-12-31 DIAGNOSIS — M3500 Sicca syndrome, unspecified: Secondary | ICD-10-CM | POA: Diagnosis present

## 2022-12-31 DIAGNOSIS — I10 Essential (primary) hypertension: Secondary | ICD-10-CM | POA: Diagnosis present

## 2022-12-31 DIAGNOSIS — M1990 Unspecified osteoarthritis, unspecified site: Secondary | ICD-10-CM | POA: Diagnosis present

## 2022-12-31 DIAGNOSIS — G43919 Migraine, unspecified, intractable, without status migrainosus: Secondary | ICD-10-CM

## 2022-12-31 DIAGNOSIS — Z7985 Long-term (current) use of injectable non-insulin antidiabetic drugs: Secondary | ICD-10-CM

## 2022-12-31 DIAGNOSIS — G43909 Migraine, unspecified, not intractable, without status migrainosus: Secondary | ICD-10-CM | POA: Insufficient documentation

## 2022-12-31 HISTORY — DX: Cerebral infarction, unspecified: I63.9

## 2022-12-31 LAB — COMPREHENSIVE METABOLIC PANEL
ALT: 12 U/L (ref 0–44)
AST: 20 U/L (ref 15–41)
Albumin: 3.4 g/dL — ABNORMAL LOW (ref 3.5–5.0)
Alkaline Phosphatase: 66 U/L (ref 38–126)
Anion gap: 8 (ref 5–15)
BUN: 9 mg/dL (ref 6–20)
CO2: 24 mmol/L (ref 22–32)
Calcium: 8.5 mg/dL — ABNORMAL LOW (ref 8.9–10.3)
Chloride: 101 mmol/L (ref 98–111)
Creatinine, Ser: 0.77 mg/dL (ref 0.44–1.00)
GFR, Estimated: >60 mL/min (ref >60)
Glucose, Bld: 97 mg/dL (ref 70–99)
Potassium: 3.6 mmol/L (ref 3.5–5.1)
Sodium: 133 mmol/L — ABNORMAL LOW (ref 135–145)
Total Bilirubin: 0.3 mg/dL (ref 0.3–1.2)
Total Protein: 7 g/dL (ref 6.5–8.1)

## 2022-12-31 LAB — DIFFERENTIAL
Abs Immature Granulocytes: 0.01 10*3/uL (ref 0.00–0.07)
Basophils Absolute: 0 10*3/uL (ref 0.0–0.1)
Basophils Relative: 0 %
Eosinophils Absolute: 0 10*3/uL (ref 0.0–0.5)
Eosinophils Relative: 0 %
Immature Granulocytes: 0 %
Lymphocytes Relative: 47 %
Lymphs Abs: 1.7 10*3/uL (ref 0.7–4.0)
Monocytes Absolute: 0.2 10*3/uL (ref 0.1–1.0)
Monocytes Relative: 6 %
Neutro Abs: 1.7 10*3/uL (ref 1.7–7.7)
Neutrophils Relative %: 47 %

## 2022-12-31 LAB — CBC
HCT: 37.1 % (ref 36.0–46.0)
Hemoglobin: 11.8 g/dL — ABNORMAL LOW (ref 12.0–15.0)
MCH: 26.6 pg (ref 26.0–34.0)
MCHC: 31.8 g/dL (ref 30.0–36.0)
MCV: 83.6 fL (ref 80.0–100.0)
Platelets: 252 10*3/uL (ref 150–400)
RBC: 4.44 MIL/uL (ref 3.87–5.11)
RDW: 14.8 % (ref 11.5–15.5)
WBC: 3.6 10*3/uL — ABNORMAL LOW (ref 4.0–10.5)
nRBC: 0 % (ref 0.0–0.2)

## 2022-12-31 LAB — ETHANOL: Alcohol, Ethyl (B): <10 mg/dL (ref <10)

## 2022-12-31 LAB — PREGNANCY, URINE: Preg Test, Ur: NEGATIVE

## 2022-12-31 LAB — PROTIME-INR
INR: 0.9 (ref 0.8–1.2)
Prothrombin Time: 12.4 seconds (ref 11.4–15.2)

## 2022-12-31 LAB — HEMOGLOBIN A1C
Hgb A1c MFr Bld: 5.7 % — ABNORMAL HIGH (ref 4.8–5.6)
Hgb A1c MFr Bld: 5.6 % (ref 4.8–5.6)
Mean Plasma Glucose: 116.89 mg/dL
Mean Plasma Glucose: 114.02 mg/dL

## 2022-12-31 LAB — CBG MONITORING, ED: Glucose-Capillary: 95 mg/dL (ref 70–99)

## 2022-12-31 LAB — MRSA NEXT GEN BY PCR, NASAL: MRSA by PCR Next Gen: NOT DETECTED

## 2022-12-31 LAB — HIV ANTIBODY (ROUTINE TESTING W REFLEX): HIV Screen 4th Generation wRfx: NONREACTIVE

## 2022-12-31 MED ORDER — CYCLOBENZAPRINE HCL 10 MG PO TABS
10.0000 mg | ORAL_TABLET | Freq: Every day | ORAL | Status: DC
  Administered 2022-12-31 – 2023-01-01 (×2): 10 mg via ORAL
  Filled 2022-12-31 (×2): qty 1

## 2022-12-31 MED ORDER — PREDNISONE 5 MG PO TABS
5.0000 mg | ORAL_TABLET | Freq: Every day | ORAL | Status: DC
  Administered 2023-01-01 – 2023-01-02 (×2): 5 mg via ORAL
  Filled 2022-12-31 (×3): qty 1

## 2022-12-31 MED ORDER — CYCLOSPORINE 0.05 % OP EMUL
1.0000 [drp] | Freq: Two times a day (BID) | OPHTHALMIC | Status: DC
  Administered 2022-12-31 – 2023-01-02 (×4): 1 [drp] via OPHTHALMIC
  Filled 2022-12-31 (×5): qty 30

## 2022-12-31 MED ORDER — SODIUM CHLORIDE 0.9% FLUSH
3.0000 mL | Freq: Once | INTRAVENOUS | Status: DC
  Filled 2022-12-31: qty 3

## 2022-12-31 MED ORDER — PREGABALIN 75 MG PO CAPS
100.0000 mg | ORAL_CAPSULE | Freq: Two times a day (BID) | ORAL | Status: DC
  Administered 2022-12-31 – 2023-01-02 (×4): 100 mg via ORAL
  Filled 2022-12-31 (×4): qty 1

## 2022-12-31 MED ORDER — PANTOPRAZOLE SODIUM 40 MG IV SOLR
40.0000 mg | Freq: Every day | INTRAVENOUS | Status: DC
  Administered 2022-12-31 – 2023-01-01 (×2): 40 mg via INTRAVENOUS
  Filled 2022-12-31 (×2): qty 10

## 2022-12-31 MED ORDER — LABETALOL HCL 5 MG/ML IV SOLN: 5.0000 mg | INTRAVENOUS | Status: DC | PRN

## 2022-12-31 MED ORDER — STROKE: EARLY STAGES OF RECOVERY BOOK
Freq: Once | Status: DC
  Filled 2022-12-31 (×2): qty 1

## 2022-12-31 MED ORDER — SENNOSIDES-DOCUSATE SODIUM 8.6-50 MG PO TABS: 1.0000 | ORAL_TABLET | Freq: Every evening | ORAL | Status: DC | PRN

## 2022-12-31 MED ORDER — CHLORHEXIDINE GLUCONATE CLOTH 2 % EX PADS
6.0000 | MEDICATED_PAD | Freq: Every day | CUTANEOUS | Status: DC
  Administered 2023-01-01: 6 via TOPICAL

## 2022-12-31 MED ORDER — ORAL CARE MOUTH RINSE: 15.0000 mL | OROMUCOSAL | Status: DC | PRN

## 2022-12-31 MED ORDER — IOHEXOL 350 MG/ML SOLN
100.0000 mL | Freq: Once | INTRAVENOUS | Status: AC | PRN
  Administered 2022-12-31: 100 mL via INTRAVENOUS

## 2022-12-31 MED ORDER — SODIUM CHLORIDE 0.9 % IV SOLN: INTRAVENOUS | Status: DC

## 2022-12-31 MED ORDER — TENECTEPLASE FOR STROKE: 0.2500 mg/kg | PACK | Freq: Once | INTRAVENOUS | Status: DC

## 2022-12-31 MED ORDER — DULOXETINE HCL 30 MG PO CPEP
30.0000 mg | ORAL_CAPSULE | Freq: Two times a day (BID) | ORAL | Status: DC
  Administered 2022-12-31 – 2023-01-02 (×4): 30 mg via ORAL
  Filled 2022-12-31 (×5): qty 1

## 2022-12-31 MED ORDER — TENECTEPLASE FOR STROKE
25.0000 mg | PACK | Freq: Once | INTRAVENOUS | Status: AC
  Administered 2022-12-31: 25 mg via INTRAVENOUS

## 2022-12-31 NOTE — H&P (Addendum)
NEUROLOGY CONSULTATION NOTE   Date of service: December 31, 2022 Patient Name: Michelle Nash MRN:  [id number] DOB:  March 11, 1966  _ _ _   _ __   _ __ _ _  __ __   _ __   __ _  History of Present Illness  Michelle Nash is a 57 y.o. female with PMH significant for SLE, OA, fibromyalgia, HTN, TIA vs complex migraines, Sjogrens, obesity who presented with sudden onset stuttering speech, tongue numbness and heaviness, R face parestehesias and mild L sided weakness. LKW 1600. NIHSs of 6.  She was on the phone with her friend when she ws having trouble talking, tongue was think and slurring speech at the same time and felt weak on the left.  She was evaluated by teleneurology and given tnkase.  She was transferred to [facility] Neuro ICU for close monitoring.  Reports BP runs in 120s at home, does not smoke, no recreational substances, no EtOh use.  Prior TIA/complex migraine in 2022 but this was much different per patient. She has a mild headache right now.  LKW: 1600 on 12/31/22 mRS: 0 tNKASE: given at [facility] Thrombectomy: not offered, no LVO NIHSS components Score: Comment  1a Level of Conscious 0'[x]'$  1'[]'$  2'[]'$  3'[]'$      1b LOC Questions 0'[x]'$  1'[]'$  2'[]'$       1c LOC Commands 0'[x]'$  1'[]'$  2'[]'$       2 Best Gaze 0'[x]'$  1'[]'$  2'[]'$       3 Visual 0'[x]'$  1'[]'$  2'[]'$  3'[]'$      4 Facial Palsy 0'[x]'$  1'[]'$  2'[]'$  3'[]'$      5a Motor Arm - left 0'[x]'$  1'[]'$  2'[]'$  3'[]'$  4'[]'$  UN'[]'$    5b Motor Arm - Right 0'[x]'$  1'[]'$  2'[]'$  3'[]'$  4'[]'$  UN'[]'$    6a Motor Leg - Left 0'[]'$  1'[x]'$  2'[]'$  3'[]'$  4'[]'$  UN'[]'$    6b Motor Leg - Right 0'[x]'$  1'[]'$  2'[]'$  3'[]'$  4'[]'$  UN'[]'$    7 Limb Ataxia 0'[x]'$  1'[]'$  2'[]'$  3'[]'$  UN'[]'$     8 Sensory 0'[x]'$  1'[]'$  2'[]'$  UN'[]'$      9 Best Language 0'[x]'$  1'[]'$  2'[]'$  3'[]'$      10 Dysarthria 0'[x]'$  1'[]'$  2'[]'$  UN'[]'$      11 Extinct. and Inattention 0'[x]'$  1'[]'$  2'[]'$       TOTAL: 1       ROS   Constitutional Denies weight loss, fever and chills.   HEENT Denies changes in vision and hearing.   Respiratory Denies SOB and cough.   CV Denies palpitations and CP   GI Denies abdominal pain, nausea,  vomiting and diarrhea.   GU Denies dysuria and urinary frequency.   MSK Denies myalgia and joint pain.   Skin Denies rash and pruritus.  Neurological Mild headache but no syncope.   Psychiatric Denies recent changes in mood. Denies anxiety and depression.    Past History   Past Medical History:  Diagnosis Date   Coronary artery spasm (HCC)    Fibromyalgia    Lupus ([place])    Osteoarthritis    Pulmonary emboli (HCC)    Sjogren's disease ([place])    Stroke [facility])    Past Surgical History:  Procedure Laterality Date   CESAREAN SECTION     CHOLECYSTECTOMY     No family history on file. Social History   Socioeconomic History   Marital status: Single    Spouse name: Not on file   Number of children: Not on file   Years of education: Not on file   Highest education level: Not on file  Occupational History   Not on  file  Tobacco Use   Smoking status: Never   Smokeless tobacco: Never  Vaping Use   Vaping Use: Never used  Substance and Sexual Activity   Alcohol use: Never   Drug use: Never   Sexual activity: Not on file  Other Topics Concern   Not on file  Social History Narrative   Not on file   Social Determinants of Health   Financial Resource Strain: Not on file  Food Insecurity: Not on file  Transportation Needs: Not on file  Physical Activity: Not on file  Stress: Not on file  Social Connections: Not on file   Allergies  Allergen Reactions   Lisinopril Cough    ACE cough   Tylenol [Acetaminophen] Nausea And Vomiting    Medications   Medications Prior to Admission  Medication Sig Dispense Refill Last Dose   cyclobenzaprine (FLEXERIL) 10 MG tablet Take 1 tablet by mouth at bedtime.   12/30/2022   cycloSPORINE (RESTASIS) 0.05 % ophthalmic emulsion Place 1 drop into both eyes 2 (two) times daily.   12/31/2022   diltiazem (CARDIZEM CD) 180 MG 24 hr capsule Take 180 mg by mouth every evening.   12/30/2022   DULoxetine (CYMBALTA) 30 MG capsule Take 30 mg by mouth 2  (two) times daily.   12/31/2022   Erenumab-aooe (AIMOVIG) 140 MG/ML SOAJ Inject 140 mg into the skin every 30 (thirty) days.   12/30/2022   hydroxychloroquine (PLAQUENIL) 200 MG tablet Take 200 mg by mouth 2 (two) times daily.   12/31/2022   MOUNJARO 5 MG/0.5ML Pen Inject 5 mg into the skin once a week.   12/27/2022   pilocarpine (SALAGEN) 5 MG tablet Take 5 mg by mouth daily as needed (Dry mouth).   unknown   predniSONE (DELTASONE) 5 MG tablet Take 5 mg by mouth daily with breakfast.   12/31/2022   pregabalin (LYRICA) 100 MG capsule Take 100 mg by mouth 2 (two) times daily.   12/31/2022   valsartan-hydrochlorothiazide (DIOVAN-HCT) 320-25 MG tablet Take 1 tablet by mouth daily.   12/31/2022   aspirin EC 81 MG EC tablet Take 1 tablet (81 mg total) by mouth daily. Swallow whole. (Patient not taking: Reported on 12/31/2022) 30 tablet 11 Not Taking   benzonatate (TESSALON) 100 MG capsule Take 1 capsule (100 mg total) by mouth 3 (three) times daily as needed for cough. (Patient not taking: Reported on 12/31/2022) 15 capsule 0 Not Taking   MOUNJARO 2.5 MG/0.5ML Pen Inject 2.5 mg into the skin once a week. (Patient not taking: Reported on 12/31/2022)   Not Taking   promethazine-dextromethorphan (PROMETHAZINE-DM) 6.25-15 MG/5ML syrup Take 5 mLs by mouth 4 (four) times daily as needed for cough. (Patient not taking: Reported on 12/31/2022) 118 mL 0 Not Taking     Vitals   Vitals:   12/31/22 2100 12/31/22 2130 12/31/22 2200 12/31/22 2230  BP: (!) 145/87 132/84 (!) 134/91 119/72  Pulse: 71 73 72 71  Resp: '18 14 16 17  '$ Temp:      TempSrc:      SpO2: 98% 100% 98% 98%  Weight:      Height:         Body mass index is 39.18 kg/m.  Physical Exam   General: Laying comfortably in bed; in no acute distress.  HENT: Normal oropharynx and mucosa. Normal external appearance of ears and nose.  Neck: Supple, no pain or tenderness  CV: No JVD. No peripheral edema.  Pulmonary: Symmetric Chest rise. Normal respiratory  effort.  Abdomen: Soft to touch, non-tender.  Ext: No cyanosis, edema, or deformity  Skin: No rash. Normal palpation of skin.   Musculoskeletal: Normal digits and nails by inspection. No clubbing.   Neurologic Examination  Mental status/Cognition: Alert, oriented to self, place, month and year, good attention.  Speech/language: Fluent, comprehension intact, object naming intact, repetition intact.  Cranial nerves:   CN II Pupils equal and reactive to light, no VF deficits    CN III,IV,VI EOM intact, no gaze preference or deviation, no nystagmus   CN V normal sensation in V1, V2, and V3 segments bilaterally    CN VII no asymmetry, no nasolabial fold flattening    CN VIII normal hearing to speech    CN IX & X normal palatal elevation, no uvular deviation    CN XI 5/5 head turn and 5/5 shoulder shrug bilaterally    CN XII midline tongue protrusion    Motor:  Muscle bulk: normal, tone normal, pronator drift none tremor none Mvmt Root Nerve  Muscle Right Left Comments  SA C5/6 Ax Deltoid 5 5   EF C5/6 Mc Biceps 5 5   EE C6/7/8 Rad Triceps 5 5   WF C6/7 Med FCR     WE C7/8 PIN ECU     F Ab C8/T1 U ADM/FDI 5 5   HF L1/2/3 Fem Illopsoas 5 4+   KE L2/3/4 Fem Quad 5 4+   DF L4/5 D Peron Tib Ant 5 5   PF S1/2 Tibial Grc/Sol 5 5    Sensation:  Light touch Intact throughout   Pin prick    Temperature    Vibration   Proprioception    Coordination/Complex Motor:  - Finger to Nose intact BL - Heel to shin intact BL - Rapid alternating movement are normal - Gait: deferred/  Labs   CBC:  Recent Labs  Lab 12/31/22 1743  WBC 3.6*  NEUTROABS 1.7  HGB 11.8*  HCT 37.1  MCV 83.6  PLT 950    Basic Metabolic Panel:  Lab Results  Component Value Date   NA 133 (L) 12/31/2022   K 3.6 12/31/2022   CO2 24 12/31/2022   GLUCOSE 97 12/31/2022   BUN 9 12/31/2022   CREATININE 0.77 12/31/2022   CALCIUM 8.5 (L) 12/31/2022   GFRNONAA >60 12/31/2022   Lipid Panel:  Lab Results   Component Value Date   LDLCALC 84 05/03/2021   HgbA1c:  Lab Results  Component Value Date   HGBA1C 5.6 12/31/2022   Urine Drug Screen: No results found for: "LABOPIA", "COCAINSCRNUR", "LABBENZ", "AMPHETMU", "THCU", "LABBARB"  Alcohol Level     Component Value Date/Time   ETH <10 12/31/2022 1743    CT Head without contrast(Personally reviewed): CTH was negative for a large hypodensity concerning for a large territory infarct or hyperdensity concerning for an ICH  CT angio Head and Neck with contrast(Personally reviewed): No LVO  MRI Brain: pending  Impression   Michelle Nash is a 57 y.o. female with PMH significant for SLE, OA, fibromyalgia, HTN, TIA vs complex migraines, Sjogrens, obesity who presented with sudden onset stuttering speech, tongue numbness and heaviness, R face parestehesias and mild L sided weakness. LKW 1600. NIHSs of 6. She was evaluated by telenuerology and given tnkase and transferred to [facility] Neuro ICU for close monitoring. Her neurologic examination is notable for mild LLE weakness.  Suspect stroke vs hemiplegic migraine vs functional neurological weakness.  Recommendations  Sudden onset stuttering speech, tongue numbness and heaviness, R  face parestehesias and mild L sided weakness concerning for stroke and s/p tnkase: - Frequent NeuroChecks for post tNK care per stroke unit protocol: - Initial CTH demonstrated no acute hemorrhage or mass - MRI Brain - pending - CTA - no LVO - TTE - pending - Lipid Panel: LDL - pending  - Statin: if LDL > 70 - HbA1c: pending. - Antithrombotic: Start ASA 81 mg daily if 24 h CTH does not show acute hemorrhage - DVT prophylaxis: SCDs. Pharmacologic prophylaxis if 24 h CTH does not demonstrate acute hemorrhage - Systolic Blood Pressure goal: < 180 mm Hg - Telemetry monitoring for arrhythmia: 72 hours - Swallow screen - ordered - PT/OT/SLP consults  Hypertension:  Goal BP as above. Hold home meds. PRN meds if BP above  goal   Lupus  Resume home meds.  Fibromyalgia: Resume home meds.   ______________________________________________________________________  This patient is critically ill and at significant risk of neurological worsening, death and care requires constant monitoring of vital signs, hemodynamics,respiratory and cardiac monitoring, neurological assessment, discussion with family, other specialists and medical decision making of high complexity. I spent 30 minutes of neurocritical care time  in the care of  this patient. This was time spent independent of any time provided by nurse practitioner or PA.  [place] Pager Number [phone] 12/31/2022  11:20 PM  Thank you for the opportunity to take part in the care of this patient. If you have any further questions, please contact the neurology consultation attending.  Signed,  [place] Pager Number [phone] _ _ _   _ __   _ __ _ _  __ __   _ __   __ _

## 2022-12-31 NOTE — ED Triage Notes (Addendum)
Pt reports sudden onset of numbness to her tongue, slurred/stuttered speech and headache, onset today at 4pm when she was on the phone. She reports hx of stroke a year ago. No weakness.

## 2022-12-31 NOTE — Consult Note (Signed)
[facility] Telemedicine Consult   Requesting Provider: Sherwood Gambler Consult Participants: Myself, [facility] nurse Misty, multiple bedside staff, daughters via phone, patient Location of the provider: [place]  Location of the patient: [facility] ED   This consult was provided via telemedicine with 2-way video and audio communication. The patient/family was informed that care would be provided in this way and agreed to receive care in this manner.   Chief Complaint: Stuttering speech, HA, tongue heaviness,   HPI: This is a 57 y.o. w/ PMHx significant for hypertension, possible diabetes, TIA vs. Complex migraine, SLE, Sjogren's, osteoarthritis, obesity, fibromyalgia, depression,   She reports she was in her usual state of health recently except that she had some mild difficulty with her tongue feeling heavy yesterday, 2 brief episodes with full return to baseline.  Today she was talking on the phone when she had sudden onset stuttering speech, tongue heaviness/numbness, and paresthesias right greater than left face.  She reports minimal migraine currently but on triage notes reported some worsening headache with this episode.  She notes she did have a similar episode in the past which was diagnosed as either complex migraine versus TIA.  She has continued her migraine medications  LKW: 1600  Thrombolytic given?: Yes 1754 Checklist of contradindications was reviewed.  Risks, benefits and alternatives were discussed Delayed due to needing to call family for informed consent and difficulty obtaining history due to mild aphasia, as well as due to difficulty IV access  IR Thrombectomy? No,  Modified Rankin Scale: 0-Completely asymptomatic and back to baseline post- stroke Time of teleneurologist evaluation: 5:18 PM  Exam: Current vital signs: BP 127/83   Pulse 76   Temp 98.6 F (37 C)   Resp 18   Ht '5\' 6"'$  (1.676 m)   Wt 110.1 kg   SpO2 99%   BMI 39.18 kg/m  Vital signs in last  24 hours: Temp:  [98.6 F (37 C)] 98.6 F (37 C) (01/16 1800) Pulse Rate:  [76-79] 76 (01/16 1800) Resp:  [18] 18 (01/16 1800) BP: (127-143)/(83-95) 127/83 (01/16 1800) SpO2:  [98 %-100 %] 99 % (01/16 1800) Weight:  [110.1 kg] 110.1 kg (01/16 1809)   General: Mildly anxious appearing, otherwise no acute distress Pulmonary: breathing comfortably Cardiac: regular rate and rhythm on monitor   NIH Stroke scale 1A: Level of Consciousness - 0 1B: Ask Month and Age - 0 1C: 'Blink Eyes' & 'Squeeze Hands' - 0 2: Test Horizontal Extraocular Movements - 0 3: Test Visual Fields - 0 4: Test Facial Palsy - 0 5A: Test Left Arm Motor Drift - 1 5B: Test Right Arm Motor Drift - 0 6A: Test Left Leg Motor Drift - 1 6B: Test Right Leg Motor Drift - 1 7: Test Limb Ataxia - 1 (RUE vs. Secondary to transient horizontal diplopia, not present initially) 8: Test Sensation - 1 reduced in right face and leg but NOT arm  9: Test Language/Aphasia- 1 for loss of fluency 10: Test Dysarthria - 0 11: Test Extinction/Inattention - 0 NIHSS score: 6   Imaging Reviewed:  Head CT personally reviewed, agree with radiology no acute intracranial process  Labs reviewed in epic and pertinent values follow: Labs were still pending at the time of my evaluation  Basic Metabolic Panel: No results for input(s): "NA", "K", "CL", "CO2", "GLUCOSE", "BUN", "CREATININE", "CALCIUM", "MG", "PHOS" in the last 168 hours.  CBC: Recent Labs  Lab 12/31/22 1743  WBC 3.6*  NEUTROABS 1.7  HGB 11.8*  HCT 37.1  MCV 83.6  PLT 252    Coagulation Studies: No results for input(s): "LABPROT", "INR" in the last 72 hours.    Assessment: Strong possibility of complex migraine as discussed with family and patient.  However the patient does have significant stroke risk factors as documented above and diplopia, crossed findings of left-sided weakness and right-sided facial numbness with tongue symptoms could localize to the brainstem,  and decision was made to proceed with TNK with the possibility that this was a stroke given no contraindications to the medication.  Recommendations:  # Possible stroke/TIA vs. Complex migraine  - Stroke labs HgbA1c, fasting lipid panel - MRI brain  - CTA head/neck - Frequent neuro checks - Echocardiogram - Hold antiplatelets for 24 hours post TNK until stability scan - Risk factor modification - Telemetry monitoring; 30 day event monitor on discharge if no arrythmias captured  - Blood pressure goal   - Post TNK for 24  hours < 180/105 - PT consult, OT consult, Speech consult, unless patient is back to baseline - Stroke team admission, discussed with Dr. Quinn Axe  This patient is receiving care for possible acute neurological changes. There was 70 minutes of care by this provider at the time of service, including time for direct evaluation via telemedicine, review of medical records, imaging studies and discussion of findings with providers, the patient and/or family.  Lesleigh Noe MD-PhD [facility] [phone]   If 8pm-8am, please page neurology on call as listed in [place].  CRITICAL CARE Performed by: Lorenza Chick   Total critical care time: 70 minutes  Critical care time was exclusive of separately billable procedures and treating other patients.  Critical care was necessary to treat or prevent imminent or life-threatening deterioration.  Critical care was time spent personally by me on the following activities: development of treatment plan with patient and/or surrogate as well as nursing, discussions with consultants, evaluation of patient's response to treatment, examination of patient, obtaining history from patient or surrogate, ordering and performing treatments and interventions, ordering and review of laboratory studies, ordering and review of radiographic studies, pulse oximetry and re-evaluation of patient's condition.

## 2022-12-31 NOTE — ED Notes (Signed)
Report given to [facility]  °

## 2022-12-31 NOTE — ED Notes (Addendum)
Pt transported to [facility] in to transport

## 2022-12-31 NOTE — ED Provider Notes (Signed)
[facility] Provider Note   CSN: [id number] Arrival date & time: 12/31/22  1656  An emergency department physician performed an initial assessment on this suspected stroke patient at 1715.  History  Chief Complaint  Patient presents with   Aphasia   Code Stroke    Michelle Nash is a 57 y.o. female.  HPI 57 year old female with a tree of fibromyalgia, lupus, Sjogren's presents with concern for stroke.  At around 4-4:15 PM she was on the phone and noticed that her tongue felt heavy and numb.  It was diffuse.  She was having trouble getting the right words out but also felt like she was stuttering.  She feels like there is a little bit of numbness in her face, right jaw worse than left.  She has not noticed any focal numbness or weakness to her extremities but she is having a gradually worsening headache in the front of her head since this time.  She also has diffuse blurry vision.  She states this is similar to when she was diagnosed with a TIA versus silent migraine per her report.  This occurred over a year ago.  Home Medications Prior to Admission medications   Medication Sig Start Date End Date Taking? Authorizing Provider  cyclobenzaprine (FLEXERIL) 10 MG tablet Take 1 tablet by mouth at bedtime. 01/30/21  Yes [provider]  cycloSPORINE (RESTASIS) 0.05 % ophthalmic emulsion Place 1 drop into both eyes 2 (two) times daily. 12/20/19  Yes [provider]  diltiazem (CARDIZEM CD) 180 MG 24 hr capsule Take 180 mg by mouth every evening.   Yes [provider]  DULoxetine (CYMBALTA) 30 MG capsule Take 30 mg by mouth 2 (two) times daily.   Yes [provider]  Erenumab-aooe (AIMOVIG) 140 MG/ML SOAJ Inject 140 mg into the skin every 30 (thirty) days. 12/20/19  Yes [provider]  hydroxychloroquine (PLAQUENIL) 200 MG tablet Take 200 mg by mouth 2 (two) times daily. 04/19/21  Yes [provider]  MOUNJARO 5  MG/0.5ML Pen Inject 5 mg into the skin once a week.   Yes [provider]  pilocarpine (SALAGEN) 5 MG tablet Take 5 mg by mouth daily as needed (Dry mouth).   Yes [provider]  predniSONE (DELTASONE) 5 MG tablet Take 5 mg by mouth daily with breakfast.   Yes [provider]  pregabalin (LYRICA) 100 MG capsule Take 100 mg by mouth 2 (two) times daily.   Yes [provider]  valsartan-hydrochlorothiazide (DIOVAN-HCT) 320-25 MG tablet Take 1 tablet by mouth daily.   Yes [provider]  aspirin EC 81 MG EC tablet Take 1 tablet (81 mg total) by mouth daily. Swallow whole. Patient not taking: Reported on 12/31/2022 05/05/21   [name], MD  benzonatate (TESSALON) 100 MG capsule Take 1 capsule (100 mg total) by mouth 3 (three) times daily as needed for cough. Patient not taking: Reported on 12/31/2022 12/13/21   [name], PA-C  MOUNJARO 2.5 MG/0.5ML Pen Inject 2.5 mg into the skin once a week. Patient not taking: Reported on 12/31/2022 11/14/22   [provider]  promethazine-dextromethorphan (PROMETHAZINE-DM) 6.25-15 MG/5ML syrup Take 5 mLs by mouth 4 (four) times daily as needed for cough. Patient not taking: Reported on 12/31/2022 12/15/21   [name], PA      Allergies    Lisinopril and Tylenol [acetaminophen]    Review of Systems   Review of Systems  Eyes:  Positive for visual disturbance.  Cardiovascular:  Negative for chest pain.  Gastrointestinal:  Negative for abdominal pain.  Neurological:  Positive for speech difficulty and headaches. Negative for weakness and numbness.    Physical Exam Updated Vital Signs BP (!) 134/91   Pulse 72   Temp 98.3 F (36.8 C) (Oral)   Resp 16   Ht '5\' 6"'$  (1.676 m)   Wt 110.1 kg   SpO2 98%   BMI 39.18 kg/m  Physical Exam Vitals and nursing note reviewed.  Constitutional:      Appearance: She is well-developed.  HENT:     Head: Normocephalic and atraumatic.   Cardiovascular:     Rate and Rhythm: Normal rate and regular rhythm.     Heart sounds: Normal heart sounds.  Pulmonary:     Effort: Pulmonary effort is normal.     Breath sounds: Normal breath sounds.  Abdominal:     Palpations: Abdomen is soft.     Tenderness: There is no abdominal tenderness.  Skin:    General: Skin is warm and dry.  Neurological:     Mental Status: She is alert.     Comments: No facial droop.  Patient intermittently has trouble getting the right words out or at least stutters over certain words.  Her tongue movement is normal and there is no facial droop appreciated.  She has equal strength and 5/5 strength in both upper extremities.  Both lower extremities are limited by previous knee replacements and pain though they seem symmetric and she reports normal gross sensation.     ED Results / Procedures / Treatments   Labs (all labs ordered are listed, but only abnormal results are displayed) Labs Reviewed  CBC - Abnormal; Notable for the following components:      Result Value   WBC 3.6 (*)    Hemoglobin 11.8 (*)    All other components within normal limits  COMPREHENSIVE METABOLIC PANEL - Abnormal; Notable for the following components:   Sodium 133 (*)    Calcium 8.5 (*)    Albumin 3.4 (*)    All other components within normal limits  MRSA NEXT GEN BY PCR, NASAL  DIFFERENTIAL  ETHANOL  HIV ANTIBODY (ROUTINE TESTING W REFLEX)  HEMOGLOBIN A1C  PROTIME-INR  APTT  PREGNANCY, URINE  LIPID PANEL  CBG MONITORING, ED  CBG MONITORING, ED    EKG None  Radiology CT ANGIO HEAD NECK W WO CM  Result Date: 12/31/2022 CLINICAL DATA:  Sudden onset stuttering speech, tongue heaviness/numbness, right greater than left base paresthesias EXAM: CT ANGIOGRAPHY HEAD AND NECK TECHNIQUE: Multidetector CT imaging of the head and neck was performed using the standard protocol during bolus administration of intravenous contrast. Multiplanar CT image reconstructions and MIPs  were obtained to evaluate the vascular anatomy. Carotid stenosis measurements (when applicable) are obtained utilizing NASCET criteria, using the distal internal carotid diameter as the denominator. RADIATION DOSE REDUCTION: This exam was performed according to the departmental dose-optimization program which includes automated exposure control, adjustment of the mA and/or kV according to patient size and/or use of iterative reconstruction technique. CONTRAST:  157m OMNIPAQUE IOHEXOL 350 MG/ML SOLN COMPARISON:  No prior CTA available, correlation is made with 12/31/2022 CT head FINDINGS: CT HEAD FINDINGS For noncontrast findings, please see same day CT head. CTA NECK FINDINGS Aortic arch: Standard branching. Imaged portion shows no evidence of aneurysm or dissection. No significant stenosis of the major arch vessel origins. Right carotid system: No evidence of dissection, occlusion, or hemodynamically significant stenosis (greater than  50%). Left carotid system: No evidence of dissection, occlusion, or hemodynamically significant stenosis (greater than 50%). Vertebral arteries: No evidence of dissection, occlusion, or hemodynamically significant stenosis (greater than 50%). Right dominant system. Skeleton: No acute osseous abnormality. Other neck: Negative. Upper chest: No focal pulmonary opacity or pleural effusion. Review of the MIP images confirms the above findings CTA HEAD FINDINGS Anterior circulation: Both internal carotid arteries are patent to the termini, without significant stenosis. A1 segments patent. Normal anterior communicating artery. Anterior cerebral arteries are patent to their distal aspects. No M1 stenosis or occlusion. MCA branches perfused and symmetric. Posterior circulation: Vertebral arteries patent to the vertebrobasilar junction without stenosis, with the left vertebral artery primarily supplying the left PICA. Posterior inferior cerebellar arteries patent proximally. Basilar patent to  its distal aspect. Superior cerebellar arteries patent proximally. Patent P1 segments. PCAs perfused to their distal aspects without stenosis. The bilateral posterior communicating arteries are likely patent but diminutive. Venous sinuses: As permitted by contrast timing, patent. Anatomic variants: None significant. Review of the MIP images confirms the above findings IMPRESSION: 1. No intracranial large vessel occlusion or significant stenosis. 2. No hemodynamically significant stenosis in the neck. Electronically Signed   By: Merilyn Baba M.D.   On: 12/31/2022 19:51   CT HEAD CODE STROKE WO CONTRAST  Result Date: 12/31/2022 CLINICAL DATA:  Code stroke.  Neuro deficit, acute, stroke suspected EXAM: CT HEAD WITHOUT CONTRAST TECHNIQUE: Contiguous axial images were obtained from the base of the skull through the vertex without intravenous contrast. RADIATION DOSE REDUCTION: This exam was performed according to the departmental dose-optimization program which includes automated exposure control, adjustment of the mA and/or kV according to patient size and/or use of iterative reconstruction technique. COMPARISON:  MRI head May 04, 2021.  CT head May 03, 2021. FINDINGS: Brain: No evidence of acute large vascular territory infarction, hemorrhage, hydrocephalus, extra-axial collection or mass lesion/mass effect. Partially empty sella. Vascular: No hyperdense vessel identified. Skull: No acute fracture. Sinuses/Orbits: No acute finding. Other: No mastoid effusions ASPECTS [facility] Stroke Program Early CT Score) total score (0-10 with 10 being normal): 10. IMPRESSION: 1. No evidence of acute intracranial abnormality. 2. ASPECTS is 10. Code stroke imaging results were communicated on 12/31/2022 at 5:29 pm to provider [place] via telephone, who verbally acknowledged these results. Electronically Signed   By: Margaretha Sheffield M.D.   On: 12/31/2022 17:31    Procedures .Critical Care  Performed by: Sherwood Gambler,  MD Authorized by: Sherwood Gambler, MD   Critical care provider statement:    Critical care time (minutes):  30   Critical care time was exclusive of:  Separately billable procedures and treating other patients   Critical care was necessary to treat or prevent imminent or life-threatening deterioration of the following conditions:  CNS failure or compromise   Critical care was time spent personally by me on the following activities:  Development of treatment plan with patient or surrogate, discussions with consultants, evaluation of patient's response to treatment, examination of patient, ordering and review of laboratory studies, ordering and review of radiographic studies, ordering and performing treatments and interventions, pulse oximetry, re-evaluation of patient's condition and review of old charts     Medications Ordered in ED Medications  sodium chloride flush (NS) 0.9 % injection 3 mL (3 mLs Intravenous Not Given 12/31/22 1808)  labetalol (NORMODYNE) injection 5 mg (has no administration in time range)   stroke: early stages of recovery book (has no administration in time range)  0.9 %  sodium chloride infusion ( Intravenous New Bag/Given 12/31/22 2120)  senna-docusate (Senokot-S) tablet 1 tablet (has no administration in time range)  pantoprazole (PROTONIX) injection 40 mg (has no administration in time range)  Oral care mouth rinse (has no administration in time range)  Chlorhexidine Gluconate Cloth 2 % PADS 6 each (has no administration in time range)  tenecteplase (TNKASE) injection for Stroke 25 mg (25 mg Intravenous Given 12/31/22 1754)  iohexol (OMNIPAQUE) 350 MG/ML injection 100 mL (100 mLs Intravenous Contrast Given 12/31/22 1936)    ED Course/ Medical Decision Making/ A&P                             Medical Decision Making Amount and/or Complexity of Data Reviewed Labs: ordered.    Details: Mild anemia but otherwise no significant electrolyte disturbance. Radiology:  ordered and independent interpretation performed.    Details: CT head, no head bleed. ECG/medicine tests: ordered and independent interpretation performed.    Details: No change from baseline.  Risk Decision regarding hospitalization.   Patient presents with strokelike symptoms.  Could be functional versus complicated migraine versus stroke.  She was called a code stroke based on time of onset and symptoms. Dr Curly Shores has decided to give TNK.  Thus she will need admission to the neuro ICU.  She has remained stable in the emergency department since administration of this.  No head bleed.  Will admit to the neuro ICU.        Final Clinical Impression(s) / ED Diagnoses Final diagnoses:  Stroke-like symptoms    Rx / DC Orders ED Discharge Orders     None         Sherwood Gambler, MD 12/31/22 2229

## 2022-12-31 NOTE — ED Notes (Signed)
Dr. Goldston at bedside.  

## 2022-12-31 NOTE — ED Notes (Signed)
Patient transported to CT 

## 2022-12-31 NOTE — Progress Notes (Signed)
Code stroke activated at 1705. Dr Regenia Skeeter at bedside to assess at 1709. At 40 Dr Regenia Skeeter wants neuro paged for stroke alert. [place] paged. Pt to CT at 1717. Dr Curly Shores on screen at 1718. Pt back from CT at 1723.  Linden Dolin, Tele Stroke RN

## 2022-12-31 NOTE — ED Notes (Signed)
IV start unsuccessful x2 RNs. Will not delay CT scan for IV.

## 2023-01-01 ENCOUNTER — Inpatient Hospital Stay (HOSPITAL_COMMUNITY): Payer: 59

## 2023-01-01 DIAGNOSIS — I6300 Cerebral infarction due to thrombosis of unspecified precerebral artery: Secondary | ICD-10-CM | POA: Diagnosis not present

## 2023-01-01 DIAGNOSIS — I6389 Other cerebral infarction: Secondary | ICD-10-CM | POA: Diagnosis not present

## 2023-01-01 LAB — ECHOCARDIOGRAM COMPLETE
Area-P 1/2: 4.06 cm2
Calc EF: 56.8 %
Height: 66 in
S' Lateral: 3.4 cm
Single Plane A2C EF: 57.2 %
Single Plane A4C EF: 55.7 %
Weight: 3883.62 oz

## 2023-01-01 LAB — LIPID PANEL
Cholesterol: 170 mg/dL (ref 0–200)
HDL: 87 mg/dL (ref >40)
LDL Cholesterol: 71 mg/dL (ref 0–99)
Total CHOL/HDL Ratio: 2 RATIO
Triglycerides: 58 mg/dL (ref <150)
VLDL: 12 mg/dL (ref 0–40)

## 2023-01-01 NOTE — Progress Notes (Addendum)
STROKE TEAM PROGRESS NOTE   INTERVAL HISTORY She has had similar symptoms in the past and has been diagnosed with complex migraines. She does follow with a neurologist in [place]. Hemodynamically stable. Neurological symptoms have resolved.  TNK time 0109 MRI scheduled at 1700 Blood pressure adequately controlled.  Neurological exam is nonfocal. Vitals:   01/01/23 0400 01/01/23 0500 01/01/23 0600 01/01/23 0700  BP: (!) 100/54 (!) 98/59 (!) 109/58 (!) 102/59  Pulse: 77 71 70 74  Resp: '20 16 13 19  '$ Temp: 98.1 F (36.7 C)     TempSrc: Oral     SpO2: 99% 96% 96% 99%  Weight:      Height:       CBC:  Recent Labs  Lab 12/31/22 1743  WBC 3.6*  NEUTROABS 1.7  HGB 11.8*  HCT 37.1  MCV 83.6  PLT 323   Basic Metabolic Panel:  Recent Labs  Lab 12/31/22 1743  NA 133*  K 3.6  CL 101  CO2 24  GLUCOSE 97  BUN 9  CREATININE 0.77  CALCIUM 8.5*   Lipid Panel:  Recent Labs  Lab 01/01/23 0511  CHOL 170  TRIG 58  HDL 87  CHOLHDL 2.0  VLDL 12  LDLCALC 71   HgbA1c:  Recent Labs  Lab 12/31/22 2046  HGBA1C 5.6   Urine Drug Screen: No results for input(s): "LABOPIA", "COCAINSCRNUR", "LABBENZ", "AMPHETMU", "THCU", "LABBARB" in the last 168 hours.  Alcohol Level  Recent Labs  Lab 12/31/22 1743  ETH <10    IMAGING past 24 hours CT ANGIO HEAD NECK W WO CM  Result Date: 12/31/2022 CLINICAL DATA:  Sudden onset stuttering speech, tongue heaviness/numbness, right greater than left base paresthesias EXAM: CT ANGIOGRAPHY HEAD AND NECK TECHNIQUE: Multidetector CT imaging of the head and neck was performed using the standard protocol during bolus administration of intravenous contrast. Multiplanar CT image reconstructions and MIPs were obtained to evaluate the vascular anatomy. Carotid stenosis measurements (when applicable) are obtained utilizing NASCET criteria, using the distal internal carotid diameter as the denominator. RADIATION DOSE REDUCTION: This exam was performed according  to the departmental dose-optimization program which includes automated exposure control, adjustment of the mA and/or kV according to patient size and/or use of iterative reconstruction technique. CONTRAST:  151m OMNIPAQUE IOHEXOL 350 MG/ML SOLN COMPARISON:  No prior CTA available, correlation is made with 12/31/2022 CT head FINDINGS: CT HEAD FINDINGS For noncontrast findings, please see same day CT head. CTA NECK FINDINGS Aortic arch: Standard branching. Imaged portion shows no evidence of aneurysm or dissection. No significant stenosis of the major arch vessel origins. Right carotid system: No evidence of dissection, occlusion, or hemodynamically significant stenosis (greater than 50%). Left carotid system: No evidence of dissection, occlusion, or hemodynamically significant stenosis (greater than 50%). Vertebral arteries: No evidence of dissection, occlusion, or hemodynamically significant stenosis (greater than 50%). Right dominant system. Skeleton: No acute osseous abnormality. Other neck: Negative. Upper chest: No focal pulmonary opacity or pleural effusion. Review of the MIP images confirms the above findings CTA HEAD FINDINGS Anterior circulation: Both internal carotid arteries are patent to the termini, without significant stenosis. A1 segments patent. Normal anterior communicating artery. Anterior cerebral arteries are patent to their distal aspects. No M1 stenosis or occlusion. MCA branches perfused and symmetric. Posterior circulation: Vertebral arteries patent to the vertebrobasilar junction without stenosis, with the left vertebral artery primarily supplying the left PICA. Posterior inferior cerebellar arteries patent proximally. Basilar patent to its distal aspect. Superior cerebellar arteries patent proximally. Patent  P1 segments. PCAs perfused to their distal aspects without stenosis. The bilateral posterior communicating arteries are likely patent but diminutive. Venous sinuses: As permitted by  contrast timing, patent. Anatomic variants: None significant. Review of the MIP images confirms the above findings IMPRESSION: 1. No intracranial large vessel occlusion or significant stenosis. 2. No hemodynamically significant stenosis in the neck. Electronically Signed   By: Merilyn Baba M.D.   On: 12/31/2022 19:51   CT HEAD CODE STROKE WO CONTRAST  Result Date: 12/31/2022 CLINICAL DATA:  Code stroke.  Neuro deficit, acute, stroke suspected EXAM: CT HEAD WITHOUT CONTRAST TECHNIQUE: Contiguous axial images were obtained from the base of the skull through the vertex without intravenous contrast. RADIATION DOSE REDUCTION: This exam was performed according to the departmental dose-optimization program which includes automated exposure control, adjustment of the mA and/or kV according to patient size and/or use of iterative reconstruction technique. COMPARISON:  MRI head May 04, 2021.  CT head May 03, 2021. FINDINGS: Brain: No evidence of acute large vascular territory infarction, hemorrhage, hydrocephalus, extra-axial collection or mass lesion/mass effect. Partially empty sella. Vascular: No hyperdense vessel identified. Skull: No acute fracture. Sinuses/Orbits: No acute finding. Other: No mastoid effusions ASPECTS [facility] Stroke Program Early CT Score) total score (0-10 with 10 being normal): 10. IMPRESSION: 1. No evidence of acute intracranial abnormality. 2. ASPECTS is 10. Code stroke imaging results were communicated on 12/31/2022 at 5:29 pm to provider Hendricks via telephone, who verbally acknowledged these results. Electronically Signed   By: Margaretha Sheffield M.D.   On: 12/31/2022 17:31    PHYSICAL EXAM  Physical Exam  Constitutional: Appears well-developed and well-nourished pleasant middle-age Caucasian lady.   Cardiovascular: Normal rate and regular rhythm.  Respiratory: Effort normal, non-labored breathing  Neuro: Mental Status: Patient is awake, alert, oriented to person, place, month,  year, and situation. Patient is able to give a clear and coherent history. No signs of aphasia or neglect Cranial Nerves: II: Visual Fields are full. Pupils are equal, round, and reactive to light.   III,IV, VI: EOMI without ptosis or diploplia.  V: Facial sensation is symmetric to temperature VII: Facial movement is symmetric resting and smiling VIII: Hearing is intact to voice X: Palate elevates symmetrically XI: Shoulder shrug is symmetric. XII: Tongue protrudes midline without atrophy or fasciculations.  Motor: Tone is normal. Bulk is normal. 5/5 strength was present in all four extremities.  Sensory: Diminished sensation on left leg Cerebellar: FNF and HKS are intact bilaterally     ASSESSMENT/PLAN Ms. Michelle Nash is a 57 y.o. female with history of hypertension, possible diabetes, TIA vs. Complex migraine, SLE, Sjogren's, osteoarthritis, obesity, fibromyalgia, depression who initially presented with stuttering speech, tongue heaviness, parasthesias, and left sided headache. She has been diagnosed with TIA vs complex migraine in the past.    Suspect stroke vs complicated migraine vs functional neurological weakness s/p TNK  Code Stroke CT head No acute abnormality. ASPECTS 10.    CTA head & neck No LVO MRI   2D Echo EF 50-55% LDL 71 HgbA1c 5.6 VTE prophylaxis - SCDs    Diet   Diet Heart Room service appropriate? Yes; Fluid consistency: Thin   No antithrombotic prior to admission, now on No antithrombotic. No antithrombotic until 24 hours post TNK Therapy recommendations:  No follow up recommended Disposition:  Pending   Hypertension Home meds:  Diovan-HCT Stable Permissive hypertension (OK if < 220/120) but gradually normalize in 5-7 days Long-term BP goal normotensive  Hyperlipidemia LDL 71, goal <  70  Diabetes type II Controlled Home meds:  Mounjaro HgbA1c 5.6, goal < 7.0 CBGs Recent Labs    12/31/22 1705  GLUCAP 95    SSI  Other Stroke Risk  Factors  Obesity, Body mass index is 39.18 kg/m., BMI >/= 30 associated with increased stroke risk, recommend weight loss, diet and exercise as appropriate  Hx stroke/TIA Previously seen for TIA vs complex migraine  Migraines Amiovig No longer on topamax Neurologist is at [facility]- Dr. Mathews Robinsons  Other Active Problems Lupus Sjogren's Disease Fibromyalgia  Home med: plaquenil Follows with Rheumatology at [facility]- Dr. [facility] day # 1  Patient seen and examined by NP/APP with MD. MD to update note as needed.   Janine Ores, DNP, FNP-BC [facility] Pager: [phone]  STROKE MD NOTE :  I have personally obtained history,examined this patient, reviewed notes, independently viewed imaging studies, participated in medical decision making and plan of care.ROS completed by me personally and pertinent positives fully documented  I have made any additions or clarifications directly to the above note. Agree with note above.  Patient presented with sudden onset of tongue heaviness and speech difficulties and facial numbness followed by headache.  Possibly complicated migraine episode given multiple prior similar episodes in the past.  She received IV TNK and needs close neurological monitoring with strict control of blood pressure as per postthrombolytic protocol.  Check MRI scan of the brain.  Mobilize out of bed.  Physical and Occupational Therapy consults.  Long discussion with the patient and answered questions.This patient is critically ill and at significant risk of neurological worsening, death and care requires constant monitoring of vital signs, hemodynamics,respiratory and cardiac monitoring, extensive review of multiple databases, frequent neurological assessment, discussion with family, other specialists and medical decision making of high complexity.I have made any additions or clarifications directly to the above note.This critical care time does not reflect procedure time,  or teaching time or supervisory time of PA/NP/Med Resident etc but could involve care discussion time.  I spent 30 minutes of neurocritical care time  in the care of  this patient.      Antony Contras, MD Medical Director [facility] Pager: [phone] 01/01/2023 5:08 PM   To contact Stroke Continuity provider, please refer to [email or web address] After hours, contact General Neurology

## 2023-01-01 NOTE — Evaluation (Signed)
Occupational Therapy Evaluation Patient Details Name: Michelle Nash MRN: [id number] DOB: Dec 10, 1966 Today's Date: 01/01/2023   History of Present Illness 57 yo female presents to ED on 1/16 with stuttering speech, tongue numbness and R face paresthesias NIHSS 6 PMH: TIA( blurry vision, AMS) fibromyalgia, lupus, OA, DVT/PE, sjogren's disease, TIA.   Clinical Impression   Patient evaluated by Occupational Therapy with no further acute OT needs identified. All education has been completed and the patient has no further questions. See below for any follow-up Occupational Therapy or equipment needs. OT to sign off. Thank you for referral.   Pt has 3 daughters that can (A) patient. Pt is pending a dog Elyse Hsu but its a family dog that does not require care at this time.      Recommendations for follow up therapy are one component of a multi-disciplinary discharge planning process, led by the attending physician.  Recommendations may be updated based on patient status, additional functional criteria and insurance authorization.   Follow Up Recommendations  No OT follow up     Assistance Recommended at Discharge None  Patient can return home with the following      Functional Status Assessment  Patient has had a recent decline in their functional status and demonstrates the ability to make significant improvements in function in a reasonable and predictable amount of time.  Equipment Recommendations       Recommendations for Other Services       Precautions / Restrictions Precautions Precautions: None      Mobility Bed Mobility Overal bed mobility: Independent                  Transfers Overall transfer level: Independent                        Balance Overall balance assessment: No apparent balance deficits (not formally assessed)                                         ADL either performed or assessed with clinical judgement   ADL Overall  ADL's : Modified independent                                       General ADL Comments: demonstrated LOB dressing applying underwear, demonstrates tub transfer, pathfinding in the hall and bed mobility     Vision Baseline Vision/History: 1 Wears glasses Patient Visual Report: No change from baseline       Perception     Praxis      Pertinent Vitals/Pain Pain Assessment Pain Assessment: No/denies pain     Hand Dominance Right   Extremity/Trunk Assessment Upper Extremity Assessment Upper Extremity Assessment: Overall WFL for tasks assessed   Lower Extremity Assessment Lower Extremity Assessment: Overall WFL for tasks assessed   Cervical / Trunk Assessment Cervical / Trunk Assessment: Normal   Communication Communication Communication: No difficulties   Cognition Arousal/Alertness: Awake/alert Behavior During Therapy: WFL for tasks assessed/performed Overall Cognitive Status: Within Functional Limits for tasks assessed                                       General Comments  VSS BP 113/54 after movement  Exercises     Shoulder Instructions      Home Living Family/patient expects to be discharged to:: Private residence Living Arrangements: Alone Available Help at Discharge: Available PRN/intermittently (daughters would help if need) Type of Home: Apartment Home Access: Stairs to enter [employer] of Steps: flight (18) Entrance Stairs-Rails: Left Home Layout: One level     Bathroom Shower/Tub: Teacher, early years/pre: Standard     Home Equipment: Conservation officer, nature (2 wheels);Cane - single point;BSC/3in1   Additional Comments: no animals- has a dog in [place] with family member. Pt states she was suppose to pick the dog up 1/18 to have her back      Prior Functioning/Environment Prior Level of Function : Independent/Modified Independent;Driving                        OT Problem List:         OT Treatment/Interventions:      OT Goals(Current goals can be found in the care plan section) Acute Rehab OT Goals Patient Stated Goal: to be able to get dog Bella by next week maybe pending what hte doctors say to patient  OT Frequency:      Co-evaluation              AM-PAC OT "6 Clicks" Daily Activity     Outcome Measure Help from another person eating meals?: None Help from another person taking care of personal grooming?: None Help from another person toileting, which includes using toliet, bedpan, or urinal?: None Help from another person bathing (including washing, rinsing, drying)?: None Help from another person to put on and taking off regular upper body clothing?: None Help from another person to put on and taking off regular lower body clothing?: None 6 Click Score: 24   End of Session Nurse Communication: Mobility status  Activity Tolerance: Patient tolerated treatment well Patient left: in bed;with call bell/phone within reach;with bed alarm set  OT Visit Diagnosis: Unsteadiness on feet (R26.81)                Time: 1017-5102 OT Time Calculation (min): 19 min Charges:  OT General Charges $OT Visit: 1 Visit OT Evaluation $OT Eval Low Complexity: 1 Low   Brynn, OTR/L  Acute Rehabilitation Services Office: [phone] .   Jeri Modena 01/01/2023, 11:45 AM

## 2023-01-01 NOTE — TOC CAGE-AID Note (Signed)
Transition of Care [facility]) - CAGE-AID Screening   Patient Details  Name: Michelle Nash MRN: [id number] Date of Birth: 01-14-1966  [facility] CM/SW Contact:    Bethann Berkshire, [place] Phone Number: 01/01/2023, 11:39 AM   Clinical Narrative:  Pt reports she does not use alcohol or any other substances.   CAGE-AID Screening:    Have You Ever Felt You Ought to Cut Down on Your Drinking or Drug Use?: No Have People Annoyed You By Critizing Your Drinking Or Drug Use?: No Have You Felt Bad Or Guilty About Your Drinking Or Drug Use?: No Have You Ever Had a Drink or Used Drugs First Thing In The Morning to Steady Your Nerves or to Get Rid of a Hangover?: No CAGE-AID Score: 0

## 2023-01-01 NOTE — Progress Notes (Signed)
Echocardiogram 2D Echocardiogram has been performed.  Michelle Nash 01/01/2023, 9:21 AM

## 2023-01-01 NOTE — Evaluation (Signed)
Physical Therapy Evaluation Patient Details Name: Michelle Nash MRN: [id number] DOB: 06/01/1966 Today's Date: 01/01/2023  History of Present Illness  57 yo female presents to ED on 1/16 with stuttering speech, tongue numbness and R face paresthesias NIHSS 6 PMH: TIA( blurry vision, AMS) fibromyalgia, lupus, OA, DVT/PE, sjogren's disease, TIA.  Clinical Impression  Pt is at or close to baseline functioning and should be safe at home . There are no further acute PT needs.  Will sign off at this time.        Recommendations for follow up therapy are one component of a multi-disciplinary discharge planning process, led by the attending physician.  Recommendations may be updated based on patient status, additional functional criteria and insurance authorization.  Follow Up Recommendations No PT follow up      Assistance Recommended at Discharge PRN  Patient can return home with the following       Equipment Recommendations None recommended by PT  Recommendations for Other Services       Functional Status Assessment Patient has had a recent decline in their functional status and demonstrates the ability to make significant improvements in function in a reasonable and predictable amount of time.     Precautions / Restrictions Precautions Precautions: None      Mobility  Bed Mobility Overal bed mobility: Independent                  Transfers Overall transfer level: Independent                      Ambulation/Gait Ambulation/Gait assistance: Supervision Gait Distance (Feet): 260 Feet Assistive device: None Gait Pattern/deviations: Step-through pattern   Gait velocity interpretation: 1.31 - 2.62 ft/sec, indicative of limited community ambulator   General Gait Details: mild unsteadiness initially with noticeable improve with time up. pt still not able to advance to age appropriate gait speeds, but able to manage pulls and tugs that a little dog might do during  a walk.  No overt balance deviation and no LOB.  Stairs Stairs: Yes Stairs assistance: Supervision, Modified independent (Device/Increase time) Stair Management: One rail Left, Alternating pattern, Forwards Number of Stairs: 12 General stair comments: safe with a rail and able to manage pulls, tugs and distraction that a little dog might make.  Wheelchair Mobility    Modified Rankin (Stroke Patients Only)       Balance                                             Pertinent Vitals/Pain Pain Assessment Pain Assessment: No/denies pain    Home Living Family/patient expects to be discharged to:: Private residence Living Arrangements: Alone Available Help at Discharge: Available PRN/intermittently (daughters would help if need) Type of Home: Apartment Home Access: Stairs to enter Entrance Stairs-Rails: Left Entrance Stairs-Number of Steps: flight (18)   Home Layout: One level Home Equipment: Conservation officer, nature (2 wheels);Cane - single point;BSC/3in1 Additional Comments: no animals- has a dog in [place] with family member. Pt states she was suppose to pick the dog up 1/18 to have her back    Prior Function Prior Level of Function : Independent/Modified Independent;Driving                     Hand Dominance   Dominant Hand: Right    Extremity/Trunk Assessment   Upper  Extremity Assessment Upper Extremity Assessment: Overall WFL for tasks assessed    Lower Extremity Assessment Lower Extremity Assessment: Overall WFL for tasks assessed    Cervical / Trunk Assessment Cervical / Trunk Assessment: Normal  Communication   Communication: No difficulties  Cognition Arousal/Alertness: Awake/alert Behavior During Therapy: WFL for tasks assessed/performed Overall Cognitive Status: Within Functional Limits for tasks assessed                                          General Comments General comments (skin integrity, edema, etc.):  vss    Exercises     Assessment/Plan    PT Assessment Patient does not need any further PT services  PT Problem List         PT Treatment Interventions      PT Goals (Current goals can be found in the Care Plan section)  Acute Rehab PT Goals PT Goal Formulation: All assessment and education complete, DC therapy    Frequency       Co-evaluation               AM-PAC PT "6 Clicks" Mobility  Outcome Measure Help needed turning from your back to your side while in a flat bed without using bedrails?: None Help needed moving from lying on your back to sitting on the side of a flat bed without using bedrails?: None Help needed moving to and from a bed to a chair (including a wheelchair)?: None Help needed standing up from a chair using your arms (e.g., wheelchair or bedside chair)?: None Help needed to walk in hospital room?: A Little Help needed climbing 3-5 steps with a railing? : A Little 6 Click Score: 22    End of Session   Activity Tolerance: Patient tolerated treatment well Patient left: in bed;with call bell/phone within reach Nurse Communication: Mobility status PT Visit Diagnosis: Other symptoms and signs involving the nervous system (R29.898)    Time: 9476-5465 PT Time Calculation (min) (ACUTE ONLY): 17 min   Charges:   PT Evaluation $PT Eval Low Complexity: 1 Low          01/01/2023  Ginger Carne., PT Acute Rehabilitation Services [phone]  (office)  Tessie Fass Tyshan Enderle 01/01/2023, 1:58 PM

## 2023-01-02 DIAGNOSIS — G43009 Migraine without aura, not intractable, without status migrainosus: Secondary | ICD-10-CM

## 2023-01-02 DIAGNOSIS — G43909 Migraine, unspecified, not intractable, without status migrainosus: Secondary | ICD-10-CM | POA: Insufficient documentation

## 2023-01-02 MED ORDER — ASPIRIN 81 MG PO TBEC: 81.0000 mg | DELAYED_RELEASE_TABLET | Freq: Every day | ORAL | Status: DC

## 2023-01-02 NOTE — Discharge Summary (Addendum)
Stroke Discharge Summary  Patient ID: Michelle Nash   MRN: [id number]      DOB: 03-07-1966  Date of Admission: 12/31/2022 Date of Discharge: 01/02/2023  Attending Physician:  Antony Contras MD Consultant(s):   None Patient's PCP:  No primary care provider on file.  DISCHARGE DIAGNOSIS:  Active Problems:   Stroke-like symptom Complicated migraine   Allergies as of 01/02/2023       Reactions   Lisinopril Cough   ACE cough   Tylenol [acetaminophen] Nausea And Vomiting        Medication List     TAKE these medications    Aimovig 140 MG/ML Soaj Generic drug: Erenumab-aooe Inject 140 mg into the skin every 30 (thirty) days.   aspirin EC 81 MG tablet Take 1 tablet (81 mg total) by mouth daily. Swallow whole.   benzonatate 100 MG capsule Commonly known as: TESSALON Take 1 capsule (100 mg total) by mouth 3 (three) times daily as needed for cough.   cyclobenzaprine 10 MG tablet Commonly known as: FLEXERIL Take 1 tablet by mouth at bedtime.   diltiazem 180 MG 24 hr capsule Commonly known as: CARDIZEM CD Take 180 mg by mouth every evening.   DULoxetine 30 MG capsule Commonly known as: CYMBALTA Take 30 mg by mouth 2 (two) times daily.   hydroxychloroquine 200 MG tablet Commonly known as: PLAQUENIL Take 200 mg by mouth 2 (two) times daily.   Mounjaro 5 MG/0.5ML Pen Generic drug: tirzepatide Inject 5 mg into the skin once a week.   Mounjaro 2.5 MG/0.5ML Pen Generic drug: tirzepatide Inject 2.5 mg into the skin once a week.   pilocarpine 5 MG tablet Commonly known as: SALAGEN Take 5 mg by mouth daily as needed (Dry mouth).   predniSONE 5 MG tablet Commonly known as: DELTASONE Take 5 mg by mouth daily with breakfast.   pregabalin 100 MG capsule Commonly known as: LYRICA Take 100 mg by mouth 2 (two) times daily.   promethazine-dextromethorphan 6.25-15 MG/5ML syrup Commonly known as: PROMETHAZINE-DM Take 5 mLs by mouth 4 (four) times daily as needed for  cough.   Restasis 0.05 % ophthalmic emulsion Generic drug: cycloSPORINE Place 1 drop into both eyes 2 (two) times daily.   valsartan-hydrochlorothiazide 320-25 MG tablet Commonly known as: DIOVAN-HCT Take 1 tablet by mouth daily.        LABORATORY STUDIES CBC    Component Value Date/Time   WBC 3.6 (L) 12/31/2022 1743   RBC 4.44 12/31/2022 1743   HGB 11.8 (L) 12/31/2022 1743   HCT 37.1 12/31/2022 1743   PLT 252 12/31/2022 1743   MCV 83.6 12/31/2022 1743   MCH 26.6 12/31/2022 1743   MCHC 31.8 12/31/2022 1743   RDW 14.8 12/31/2022 1743   LYMPHSABS 1.7 12/31/2022 1743   MONOABS 0.2 12/31/2022 1743   EOSABS 0.0 12/31/2022 1743   BASOSABS 0.0 12/31/2022 1743   CMP    Component Value Date/Time   NA 133 (L) 12/31/2022 1743   K 3.6 12/31/2022 1743   CL 101 12/31/2022 1743   CO2 24 12/31/2022 1743   GLUCOSE 97 12/31/2022 1743   BUN 9 12/31/2022 1743   CREATININE 0.77 12/31/2022 1743   CALCIUM 8.5 (L) 12/31/2022 1743   PROT 7.0 12/31/2022 1743   ALBUMIN 3.4 (L) 12/31/2022 1743   AST 20 12/31/2022 1743   ALT 12 12/31/2022 1743   ALKPHOS 66 12/31/2022 1743   BILITOT 0.3 12/31/2022 1743   GFRNONAA >60 12/31/2022 1743  COAGS Lab Results  Component Value Date   INR 0.9 12/31/2022   Lipid Panel    Component Value Date/Time   CHOL 170 01/01/2023 0511   TRIG 58 01/01/2023 0511   HDL 87 01/01/2023 0511   CHOLHDL 2.0 01/01/2023 0511   VLDL 12 01/01/2023 0511   LDLCALC 71 01/01/2023 0511   HgbA1C  Lab Results  Component Value Date   HGBA1C 5.6 12/31/2022   Urinalysis    Component Value Date/Time   COLORURINE YELLOW 10/17/2021 [place] 10/17/2021 1347   LABSPEC 1.020 10/17/2021 1347   PHURINE 6.5 10/17/2021 1347   GLUCOSEU NEGATIVE 10/17/2021 1347   HGBUR NEGATIVE 10/17/2021 1347   BILIRUBINUR NEGATIVE 10/17/2021 [place] 10/17/2021 1347   PROTEINUR NEGATIVE 10/17/2021 1347   NITRITE NEGATIVE 10/17/2021 1347    LEUKOCYTESUR NEGATIVE 10/17/2021 1347   Urine Drug Screen No results found for: "LABOPIA", "COCAINSCRNUR", "LABBENZ", "AMPHETMU", "THCU", "LABBARB"  Alcohol Level    Component Value Date/Time   ETH <10 12/31/2022 1743     SIGNIFICANT DIAGNOSTIC STUDIES MR BRAIN WO CONTRAST  Result Date: 01/01/2023 CLINICAL DATA:  Stroke, follow up s/p TNK (24 hours at 5:47 PM) EXAM: MRI HEAD WITHOUT CONTRAST TECHNIQUE: Multiplanar, multiecho pulse sequences of the brain and surrounding structures were obtained without intravenous contrast. COMPARISON:  CTA head/neck 12/31/2022. FINDINGS: Brain: No acute infarction, hemorrhage, hydrocephalus, extra-axial collection or mass lesion. Mild scattered T2/FLAIR hyperintensities in the white matter, nonspecific but compatible with chronic microvascular ischemic disease or the sequela of chronic migraines. Partially empty sella. Vascular: Major arterial voids are maintained at the skull base. Skull and upper cervical spine: Normal marrow signal. Sinuses/Orbits: Clear sinuses.  No acute orbital findings. Other: No mastoid effusions. IMPRESSION: No evidence of acute intracranial abnormality. Electronically Signed   By: Margaretha Sheffield M.D.   On: 01/01/2023 17:32   ECHOCARDIOGRAM COMPLETE  Result Date: 01/01/2023    ECHOCARDIOGRAM REPORT   Patient Name:   Michelle Nash Date of Exam: 01/01/2023 Medical Rec #:  [id number]     Height:       66.0 in Accession #:    [id number]    Weight:       242.7 lb Date of Birth:  07/21/1966     BSA:          2.171 m Patient Age:    57 years      BP:           119/73 mmHg Patient Gender: F             HR:           72 bpm. Exam Location:  Inpatient Procedure: 2D Echo, Cardiac Doppler and Color Doppler Indications:    stroke  History:        Patient has prior history of Echocardiogram examinations.  Sonographer:    Phineas Douglas Referring Phys: [id number] [place]  1. Left ventricular ejection fraction, by estimation, is 50 to  55%. The left ventricle has low normal function. The left ventricle has no regional wall motion abnormalities. Left ventricular diastolic parameters are consistent with Grade I diastolic dysfunction (impaired relaxation).  2. Right ventricular systolic function is normal. The right ventricular size is normal.  3. The mitral valve is normal in structure. Trivial mitral valve regurgitation. No evidence of mitral stenosis.  4. The aortic valve is tricuspid. Aortic valve regurgitation is not visualized. No aortic stenosis is present.  5. The inferior vena  cava is normal in size with greater than 50% respiratory variability, suggesting right atrial pressure of 3 mmHg. Comparison(s): No significant change from prior study. FINDINGS  Left Ventricle: Left ventricular ejection fraction, by estimation, is 50 to 55%. The left ventricle has low normal function. The left ventricle has no regional wall motion abnormalities. The left ventricular internal cavity size was normal in size. There is no left ventricular hypertrophy. Left ventricular diastolic parameters are consistent with Grade I diastolic dysfunction (impaired relaxation). Right Ventricle: The right ventricular size is normal. Right ventricular systolic function is normal. Left Atrium: Left atrial size was normal in size. Right Atrium: Right atrial size was normal in size. Pericardium: There is no evidence of pericardial effusion. Mitral Valve: The mitral valve is normal in structure. Trivial mitral valve regurgitation. No evidence of mitral valve stenosis. Tricuspid Valve: The tricuspid valve is normal in structure. Tricuspid valve regurgitation is trivial. No evidence of tricuspid stenosis. Aortic Valve: The aortic valve is tricuspid. Aortic valve regurgitation is not visualized. No aortic stenosis is present. Pulmonic Valve: The pulmonic valve was normal in structure. Pulmonic valve regurgitation is not visualized. No evidence of pulmonic stenosis. Aorta: The  aortic root is normal in size and structure. Venous: The inferior vena cava is normal in size with greater than 50% respiratory variability, suggesting right atrial pressure of 3 mmHg. IAS/Shunts: No atrial level shunt detected by color flow Doppler.  LEFT VENTRICLE PLAX 2D LVIDd:         5.20 cm      Diastology LVIDs:         3.40 cm      LV e' medial:    7.72 cm/s LV PW:         1.00 cm      LV E/e' medial:  7.8 LV IVS:        1.00 cm      LV e' lateral:   12.30 cm/s LVOT diam:     1.90 cm      LV E/e' lateral: 4.9 LV SV:         49 LV SV Index:   22 LVOT Area:     2.84 cm  LV Volumes (MOD) LV vol d, MOD A2C: 114.0 ml LV vol d, MOD A4C: 107.0 ml LV vol s, MOD A2C: 48.8 ml LV vol s, MOD A4C: 47.4 ml LV SV MOD A2C:     65.2 ml LV SV MOD A4C:     107.0 ml LV SV MOD BP:      64.9 ml RIGHT VENTRICLE             IVC RV Basal diam:  3.60 cm     IVC diam: 1.50 cm RV S prime:     11.30 cm/s TAPSE (M-mode): 2.2 cm LEFT ATRIUM             Index        RIGHT ATRIUM           Index LA diam:        3.60 cm 1.66 cm/m   RA Area:     14.50 cm LA Vol (A2C):   48.4 ml 22.29 ml/m  RA Volume:   34.20 ml  15.75 ml/m LA Vol (A4C):   54.4 ml 25.05 ml/m LA Biplane Vol: 53.4 ml 24.59 ml/m  AORTIC VALVE LVOT Vmax:   90.30 cm/s LVOT Vmean:  58.300 cm/s LVOT VTI:    0.172 m  AORTA Ao Root diam:  2.80 cm Ao Asc diam:  3.10 cm MITRAL VALVE MV Area (PHT): 4.06 cm    SHUNTS MV Decel Time: 187 msec    Systemic VTI:  0.17 m MV E velocity: 59.90 cm/s  Systemic Diam: 1.90 cm MV A velocity: 68.60 cm/s MV E/A ratio:  0.87 Kirk Ruths MD Electronically signed by Kirk Ruths MD Signature Date/Time: 01/01/2023/12:03:50 PM    Final    CT ANGIO HEAD NECK W WO CM  Result Date: 12/31/2022 CLINICAL DATA:  Sudden onset stuttering speech, tongue heaviness/numbness, right greater than left base paresthesias EXAM: CT ANGIOGRAPHY HEAD AND NECK TECHNIQUE: Multidetector CT imaging of the head and neck was performed using the standard protocol during  bolus administration of intravenous contrast. Multiplanar CT image reconstructions and MIPs were obtained to evaluate the vascular anatomy. Carotid stenosis measurements (when applicable) are obtained utilizing NASCET criteria, using the distal internal carotid diameter as the denominator. RADIATION DOSE REDUCTION: This exam was performed according to the departmental dose-optimization program which includes automated exposure control, adjustment of the mA and/or kV according to patient size and/or use of iterative reconstruction technique. CONTRAST:  161m OMNIPAQUE IOHEXOL 350 MG/ML SOLN COMPARISON:  No prior CTA available, correlation is made with 12/31/2022 CT head FINDINGS: CT HEAD FINDINGS For noncontrast findings, please see same day CT head. CTA NECK FINDINGS Aortic arch: Standard branching. Imaged portion shows no evidence of aneurysm or dissection. No significant stenosis of the major arch vessel origins. Right carotid system: No evidence of dissection, occlusion, or hemodynamically significant stenosis (greater than 50%). Left carotid system: No evidence of dissection, occlusion, or hemodynamically significant stenosis (greater than 50%). Vertebral arteries: No evidence of dissection, occlusion, or hemodynamically significant stenosis (greater than 50%). Right dominant system. Skeleton: No acute osseous abnormality. Other neck: Negative. Upper chest: No focal pulmonary opacity or pleural effusion. Review of the MIP images confirms the above findings CTA HEAD FINDINGS Anterior circulation: Both internal carotid arteries are patent to the termini, without significant stenosis. A1 segments patent. Normal anterior communicating artery. Anterior cerebral arteries are patent to their distal aspects. No M1 stenosis or occlusion. MCA branches perfused and symmetric. Posterior circulation: Vertebral arteries patent to the vertebrobasilar junction without stenosis, with the left vertebral artery primarily supplying  the left PICA. Posterior inferior cerebellar arteries patent proximally. Basilar patent to its distal aspect. Superior cerebellar arteries patent proximally. Patent P1 segments. PCAs perfused to their distal aspects without stenosis. The bilateral posterior communicating arteries are likely patent but diminutive. Venous sinuses: As permitted by contrast timing, patent. Anatomic variants: None significant. Review of the MIP images confirms the above findings IMPRESSION: 1. No intracranial large vessel occlusion or significant stenosis. 2. No hemodynamically significant stenosis in the neck. Electronically Signed   By: AMerilyn BabaM.D.   On: 12/31/2022 19:51   CT HEAD CODE STROKE WO CONTRAST  Result Date: 12/31/2022 CLINICAL DATA:  Code stroke.  Neuro deficit, acute, stroke suspected EXAM: CT HEAD WITHOUT CONTRAST TECHNIQUE: Contiguous axial images were obtained from the base of the skull through the vertex without intravenous contrast. RADIATION DOSE REDUCTION: This exam was performed according to the departmental dose-optimization program which includes automated exposure control, adjustment of the mA and/or kV according to patient size and/or use of iterative reconstruction technique. COMPARISON:  MRI head May 04, 2021.  CT head May 03, 2021. FINDINGS: Brain: No evidence of acute large vascular territory infarction, hemorrhage, hydrocephalus, extra-axial collection or mass lesion/mass effect. Partially empty sella. Vascular: No hyperdense vessel identified. Skull:  No acute fracture. Sinuses/Orbits: No acute finding. Other: No mastoid effusions ASPECTS [facility] Stroke Program Early CT Score) total score (0-10 with 10 being normal): 10. IMPRESSION: 1. No evidence of acute intracranial abnormality. 2. ASPECTS is 10. Code stroke imaging results were communicated on 12/31/2022 at 5:29 pm to provider [place] via telephone, who verbally acknowledged these results. Electronically Signed   By: Margaretha Sheffield M.D.    On: 12/31/2022 17:31      HISTORY OF PRESENT ILLNESS Michelle Nash is a 57 y.o. female with history of hypertension, possible diabetes, TIA vs. Complex migraine, SLE, Sjogren's, osteoarthritis, obesity, fibromyalgia, depression who initially presented with stuttering speech, tongue heaviness, parasthesias, and left sided headache. She has been diagnosed with TIA vs complex migraine in the past. Seen as a tele stroke and received TNK. Transferred to [facility] for post TNK care and stroke work up.   HOSPITAL COURSE Code Stroke CT head No acute abnormality. ASPECTS 10.    CTA head & neck No LVO MRI  - no acute abnormality 2D Echo EF 50-55% LDL 71 HgbA1c 5.6 VTE prophylaxis - SCDs       Diet    Diet Heart Room service appropriate? Yes; Fluid consistency: Thin        No antithrombotic prior to admission, now on No antithrombotic. No antithrombotic until 24 hours post TNK Therapy recommendations:  No follow up recommended Disposition:  Home    Hypertension Home meds:  Diovan-HCT Stable Permissive hypertension (OK if < 220/120) but gradually normalize in 5-7 days Long-term BP goal normotensive   Hyperlipidemia LDL 71, goal < 70   Diabetes type II Controlled Home meds:  Mounjaro HgbA1c 5.6, goal < 7.0 CBGs Recent Labs (last 2 labs)     Recent Labs    12/31/22 1705  GLUCAP 95      SSI   Other Stroke Risk Factors  Obesity, Body mass index is 39.18 kg/m., BMI >/= 30 associated with increased stroke risk, recommend weight loss, diet and exercise as appropriate  Hx stroke/TIA Previously seen for TIA vs complex migraine  Migraines Amiovig No longer on topamax Neurologist is at [facility]- Dr. Mathews Robinsons   Other Active Problems Lupus Sjogren's Disease Fibromyalgia  Home med: plaquenil Follows with Rheumatology at [facility]- Dr. Paulla Fore    DISCHARGE EXAM Blood pressure 119/66, pulse 79, temperature 98.5 F (36.9 C), temperature source Oral, resp. rate 20, height '5\' 6"'$  (1.676 m),  weight 110.1 kg, SpO2 99 %. Constitutional: Appears well-developed and well-nourished pleasant middle-age Caucasian lady.   Cardiovascular: Normal rate and regular rhythm.  Respiratory: Effort normal, non-labored breathing   Neuro: Mental Status: Patient is awake, alert, oriented to person, place, month, year, and situation. Patient is able to give a clear and coherent history. No signs of aphasia or neglect Cranial Nerves: II: Visual Fields are full. Pupils are equal, round, and reactive to light.   III,IV, VI: EOMI without ptosis or diploplia.  V: Facial sensation is symmetric to temperature VII: Facial movement is symmetric resting and smiling VIII: Hearing is intact to voice X: Palate elevates symmetrically XI: Shoulder shrug is symmetric. XII: Tongue protrudes midline without atrophy or fasciculations.  Motor: Tone is normal. Bulk is normal. 5/5 strength was present in all four extremities.  Sensory: Diminished sensation on left leg Cerebellar: FNF and HKS are intact bilaterally  Discharge Diet       Diet   Diet Heart Room service appropriate? Yes; Fluid consistency: Thin   liquids  DISCHARGE PLAN  Disposition:  Home  aspirin 81 mg daily for secondary stroke prevention  Ongoing stroke risk factor control by Primary Care Physician at time of discharge Follow-up PCP No primary care provider on file. in 2 weeks. Follow-up in [facility] in 8 weeks with headache specialist Dr Jaynee Eagles or Billey Gosling, office to schedule an appointment.   32 minutes were spent preparing discharge.  Patient seen and examined by NP/APP with MD. MD to update note as needed.   Janine Ores, DNP, FNP-BC [facility] Pager: [phone]  I have personally obtained history,examined this patient, reviewed notes, independently viewed imaging studies, participated in medical decision making and plan of care.ROS completed by me personally and pertinent positives fully  documented  I have made any additions or clarifications directly to the above note. Agree with note above.    Antony Contras, MD Medical Director [facility] Pager: [phone] 01/02/2023 3:11 PM

## 2023-01-03 ENCOUNTER — Ambulatory Visit: Payer: Medicare Other | Attending: Physician Assistant | Primary: Internal Medicine

## 2023-01-03 DIAGNOSIS — S0510XA Contusion of eyeball and orbital tissues, unspecified eye, initial encounter: Secondary | ICD-10-CM

## 2023-01-03 DIAGNOSIS — I2699 Other pulmonary embolism without acute cor pulmonale: Secondary | ICD-10-CM

## 2023-01-03 DIAGNOSIS — R011 Cardiac murmur, unspecified: Secondary | ICD-10-CM

## 2023-01-03 DIAGNOSIS — M8580 Other specified disorders of bone density and structure, unspecified site: Secondary | ICD-10-CM

## 2023-01-03 DIAGNOSIS — H6993 Unspecified Eustachian tube disorder, bilateral: Secondary | ICD-10-CM

## 2023-01-03 DIAGNOSIS — E785 Hyperlipidemia, unspecified: Secondary | ICD-10-CM

## 2023-01-03 DIAGNOSIS — K219 Gastro-esophageal reflux disease without esophagitis: Secondary | ICD-10-CM

## 2023-01-03 DIAGNOSIS — I6350 Cerebral infarction due to unspecified occlusion or stenosis of unspecified cerebral artery: Secondary | ICD-10-CM

## 2023-01-03 DIAGNOSIS — R739 Hyperglycemia, unspecified: Secondary | ICD-10-CM

## 2023-01-03 DIAGNOSIS — M1990 Unspecified osteoarthritis, unspecified site: Secondary | ICD-10-CM

## 2023-01-03 DIAGNOSIS — Z9189 Other specified personal risk factors, not elsewhere classified: Secondary | ICD-10-CM

## 2023-01-03 DIAGNOSIS — I10 Essential (primary) hypertension: Secondary | ICD-10-CM

## 2023-01-03 DIAGNOSIS — Z6837 Body mass index (BMI) 37.0-37.9, adult: Principal | ICD-10-CM

## 2023-01-03 DIAGNOSIS — Z9109 Other allergy status, other than to drugs and biological substances: Secondary | ICD-10-CM

## 2023-01-03 DIAGNOSIS — IMO0002 Atrial fib/flutter, transient: Secondary | ICD-10-CM

## 2023-01-03 DIAGNOSIS — R251 Tremor, unspecified: Secondary | ICD-10-CM

## 2023-01-03 DIAGNOSIS — I2510 Atherosclerotic heart disease of native coronary artery without angina pectoris: Secondary | ICD-10-CM

## 2023-01-03 DIAGNOSIS — M329 Systemic lupus erythematosus, unspecified: Secondary | ICD-10-CM

## 2023-01-03 DIAGNOSIS — G43909 Migraine, unspecified, not intractable, without status migrainosus: Secondary | ICD-10-CM

## 2023-01-03 DIAGNOSIS — D649 Anemia, unspecified: Secondary | ICD-10-CM

## 2023-01-03 DIAGNOSIS — G459 Transient cerebral ischemic attack, unspecified: Secondary | ICD-10-CM

## 2023-01-03 DIAGNOSIS — J449 Chronic obstructive pulmonary disease, unspecified: Secondary | ICD-10-CM

## 2023-01-03 DIAGNOSIS — M3500 Sicca syndrome, unspecified: Secondary | ICD-10-CM

## 2023-01-03 DIAGNOSIS — F32A Depression: Secondary | ICD-10-CM

## 2023-01-03 DIAGNOSIS — Z91199 Personal history of noncompliance with medical treatment, presenting hazards to health: Secondary | ICD-10-CM

## 2023-01-03 DIAGNOSIS — N3281 Overactive bladder: Secondary | ICD-10-CM

## 2023-01-03 DIAGNOSIS — F419 Anxiety disorder, unspecified: Secondary | ICD-10-CM

## 2023-01-03 DIAGNOSIS — I8290 Acute embolism and thrombosis of unspecified vein: Secondary | ICD-10-CM

## 2023-01-03 MED ORDER — FLUTICASONE PROPIONATE 50 MCG/ACT NA SUSP
1 | Freq: Every day | NASAL | 1 refills | Status: CP
Start: 2023-01-03 — End: ?

## 2023-01-03 MED ORDER — VALSARTAN-HYDROCHLOROTHIAZIDE 320-25 MG PO TABS
1 | ORAL_TABLET | Freq: Every day | ORAL | 3 refills | Status: CP
Start: 2023-01-03 — End: ?

## 2023-01-03 MED ORDER — MOUNJARO 7.5 MG/0.5ML SC SOPN
7.5 mg | SUBCUTANEOUS | 3 refills | Status: CP
Start: 2023-01-03 — End: ?

## 2023-01-06 ENCOUNTER — Ambulatory Visit: Payer: Medicare Other | Attending: Clinical | Primary: Internal Medicine

## 2023-01-06 DIAGNOSIS — F410 Panic disorder [episodic paroxysmal anxiety] without agoraphobia: Secondary | ICD-10-CM

## 2023-01-06 DIAGNOSIS — D649 Anemia, unspecified: Secondary | ICD-10-CM

## 2023-01-06 DIAGNOSIS — S0510XA Contusion of eyeball and orbital tissues, unspecified eye, initial encounter: Secondary | ICD-10-CM

## 2023-01-06 DIAGNOSIS — F322 Major depressive disorder, single episode, severe without psychotic features: Principal | ICD-10-CM

## 2023-01-06 DIAGNOSIS — R251 Tremor, unspecified: Secondary | ICD-10-CM

## 2023-01-06 DIAGNOSIS — M1990 Unspecified osteoarthritis, unspecified site: Secondary | ICD-10-CM

## 2023-01-06 DIAGNOSIS — F419 Anxiety disorder, unspecified: Secondary | ICD-10-CM

## 2023-01-06 DIAGNOSIS — J449 Chronic obstructive pulmonary disease, unspecified: Secondary | ICD-10-CM

## 2023-01-06 DIAGNOSIS — M8580 Other specified disorders of bone density and structure, unspecified site: Secondary | ICD-10-CM

## 2023-01-06 DIAGNOSIS — IMO0002 Atrial fib/flutter, transient: Secondary | ICD-10-CM

## 2023-01-06 DIAGNOSIS — M3500 Sicca syndrome, unspecified: Secondary | ICD-10-CM

## 2023-01-06 DIAGNOSIS — Z9109 Other allergy status, other than to drugs and biological substances: Secondary | ICD-10-CM

## 2023-01-06 DIAGNOSIS — G459 Transient cerebral ischemic attack, unspecified: Secondary | ICD-10-CM

## 2023-01-06 DIAGNOSIS — F411 Generalized anxiety disorder: Secondary | ICD-10-CM

## 2023-01-06 DIAGNOSIS — I8290 Acute embolism and thrombosis of unspecified vein: Secondary | ICD-10-CM

## 2023-01-06 DIAGNOSIS — Z91199 Personal history of noncompliance with medical treatment, presenting hazards to health: Secondary | ICD-10-CM

## 2023-01-06 DIAGNOSIS — K219 Gastro-esophageal reflux disease without esophagitis: Secondary | ICD-10-CM

## 2023-01-06 DIAGNOSIS — I2510 Atherosclerotic heart disease of native coronary artery without angina pectoris: Secondary | ICD-10-CM

## 2023-01-06 DIAGNOSIS — N3281 Overactive bladder: Secondary | ICD-10-CM

## 2023-01-06 DIAGNOSIS — I6350 Cerebral infarction due to unspecified occlusion or stenosis of unspecified cerebral artery: Secondary | ICD-10-CM

## 2023-01-06 DIAGNOSIS — I10 Essential (primary) hypertension: Principal | ICD-10-CM

## 2023-01-06 DIAGNOSIS — R011 Cardiac murmur, unspecified: Secondary | ICD-10-CM

## 2023-01-06 DIAGNOSIS — F32A Depression: Secondary | ICD-10-CM

## 2023-01-06 DIAGNOSIS — E785 Hyperlipidemia, unspecified: Secondary | ICD-10-CM

## 2023-01-06 DIAGNOSIS — I2699 Other pulmonary embolism without acute cor pulmonale: Secondary | ICD-10-CM

## 2023-01-06 DIAGNOSIS — M329 Systemic lupus erythematosus, unspecified: Secondary | ICD-10-CM

## 2023-01-06 DIAGNOSIS — Z9189 Other specified personal risk factors, not elsewhere classified: Secondary | ICD-10-CM

## 2023-01-06 DIAGNOSIS — G43909 Migraine, unspecified, not intractable, without status migrainosus: Secondary | ICD-10-CM

## 2023-01-20 ENCOUNTER — Encounter: Payer: Medicare Other | Attending: Clinical | Primary: Internal Medicine

## 2023-01-20 DIAGNOSIS — F322 Major depressive disorder, single episode, severe without psychotic features: Secondary | ICD-10-CM

## 2023-01-21 ENCOUNTER — Telehealth: Payer: Medicare Other | Attending: Clinical | Primary: Internal Medicine

## 2023-01-21 DIAGNOSIS — K219 Gastro-esophageal reflux disease without esophagitis: Secondary | ICD-10-CM

## 2023-01-21 DIAGNOSIS — F419 Anxiety disorder, unspecified: Secondary | ICD-10-CM

## 2023-01-21 DIAGNOSIS — G459 Transient cerebral ischemic attack, unspecified: Secondary | ICD-10-CM

## 2023-01-21 DIAGNOSIS — R251 Tremor, unspecified: Secondary | ICD-10-CM

## 2023-01-21 DIAGNOSIS — M8580 Other specified disorders of bone density and structure, unspecified site: Secondary | ICD-10-CM

## 2023-01-21 DIAGNOSIS — E785 Hyperlipidemia, unspecified: Secondary | ICD-10-CM

## 2023-01-21 DIAGNOSIS — Z91199 Personal history of noncompliance with medical treatment, presenting hazards to health: Secondary | ICD-10-CM

## 2023-01-21 DIAGNOSIS — Z9109 Other allergy status, other than to drugs and biological substances: Secondary | ICD-10-CM

## 2023-01-21 DIAGNOSIS — M3500 Sicca syndrome, unspecified: Secondary | ICD-10-CM

## 2023-01-21 DIAGNOSIS — I2699 Other pulmonary embolism without acute cor pulmonale: Secondary | ICD-10-CM

## 2023-01-21 DIAGNOSIS — M329 Systemic lupus erythematosus, unspecified: Secondary | ICD-10-CM

## 2023-01-21 DIAGNOSIS — Z9189 Other specified personal risk factors, not elsewhere classified: Secondary | ICD-10-CM

## 2023-01-21 DIAGNOSIS — M1990 Unspecified osteoarthritis, unspecified site: Secondary | ICD-10-CM

## 2023-01-21 DIAGNOSIS — I10 Essential (primary) hypertension: Principal | ICD-10-CM

## 2023-01-21 DIAGNOSIS — G43909 Migraine, unspecified, not intractable, without status migrainosus: Secondary | ICD-10-CM

## 2023-01-21 DIAGNOSIS — F322 Major depressive disorder, single episode, severe without psychotic features: Secondary | ICD-10-CM

## 2023-01-21 DIAGNOSIS — D649 Anemia, unspecified: Secondary | ICD-10-CM

## 2023-01-21 DIAGNOSIS — N3281 Overactive bladder: Secondary | ICD-10-CM

## 2023-01-21 DIAGNOSIS — F32A Depression: Secondary | ICD-10-CM

## 2023-01-21 DIAGNOSIS — IMO0002 Atrial fib/flutter, transient: Secondary | ICD-10-CM

## 2023-01-21 DIAGNOSIS — J449 Chronic obstructive pulmonary disease, unspecified: Secondary | ICD-10-CM

## 2023-01-21 DIAGNOSIS — I6350 Cerebral infarction due to unspecified occlusion or stenosis of unspecified cerebral artery: Secondary | ICD-10-CM

## 2023-01-21 DIAGNOSIS — S0510XA Contusion of eyeball and orbital tissues, unspecified eye, initial encounter: Secondary | ICD-10-CM

## 2023-01-21 DIAGNOSIS — I2510 Atherosclerotic heart disease of native coronary artery without angina pectoris: Secondary | ICD-10-CM

## 2023-01-21 DIAGNOSIS — R011 Cardiac murmur, unspecified: Secondary | ICD-10-CM

## 2023-01-21 DIAGNOSIS — I8290 Acute embolism and thrombosis of unspecified vein: Secondary | ICD-10-CM

## 2023-02-03 ENCOUNTER — Encounter: Payer: Medicare Other | Attending: Clinical | Primary: Internal Medicine

## 2023-02-03 DIAGNOSIS — G43009 Migraine without aura, not intractable, without status migrainosus: Principal | ICD-10-CM

## 2023-02-04 MED ORDER — AIMOVIG 140 MG/ML SC SOAJ
SUBCUTANEOUS | 0 refills | 30.00000 days | Status: CP
Start: 2023-02-04 — End: ?

## 2023-02-13 ENCOUNTER — Encounter: Payer: Medicare Other | Attending: Physician Assistant | Primary: Internal Medicine

## 2023-02-20 DIAGNOSIS — M170 Bilateral primary osteoarthritis of knee: Principal | ICD-10-CM

## 2023-02-20 MED ORDER — DICLOFENAC SODIUM 1 % EX GEL
TOPICAL | 3 refills | 13.00000 days | Status: CP
Start: 2023-02-20 — End: ?

## 2023-03-04 ENCOUNTER — Telehealth: Payer: Medicare Other | Attending: Physician Assistant | Primary: Internal Medicine

## 2023-03-04 DIAGNOSIS — G459 Transient cerebral ischemic attack, unspecified: Secondary | ICD-10-CM

## 2023-03-04 DIAGNOSIS — M329 Systemic lupus erythematosus, unspecified: Secondary | ICD-10-CM

## 2023-03-04 DIAGNOSIS — F32A Depression: Secondary | ICD-10-CM

## 2023-03-04 DIAGNOSIS — F419 Anxiety disorder, unspecified: Secondary | ICD-10-CM

## 2023-03-04 DIAGNOSIS — K219 Gastro-esophageal reflux disease without esophagitis: Secondary | ICD-10-CM

## 2023-03-04 DIAGNOSIS — IMO0002 Atrial fib/flutter, transient: Secondary | ICD-10-CM

## 2023-03-04 DIAGNOSIS — F322 Major depressive disorder, single episode, severe without psychotic features: Secondary | ICD-10-CM

## 2023-03-04 DIAGNOSIS — G43909 Migraine, unspecified, not intractable, without status migrainosus: Secondary | ICD-10-CM

## 2023-03-04 DIAGNOSIS — S0510XA Contusion of eyeball and orbital tissues, unspecified eye, initial encounter: Secondary | ICD-10-CM

## 2023-03-04 DIAGNOSIS — I2699 Other pulmonary embolism without acute cor pulmonale: Secondary | ICD-10-CM

## 2023-03-04 DIAGNOSIS — N3281 Overactive bladder: Secondary | ICD-10-CM

## 2023-03-04 DIAGNOSIS — I10 Essential (primary) hypertension: Principal | ICD-10-CM

## 2023-03-04 DIAGNOSIS — M8580 Other specified disorders of bone density and structure, unspecified site: Secondary | ICD-10-CM

## 2023-03-04 DIAGNOSIS — D649 Anemia, unspecified: Secondary | ICD-10-CM

## 2023-03-04 DIAGNOSIS — I2510 Atherosclerotic heart disease of native coronary artery without angina pectoris: Secondary | ICD-10-CM

## 2023-03-04 DIAGNOSIS — R251 Tremor, unspecified: Secondary | ICD-10-CM

## 2023-03-04 DIAGNOSIS — Z9109 Other allergy status, other than to drugs and biological substances: Secondary | ICD-10-CM

## 2023-03-04 DIAGNOSIS — Z9189 Other specified personal risk factors, not elsewhere classified: Secondary | ICD-10-CM

## 2023-03-04 DIAGNOSIS — M1990 Unspecified osteoarthritis, unspecified site: Secondary | ICD-10-CM

## 2023-03-04 DIAGNOSIS — R011 Cardiac murmur, unspecified: Secondary | ICD-10-CM

## 2023-03-04 DIAGNOSIS — E785 Hyperlipidemia, unspecified: Secondary | ICD-10-CM

## 2023-03-04 DIAGNOSIS — I6350 Cerebral infarction due to unspecified occlusion or stenosis of unspecified cerebral artery: Secondary | ICD-10-CM

## 2023-03-04 DIAGNOSIS — M797 Fibromyalgia: Secondary | ICD-10-CM

## 2023-03-04 DIAGNOSIS — I8290 Acute embolism and thrombosis of unspecified vein: Secondary | ICD-10-CM

## 2023-03-04 DIAGNOSIS — J449 Chronic obstructive pulmonary disease, unspecified: Secondary | ICD-10-CM

## 2023-03-04 DIAGNOSIS — Z91199 Personal history of noncompliance with medical treatment, presenting hazards to health: Secondary | ICD-10-CM

## 2023-03-04 DIAGNOSIS — M3500 Sicca syndrome, unspecified: Secondary | ICD-10-CM

## 2023-03-04 DIAGNOSIS — M3501 Sicca syndrome with keratoconjunctivitis: Secondary | ICD-10-CM

## 2023-03-04 MED ORDER — MOUNJARO 10 MG/0.5ML SC SOPN
10 mg | SUBCUTANEOUS | 3 refills | Status: CP
Start: 2023-03-04 — End: ?

## 2023-03-04 MED ORDER — ARIPIPRAZOLE 2 MG PO TABS
2 mg | Freq: Every day | ORAL | 3 refills | 30.00000 days | Status: CP
Start: 2023-03-04 — End: ?

## 2023-03-05 ENCOUNTER — Encounter: Payer: Medicare Other | Attending: Physician Assistant | Primary: Internal Medicine

## 2023-03-20 ENCOUNTER — Other Ambulatory Visit: Payer: Self-pay

## 2023-03-20 ENCOUNTER — Emergency Department (HOSPITAL_BASED_OUTPATIENT_CLINIC_OR_DEPARTMENT_OTHER)
Admission: EM | Admit: 2023-03-20 | Discharge: 2023-03-20 | Disposition: A | Discharge status: Home / Self Care | Payer: 59 | Attending: Emergency Medicine | Admitting: Emergency Medicine

## 2023-03-20 ENCOUNTER — Emergency Department (HOSPITAL_BASED_OUTPATIENT_CLINIC_OR_DEPARTMENT_OTHER): Payer: 59

## 2023-03-20 ENCOUNTER — Encounter (HOSPITAL_BASED_OUTPATIENT_CLINIC_OR_DEPARTMENT_OTHER): Payer: Self-pay | Admitting: *Deleted

## 2023-03-20 DIAGNOSIS — Z7901 Long term (current) use of anticoagulants: Secondary | ICD-10-CM | POA: Insufficient documentation

## 2023-03-20 DIAGNOSIS — Z7982 Long term (current) use of aspirin: Secondary | ICD-10-CM | POA: Insufficient documentation

## 2023-03-20 DIAGNOSIS — S299XXA Unspecified injury of thorax, initial encounter: Secondary | ICD-10-CM | POA: Diagnosis present

## 2023-03-20 DIAGNOSIS — W01198A Fall on same level from slipping, tripping and stumbling with subsequent striking against other object, initial encounter: Secondary | ICD-10-CM | POA: Diagnosis not present

## 2023-03-20 DIAGNOSIS — S2231XA Fracture of one rib, right side, initial encounter for closed fracture: Secondary | ICD-10-CM | POA: Diagnosis not present

## 2023-03-20 MED ORDER — OXYCODONE HCL 5 MG PO TABS: 5.0000 mg | ORAL_TABLET | Freq: Four times a day (QID) | ORAL | 0 refills | Status: AC | PRN

## 2023-03-20 MED ORDER — IBUPROFEN 600 MG PO TABS: 600.0000 mg | ORAL_TABLET | Freq: Four times a day (QID) | ORAL | 0 refills | Status: AC | PRN

## 2023-03-20 NOTE — Discharge Instructions (Signed)
Note the workup today was consistent with right rib fracture.  Continue use incentive spirometry every 2 hours at home as directed by respiratory therapist.  Take Tylenol/Motrin as needed for baseline pain with pain medication for breakthrough pain.  Recommend reevaluation with primary care for reassessment of your symptoms.  Please not hesitate to return to emergency department for worrisome signs and symptoms we discussed become apparent.

## 2023-03-20 NOTE — ED Provider Notes (Signed)
[facility] Provider Note   CSN: [id number] Arrival date & time: 03/20/23  1506     History  Chief Complaint  Patient presents with   Rib Injury    Michelle Nash is a 57 y.o. female.  HPI   57 year old female presents emergency department with complaints of right sided rib pain.  Patient states that she was trying to bathe her dog in the bathtub when she slipped landing with the right side of her ribs on the edge of the tub.  Denies trauma elsewhere.  States that incident occurred on Monday.  Pain has persisted prompting her visit to the emergency department.  Reports pain is worsened with taking a deep breath as well as coughing/sneezing.  Denies fever, chills, trauma to head, loss of consciousness, abdominal pain, nausea, vomiting.  Past medical history significant for Sjogren's disease, pulmonary embolism on no anticoagulation, fibromyalgia, lupus  Home Medications Prior to Admission medications   Medication Sig Start Date End Date Taking? Authorizing Provider  ibuprofen (ADVIL) 600 MG tablet Take 1 tablet (600 mg total) by mouth every 6 (six) hours as needed. 03/20/23  Yes Dion Saucier A, PA  oxyCODONE (ROXICODONE) 5 MG immediate release tablet Take 1 tablet (5 mg total) by mouth every 6 (six) hours as needed for severe pain. 03/20/23  Yes Dion Saucier A, PA  aspirin EC 81 MG EC tablet Take 1 tablet (81 mg total) by mouth daily. Swallow whole. Patient not taking: Reported on 12/31/2022 05/05/21   [name], MD  benzonatate (TESSALON) 100 MG capsule Take 1 capsule (100 mg total) by mouth 3 (three) times daily as needed for cough. Patient not taking: Reported on 12/31/2022 12/13/21   [name], PA-C  cyclobenzaprine (FLEXERIL) 10 MG tablet Take 1 tablet by mouth at bedtime. 01/30/21   [provider]  cycloSPORINE (RESTASIS) 0.05 % ophthalmic emulsion Place 1 drop into both eyes 2 (two) times daily. 12/20/19   [provider]  diltiazem (CARDIZEM CD) 180 MG 24 hr capsule Take 180 mg by mouth every evening.    [provider]  DULoxetine (CYMBALTA) 30 MG capsule Take 30 mg by mouth 2 (two) times daily.    [provider]  Erenumab-aooe (AIMOVIG) 140 MG/ML SOAJ Inject 140 mg into the skin every 30 (thirty) days. 12/20/19   [provider]  hydroxychloroquine (PLAQUENIL) 200 MG tablet Take 200 mg by mouth 2 (two) times daily. 04/19/21   [provider]  MOUNJARO 2.5 MG/0.5ML Pen Inject 2.5 mg into the skin once a week. Patient not taking: Reported on 12/31/2022 11/14/22   [provider]  MOUNJARO 5 MG/0.5ML Pen Inject 5 mg into the skin once a week.    [provider]  pilocarpine (SALAGEN) 5 MG tablet Take 5 mg by mouth daily as needed (Dry mouth).    [provider]  predniSONE (DELTASONE) 5 MG tablet Take 5 mg by mouth daily with breakfast.    [provider]  pregabalin (LYRICA) 100 MG capsule Take 100 mg by mouth 2 (two) times daily.    [provider]  promethazine-dextromethorphan (PROMETHAZINE-DM) 6.25-15 MG/5ML syrup Take 5 mLs by mouth 4 (four) times daily as needed for cough. Patient not taking: Reported on 12/31/2022 12/15/21   [name], PA  valsartan-hydrochlorothiazide (DIOVAN-HCT) 320-25 MG tablet Take 1 tablet by mouth daily.    [provider]      Allergies    Lisinopril and Tylenol [  acetaminophen]    Review of Systems   Review of Systems  Physical Exam Updated Vital Signs BP 114/82 (BP Location: Left Arm)   Pulse (!) 102   Temp 98.2 F (36.8 C) (Oral)   Resp (!) 24   SpO2 99%  Physical Exam Vitals and nursing note reviewed.  Constitutional:      General: She is not in acute distress.    Appearance: She is well-developed.  HENT:     Head: Normocephalic and atraumatic.  Eyes:     Conjunctiva/sclera: Conjunctivae normal.  Cardiovascular:     Rate and Rhythm: Normal rate and  regular rhythm.     Heart sounds: No murmur heard. Pulmonary:     Effort: Pulmonary effort is normal. No respiratory distress.     Breath sounds: Normal breath sounds. No wheezing, rhonchi or rales.  Chest:     Chest wall: Tenderness present.  Abdominal:     Palpations: Abdomen is soft.     Tenderness: There is no abdominal tenderness. There is no right CVA tenderness, left CVA tenderness, guarding or rebound.  Musculoskeletal:        General: No swelling.     Cervical back: Neck supple.     Comments: No midline tenderness of cervical, thoracic, lumbar spine with no obvious step-off or deformity noted.  No anterior chest wall tenderness.  Patient with right lateral lower rib tenderness.  No obvious crepitus or step-off appreciated.  Patient is all 4 extremities without difficulty/pain.  No tenderness to palpation along upper or lower extremities.  Skin:    General: Skin is warm and dry.     Capillary Refill: Capillary refill takes less than 2 seconds.  Neurological:     Mental Status: She is alert.  Psychiatric:        Mood and Affect: Mood normal.     ED Results / Procedures / Treatments   Labs (all labs ordered are listed, but only abnormal results are displayed) Labs Reviewed - No data to display  EKG None  Radiology DG Ribs Unilateral W/Chest Right  Result Date: 03/20/2023 CLINICAL DATA:  Fall with right rib pain EXAM: RIGHT RIBS AND CHEST - 3 VIEW COMPARISON:  Chest x-ray dated December 12, 2021 FINDINGS: Mildly displaced fracture of the anterolateral right 9th rib. There is no evidence of pneumothorax or pleural effusion. Both lungs are clear. Heart size and mediastinal contours are within normal limits. IMPRESSION: Mildly displaced fracture of the anterolateral right 9th rib. Electronically Signed   By: Yetta Glassman M.D.   On: 03/20/2023 16:00    Procedures Procedures    Medications Ordered in ED Medications - No data to display  ED Course/ Medical Decision  Making/ A&P                             Medical Decision Making Amount and/or Complexity of Data Reviewed Radiology: ordered.  Risk Prescription drug management.   This patient presents to the ED for concern of right rib pain, this involves an extensive number of treatment options, and is a complaint that carries with it a high risk of complications and morbidity.  The differential diagnosis includes pneumothorax, hemothorax, fracture, dislocation, strain/sprain   Co morbidities that complicate the patient evaluation  See HPI   Additional history obtained:  Additional history obtained from EMR External records from outside source obtained and reviewed including hospital records   Lab Tests:  N/a   Imaging  Studies ordered:  I ordered imaging studies including chest x-ray with right ribs I independently visualized and interpreted imaging which showed mildly displaced anterior lateral right ninth rib. I agree with the radiologist interpretation   Cardiac Monitoring: / EKG:  The patient was maintained on a cardiac monitor.  I personally viewed and interpreted the cardiac monitored which showed an underlying rhythm of: Sinus rhythm   Consultations Obtained:  N/a   Problem List / ED Course / Critical interventions / Medication management  Rib fracture Reevaluation of the patient  showed that the patient stayed the same I have reviewed the patients home medicines and have made adjustments as needed   Social Determinants of Health:  Denies tobacco, illicit drug use   Test / Admission - Considered:  Right rib fracture Vitals signs significant for mildly tachycardic and tachypneic to 24 respectively this likely secondary to patient's pain with fracture. Otherwise within normal range and stable throughout visit. Imaging studies significant for: See above 57 year old female presents after mechanical fall with evidence of right-sided rib fracture.  No evidence of  pneumothorax, hemothorax or underlying injury to lung as appreciated on chest x-ray.  Patient overall well-appearing, afebrile in no acute distress.  Patient already has at home incentive spirometry from prior operations on the knee.  Recommend continued use of incentive spirometry at home every 2 hours while awake.  Recommend Tylenol/Motrin as needed for baseline.  Recommend follow-up with primary care within 2 to 3 days for reassessment of symptoms.  Treatment plan discussed with patient and she acknowledged understanding was agreeable to said plan. Worrisome signs and symptoms were discussed with the patient, and the patient acknowledged understanding to return to the ED if noticed. Patient was stable upon discharge.          Final Clinical Impression(s) / ED Diagnoses Final diagnoses:  Closed fracture of one rib of right side, initial encounter    Rx / DC Orders ED Discharge Orders          Ordered    ibuprofen (ADVIL) 600 MG tablet  Every 6 hours PRN        03/20/23 1704    oxyCODONE (ROXICODONE) 5 MG immediate release tablet  Every 6 hours PRN        03/20/23 1704              Wilnette Kales, [place] 03/20/23 1758    Tegeler, Gwenyth Allegra, MD 03/20/23 [phone]

## 2023-03-20 NOTE — ED Triage Notes (Signed)
Monday pt slipped and fell in bathtub and struck right side of her rib cage and hears some cracks.  Pt continues to have pain.  Pt has had lot of pain

## 2023-03-20 NOTE — ED Notes (Signed)
Patient has 2 incentives at home from previous admissions.  Instructed to used incentive q2h.

## 2023-03-24 ENCOUNTER — Ambulatory Visit: Payer: 59 | Admitting: Neurology

## 2023-03-31 ENCOUNTER — Encounter: Payer: Self-pay | Admitting: *Deleted

## 2023-04-02 ENCOUNTER — Encounter: Payer: Medicare Other | Attending: Internal Medicine | Primary: Internal Medicine

## 2023-04-15 ENCOUNTER — Ambulatory Visit: Payer: Medicare Other | Attending: Internal Medicine | Primary: Internal Medicine

## 2023-04-15 ENCOUNTER — Encounter: Payer: Medicare Other | Attending: Specialist | Primary: Internal Medicine

## 2023-04-15 DIAGNOSIS — I2699 Other pulmonary embolism without acute cor pulmonale: Secondary | ICD-10-CM

## 2023-04-15 DIAGNOSIS — R042 Hemoptysis: Secondary | ICD-10-CM

## 2023-04-15 DIAGNOSIS — Z79899 Other long term (current) drug therapy: Secondary | ICD-10-CM

## 2023-04-15 DIAGNOSIS — R011 Cardiac murmur, unspecified: Secondary | ICD-10-CM

## 2023-04-15 DIAGNOSIS — R251 Tremor, unspecified: Secondary | ICD-10-CM

## 2023-04-15 DIAGNOSIS — R051 Acute cough: Secondary | ICD-10-CM

## 2023-04-15 DIAGNOSIS — I10 Essential (primary) hypertension: Secondary | ICD-10-CM

## 2023-04-15 DIAGNOSIS — F419 Anxiety disorder, unspecified: Secondary | ICD-10-CM

## 2023-04-15 DIAGNOSIS — M797 Fibromyalgia: Secondary | ICD-10-CM

## 2023-04-15 DIAGNOSIS — M3500 Sicca syndrome, unspecified: Secondary | ICD-10-CM

## 2023-04-15 DIAGNOSIS — M329 Systemic lupus erythematosus, unspecified: Secondary | ICD-10-CM

## 2023-04-15 DIAGNOSIS — F32A Depression: Secondary | ICD-10-CM

## 2023-04-15 DIAGNOSIS — Z9109 Other allergy status, other than to drugs and biological substances: Secondary | ICD-10-CM

## 2023-04-15 DIAGNOSIS — I6350 Cerebral infarction due to unspecified occlusion or stenosis of unspecified cerebral artery: Secondary | ICD-10-CM

## 2023-04-15 DIAGNOSIS — I8290 Acute embolism and thrombosis of unspecified vein: Secondary | ICD-10-CM

## 2023-04-15 DIAGNOSIS — J449 Chronic obstructive pulmonary disease, unspecified: Secondary | ICD-10-CM

## 2023-04-15 DIAGNOSIS — N3281 Overactive bladder: Secondary | ICD-10-CM

## 2023-04-15 DIAGNOSIS — M8580 Other specified disorders of bone density and structure, unspecified site: Secondary | ICD-10-CM

## 2023-04-15 DIAGNOSIS — Z91199 Personal history of noncompliance with medical treatment, presenting hazards to health: Secondary | ICD-10-CM

## 2023-04-15 DIAGNOSIS — G43909 Migraine, unspecified, not intractable, without status migrainosus: Secondary | ICD-10-CM

## 2023-04-15 DIAGNOSIS — D649 Anemia, unspecified: Secondary | ICD-10-CM

## 2023-04-15 DIAGNOSIS — S0510XA Contusion of eyeball and orbital tissues, unspecified eye, initial encounter: Secondary | ICD-10-CM

## 2023-04-15 DIAGNOSIS — K219 Gastro-esophageal reflux disease without esophagitis: Secondary | ICD-10-CM

## 2023-04-15 DIAGNOSIS — R079 Chest pain, unspecified: Secondary | ICD-10-CM

## 2023-04-15 DIAGNOSIS — E785 Hyperlipidemia, unspecified: Secondary | ICD-10-CM

## 2023-04-15 DIAGNOSIS — M1990 Unspecified osteoarthritis, unspecified site: Secondary | ICD-10-CM

## 2023-04-15 DIAGNOSIS — G459 Transient cerebral ischemic attack, unspecified: Secondary | ICD-10-CM

## 2023-04-15 DIAGNOSIS — Z9189 Other specified personal risk factors, not elsewhere classified: Secondary | ICD-10-CM

## 2023-04-15 DIAGNOSIS — I2510 Atherosclerotic heart disease of native coronary artery without angina pectoris: Secondary | ICD-10-CM

## 2023-04-15 MED ORDER — PERFLUTREN LIPID MICROSPHERE 6.52 MG/ML IV SUSP
.2-1.3 mL | Freq: Once | INTRAVENOUS | PRN
Start: 2023-04-15 — End: ?

## 2023-04-15 MED ORDER — PREDNISONE 10 MG PO TABS
ORAL | 0 refills | Status: CP
Start: 2023-04-15 — End: ?

## 2023-04-15 MED ORDER — PERFLUTREN PROTEIN A MICROSPH IV SUSP
.5-8.7 mL | Freq: Once | INTRAVENOUS | PRN
Start: 2023-04-15 — End: ?

## 2023-04-16 DIAGNOSIS — F329 Major depressive disorder, single episode, unspecified: Principal | ICD-10-CM

## 2023-04-17 ENCOUNTER — Ambulatory Visit: Payer: Medicare Other | Attending: Internal Medicine | Primary: Internal Medicine

## 2023-04-17 DIAGNOSIS — F322 Major depressive disorder, single episode, severe without psychotic features: Principal | ICD-10-CM

## 2023-04-17 DIAGNOSIS — J449 Chronic obstructive pulmonary disease, unspecified: Secondary | ICD-10-CM

## 2023-04-17 DIAGNOSIS — G43909 Migraine, unspecified, not intractable, without status migrainosus: Secondary | ICD-10-CM

## 2023-04-17 DIAGNOSIS — R011 Cardiac murmur, unspecified: Secondary | ICD-10-CM

## 2023-04-17 DIAGNOSIS — N3281 Overactive bladder: Secondary | ICD-10-CM

## 2023-04-17 DIAGNOSIS — Z91199 Personal history of noncompliance with medical treatment, presenting hazards to health: Secondary | ICD-10-CM

## 2023-04-17 DIAGNOSIS — M25561 Pain in right knee: Secondary | ICD-10-CM

## 2023-04-17 DIAGNOSIS — I6350 Cerebral infarction due to unspecified occlusion or stenosis of unspecified cerebral artery: Secondary | ICD-10-CM

## 2023-04-17 DIAGNOSIS — S2231XS Fracture of one rib, right side, sequela: Secondary | ICD-10-CM

## 2023-04-17 DIAGNOSIS — M3500 Sicca syndrome, unspecified: Secondary | ICD-10-CM

## 2023-04-17 DIAGNOSIS — G8929 Other chronic pain: Secondary | ICD-10-CM

## 2023-04-17 DIAGNOSIS — E785 Hyperlipidemia, unspecified: Secondary | ICD-10-CM

## 2023-04-17 DIAGNOSIS — S0510XA Contusion of eyeball and orbital tissues, unspecified eye, initial encounter: Secondary | ICD-10-CM

## 2023-04-17 DIAGNOSIS — E669 Obesity, unspecified: Secondary | ICD-10-CM

## 2023-04-17 DIAGNOSIS — Z6836 Body mass index (BMI) 36.0-36.9, adult: Secondary | ICD-10-CM

## 2023-04-17 DIAGNOSIS — Z9109 Other allergy status, other than to drugs and biological substances: Secondary | ICD-10-CM

## 2023-04-17 DIAGNOSIS — F329 Major depressive disorder, single episode, unspecified: Secondary | ICD-10-CM

## 2023-04-17 DIAGNOSIS — R251 Tremor, unspecified: Secondary | ICD-10-CM

## 2023-04-17 DIAGNOSIS — D649 Anemia, unspecified: Secondary | ICD-10-CM

## 2023-04-17 DIAGNOSIS — K219 Gastro-esophageal reflux disease without esophagitis: Secondary | ICD-10-CM

## 2023-04-17 DIAGNOSIS — F410 Panic disorder [episodic paroxysmal anxiety] without agoraphobia: Secondary | ICD-10-CM

## 2023-04-17 DIAGNOSIS — F411 Generalized anxiety disorder: Secondary | ICD-10-CM

## 2023-04-17 DIAGNOSIS — Z9189 Other specified personal risk factors, not elsewhere classified: Secondary | ICD-10-CM

## 2023-04-17 DIAGNOSIS — M797 Fibromyalgia: Secondary | ICD-10-CM

## 2023-04-17 DIAGNOSIS — I2699 Other pulmonary embolism without acute cor pulmonale: Secondary | ICD-10-CM

## 2023-04-17 DIAGNOSIS — Z6837 Body mass index (BMI) 37.0-37.9, adult: Secondary | ICD-10-CM

## 2023-04-17 DIAGNOSIS — M1990 Unspecified osteoarthritis, unspecified site: Secondary | ICD-10-CM

## 2023-04-17 DIAGNOSIS — F32A Depression: Secondary | ICD-10-CM

## 2023-04-17 DIAGNOSIS — I8290 Acute embolism and thrombosis of unspecified vein: Secondary | ICD-10-CM

## 2023-04-17 DIAGNOSIS — I2510 Atherosclerotic heart disease of native coronary artery without angina pectoris: Secondary | ICD-10-CM

## 2023-04-17 DIAGNOSIS — M8580 Other specified disorders of bone density and structure, unspecified site: Secondary | ICD-10-CM

## 2023-04-17 DIAGNOSIS — G459 Transient cerebral ischemic attack, unspecified: Secondary | ICD-10-CM

## 2023-04-17 DIAGNOSIS — M329 Systemic lupus erythematosus, unspecified: Secondary | ICD-10-CM

## 2023-04-17 DIAGNOSIS — F419 Anxiety disorder, unspecified: Secondary | ICD-10-CM

## 2023-04-17 DIAGNOSIS — I10 Essential (primary) hypertension: Principal | ICD-10-CM

## 2023-04-17 DIAGNOSIS — M25562 Pain in left knee: Secondary | ICD-10-CM

## 2023-04-17 MED ORDER — DULOXETINE HCL 30 MG PO CPEP
30 mg | Freq: Two times a day (BID) | ORAL | 2 refills | Status: CP
Start: 2023-04-17 — End: ?

## 2023-04-17 MED ORDER — MOUNJARO 10 MG/0.5ML SC SOPN
10 mg | SUBCUTANEOUS | 3 refills | Status: CP
Start: 2023-04-17 — End: ?

## 2023-04-17 MED ORDER — PREGABALIN 100 MG PO CAPS
100 mg | Freq: Two times a day (BID) | ORAL | 2 refills | 30.00000 days | Status: CP
Start: 2023-04-17 — End: ?

## 2023-04-17 MED ORDER — ARIPIPRAZOLE 2 MG PO TABS
2 mg | Freq: Every day | ORAL | 3 refills | Status: CP
Start: 2023-04-17 — End: ?

## 2023-04-17 MED ORDER — LIDOCAINE 5 % EX PTCH
1 | MEDICATED_PATCH | TRANSDERMAL | 2 refills | 30.00000 days | Status: CP
Start: 2023-04-17 — End: ?

## 2023-04-17 MED ORDER — DULOXETINE HCL 30 MG PO CPEP
30 mg | Freq: Two times a day (BID) | ORAL | 2 refills | Status: CP
Start: 2023-04-17 — End: 2023-04-17

## 2023-04-21 ENCOUNTER — Ambulatory Visit: Payer: Medicare Other | Attending: Clinical | Primary: Internal Medicine

## 2023-04-21 DIAGNOSIS — G459 Transient cerebral ischemic attack, unspecified: Secondary | ICD-10-CM

## 2023-04-21 DIAGNOSIS — E785 Hyperlipidemia, unspecified: Secondary | ICD-10-CM

## 2023-04-21 DIAGNOSIS — K219 Gastro-esophageal reflux disease without esophagitis: Secondary | ICD-10-CM

## 2023-04-21 DIAGNOSIS — M3500 Sicca syndrome, unspecified: Secondary | ICD-10-CM

## 2023-04-21 DIAGNOSIS — F322 Major depressive disorder, single episode, severe without psychotic features: Secondary | ICD-10-CM

## 2023-04-21 DIAGNOSIS — F32A Depression: Secondary | ICD-10-CM

## 2023-04-21 DIAGNOSIS — F411 Generalized anxiety disorder: Principal | ICD-10-CM

## 2023-04-21 DIAGNOSIS — J449 Chronic obstructive pulmonary disease, unspecified: Secondary | ICD-10-CM

## 2023-04-21 DIAGNOSIS — D649 Anemia, unspecified: Secondary | ICD-10-CM

## 2023-04-21 DIAGNOSIS — M8580 Other specified disorders of bone density and structure, unspecified site: Secondary | ICD-10-CM

## 2023-04-21 DIAGNOSIS — R251 Tremor, unspecified: Secondary | ICD-10-CM

## 2023-04-21 DIAGNOSIS — Z91199 Personal history of noncompliance with medical treatment, presenting hazards to health: Secondary | ICD-10-CM

## 2023-04-21 DIAGNOSIS — G43909 Migraine, unspecified, not intractable, without status migrainosus: Secondary | ICD-10-CM

## 2023-04-21 DIAGNOSIS — Z9189 Other specified personal risk factors, not elsewhere classified: Secondary | ICD-10-CM

## 2023-04-21 DIAGNOSIS — F410 Panic disorder [episodic paroxysmal anxiety] without agoraphobia: Secondary | ICD-10-CM

## 2023-04-21 DIAGNOSIS — I2699 Other pulmonary embolism without acute cor pulmonale: Secondary | ICD-10-CM

## 2023-04-21 DIAGNOSIS — M329 Systemic lupus erythematosus, unspecified: Secondary | ICD-10-CM

## 2023-04-21 DIAGNOSIS — F419 Anxiety disorder, unspecified: Secondary | ICD-10-CM

## 2023-04-21 DIAGNOSIS — R011 Cardiac murmur, unspecified: Secondary | ICD-10-CM

## 2023-04-21 DIAGNOSIS — N3281 Overactive bladder: Secondary | ICD-10-CM

## 2023-04-21 DIAGNOSIS — I6350 Cerebral infarction due to unspecified occlusion or stenosis of unspecified cerebral artery: Secondary | ICD-10-CM

## 2023-04-21 DIAGNOSIS — M1990 Unspecified osteoarthritis, unspecified site: Secondary | ICD-10-CM

## 2023-04-21 DIAGNOSIS — S0510XA Contusion of eyeball and orbital tissues, unspecified eye, initial encounter: Secondary | ICD-10-CM

## 2023-04-21 DIAGNOSIS — Z9109 Other allergy status, other than to drugs and biological substances: Secondary | ICD-10-CM

## 2023-04-21 DIAGNOSIS — I2510 Atherosclerotic heart disease of native coronary artery without angina pectoris: Secondary | ICD-10-CM

## 2023-04-21 DIAGNOSIS — I8290 Acute embolism and thrombosis of unspecified vein: Secondary | ICD-10-CM

## 2023-04-21 DIAGNOSIS — I10 Essential (primary) hypertension: Principal | ICD-10-CM

## 2023-04-25 DIAGNOSIS — R053 Cough, persistent: Principal | ICD-10-CM

## 2023-04-25 MED ORDER — AZITHROMYCIN 250 MG PO TABS
ORAL | 0 refills | 3.00000 days | Status: CP
Start: 2023-04-25 — End: ?

## 2023-04-29 ENCOUNTER — Inpatient Hospital Stay: Admit: 2023-04-29 | Discharge: 2023-04-30 | Payer: Medicare Other | Primary: Internal Medicine

## 2023-04-29 DIAGNOSIS — R079 Chest pain, unspecified: Secondary | ICD-10-CM

## 2023-04-29 DIAGNOSIS — M3500 Sicca syndrome, unspecified: Secondary | ICD-10-CM

## 2023-04-29 DIAGNOSIS — M329 Systemic lupus erythematosus, unspecified: Principal | ICD-10-CM

## 2023-04-29 DIAGNOSIS — M3501 Sicca syndrome with keratoconjunctivitis: Principal | ICD-10-CM

## 2023-04-29 MED ORDER — PERFLUTREN PROTEIN A MICROSPH IV SUSP
.5-8.7 mL | Freq: Once | INTRAVENOUS | Status: CP | PRN
Start: 2023-04-29 — End: ?

## 2023-04-29 MED ORDER — RESTASIS MULTIDOSE 0.05 % OP EMUL
OPHTHALMIC | 2 refills | 15.00000 days | Status: CP
Start: 2023-04-29 — End: ?

## 2023-05-02 ENCOUNTER — Telehealth: Payer: Medicare Other | Attending: Internal Medicine | Primary: Internal Medicine

## 2023-05-02 DIAGNOSIS — I10 Essential (primary) hypertension: Principal | ICD-10-CM

## 2023-05-02 DIAGNOSIS — J449 Chronic obstructive pulmonary disease, unspecified: Secondary | ICD-10-CM

## 2023-05-02 DIAGNOSIS — M329 Systemic lupus erythematosus, unspecified: Secondary | ICD-10-CM

## 2023-05-02 DIAGNOSIS — Z796 Long-term use of immunosuppressant medication: Secondary | ICD-10-CM

## 2023-05-02 DIAGNOSIS — D649 Anemia, unspecified: Secondary | ICD-10-CM

## 2023-05-02 DIAGNOSIS — M8580 Other specified disorders of bone density and structure, unspecified site: Secondary | ICD-10-CM

## 2023-05-02 DIAGNOSIS — S0510XA Contusion of eyeball and orbital tissues, unspecified eye, initial encounter: Secondary | ICD-10-CM

## 2023-05-02 DIAGNOSIS — E785 Hyperlipidemia, unspecified: Secondary | ICD-10-CM

## 2023-05-02 DIAGNOSIS — M3500 Sicca syndrome, unspecified: Secondary | ICD-10-CM

## 2023-05-02 DIAGNOSIS — Z79899 Other long term (current) drug therapy: Secondary | ICD-10-CM

## 2023-05-02 DIAGNOSIS — G459 Transient cerebral ischemic attack, unspecified: Secondary | ICD-10-CM

## 2023-05-02 DIAGNOSIS — Z91199 Personal history of noncompliance with medical treatment, presenting hazards to health: Secondary | ICD-10-CM

## 2023-05-02 DIAGNOSIS — Z9109 Other allergy status, other than to drugs and biological substances: Secondary | ICD-10-CM

## 2023-05-02 DIAGNOSIS — Z9189 Other specified personal risk factors, not elsewhere classified: Secondary | ICD-10-CM

## 2023-05-02 DIAGNOSIS — I2699 Other pulmonary embolism without acute cor pulmonale: Secondary | ICD-10-CM

## 2023-05-02 DIAGNOSIS — M797 Fibromyalgia: Secondary | ICD-10-CM

## 2023-05-02 DIAGNOSIS — M1990 Unspecified osteoarthritis, unspecified site: Secondary | ICD-10-CM

## 2023-05-02 DIAGNOSIS — N3281 Overactive bladder: Secondary | ICD-10-CM

## 2023-05-02 DIAGNOSIS — I6350 Cerebral infarction due to unspecified occlusion or stenosis of unspecified cerebral artery: Secondary | ICD-10-CM

## 2023-05-02 DIAGNOSIS — F32A Depression: Secondary | ICD-10-CM

## 2023-05-02 DIAGNOSIS — F419 Anxiety disorder, unspecified: Secondary | ICD-10-CM

## 2023-05-02 DIAGNOSIS — G43909 Migraine, unspecified, not intractable, without status migrainosus: Secondary | ICD-10-CM

## 2023-05-02 DIAGNOSIS — I2510 Atherosclerotic heart disease of native coronary artery without angina pectoris: Secondary | ICD-10-CM

## 2023-05-02 DIAGNOSIS — K219 Gastro-esophageal reflux disease without esophagitis: Secondary | ICD-10-CM

## 2023-05-02 DIAGNOSIS — R011 Cardiac murmur, unspecified: Secondary | ICD-10-CM

## 2023-05-02 DIAGNOSIS — I8290 Acute embolism and thrombosis of unspecified vein: Secondary | ICD-10-CM

## 2023-05-02 DIAGNOSIS — R251 Tremor, unspecified: Secondary | ICD-10-CM

## 2023-05-02 MED ORDER — AZATHIOPRINE 50 MG PO TABS
50 mg | Freq: Every day | ORAL | 1 refills | Status: CP
Start: 2023-05-02 — End: ?

## 2023-05-02 MED ORDER — PREDNISONE 5 MG PO TABS
5 mg | Freq: Every day | ORAL | 1 refills | Status: CP
Start: 2023-05-02 — End: ?

## 2023-05-05 ENCOUNTER — Ambulatory Visit: Payer: Medicare Other | Attending: Clinical | Primary: Internal Medicine

## 2023-05-05 DIAGNOSIS — D649 Anemia, unspecified: Secondary | ICD-10-CM

## 2023-05-05 DIAGNOSIS — F411 Generalized anxiety disorder: Principal | ICD-10-CM

## 2023-05-05 DIAGNOSIS — F419 Anxiety disorder, unspecified: Secondary | ICD-10-CM

## 2023-05-05 DIAGNOSIS — M8580 Other specified disorders of bone density and structure, unspecified site: Secondary | ICD-10-CM

## 2023-05-05 DIAGNOSIS — M1990 Unspecified osteoarthritis, unspecified site: Secondary | ICD-10-CM

## 2023-05-05 DIAGNOSIS — F39 Unspecified mood [affective] disorder: Secondary | ICD-10-CM

## 2023-05-05 DIAGNOSIS — J449 Chronic obstructive pulmonary disease, unspecified: Secondary | ICD-10-CM

## 2023-05-05 DIAGNOSIS — Z91199 Personal history of noncompliance with medical treatment, presenting hazards to health: Secondary | ICD-10-CM

## 2023-05-05 DIAGNOSIS — K219 Gastro-esophageal reflux disease without esophagitis: Secondary | ICD-10-CM

## 2023-05-05 DIAGNOSIS — I10 Essential (primary) hypertension: Principal | ICD-10-CM

## 2023-05-05 DIAGNOSIS — M3500 Sicca syndrome, unspecified: Secondary | ICD-10-CM

## 2023-05-05 DIAGNOSIS — F322 Major depressive disorder, single episode, severe without psychotic features: Secondary | ICD-10-CM

## 2023-05-05 DIAGNOSIS — G43009 Migraine without aura, not intractable, without status migrainosus: Principal | ICD-10-CM

## 2023-05-05 DIAGNOSIS — G459 Transient cerebral ischemic attack, unspecified: Secondary | ICD-10-CM

## 2023-05-05 DIAGNOSIS — F32A Depression: Secondary | ICD-10-CM

## 2023-05-05 DIAGNOSIS — S0510XA Contusion of eyeball and orbital tissues, unspecified eye, initial encounter: Secondary | ICD-10-CM

## 2023-05-05 DIAGNOSIS — R251 Tremor, unspecified: Secondary | ICD-10-CM

## 2023-05-05 DIAGNOSIS — Z9189 Other specified personal risk factors, not elsewhere classified: Secondary | ICD-10-CM

## 2023-05-05 DIAGNOSIS — I8290 Acute embolism and thrombosis of unspecified vein: Secondary | ICD-10-CM

## 2023-05-05 DIAGNOSIS — R011 Cardiac murmur, unspecified: Secondary | ICD-10-CM

## 2023-05-05 DIAGNOSIS — M329 Systemic lupus erythematosus, unspecified: Secondary | ICD-10-CM

## 2023-05-05 DIAGNOSIS — I6350 Cerebral infarction due to unspecified occlusion or stenosis of unspecified cerebral artery: Secondary | ICD-10-CM

## 2023-05-05 DIAGNOSIS — I2699 Other pulmonary embolism without acute cor pulmonale: Secondary | ICD-10-CM

## 2023-05-05 DIAGNOSIS — G43909 Migraine, unspecified, not intractable, without status migrainosus: Secondary | ICD-10-CM

## 2023-05-05 DIAGNOSIS — Z9109 Other allergy status, other than to drugs and biological substances: Secondary | ICD-10-CM

## 2023-05-05 DIAGNOSIS — E785 Hyperlipidemia, unspecified: Secondary | ICD-10-CM

## 2023-05-05 DIAGNOSIS — I2510 Atherosclerotic heart disease of native coronary artery without angina pectoris: Secondary | ICD-10-CM

## 2023-05-05 DIAGNOSIS — N3281 Overactive bladder: Secondary | ICD-10-CM

## 2023-05-05 DIAGNOSIS — F410 Panic disorder [episodic paroxysmal anxiety] without agoraphobia: Secondary | ICD-10-CM

## 2023-05-06 MED ORDER — AIMOVIG 140 MG/ML SC SOAJ
SUBCUTANEOUS | 0 refills | 30.00000 days | Status: CP
Start: 2023-05-06 — End: ?

## 2023-05-08 ENCOUNTER — Ambulatory Visit: Payer: Medicare Other | Attending: Ophthalmology | Primary: Internal Medicine

## 2023-05-08 DIAGNOSIS — Z79899 Other long term (current) drug therapy: Principal | ICD-10-CM

## 2023-05-08 DIAGNOSIS — G459 Transient cerebral ischemic attack, unspecified: Secondary | ICD-10-CM

## 2023-05-08 DIAGNOSIS — S0510XA Contusion of eyeball and orbital tissues, unspecified eye, initial encounter: Secondary | ICD-10-CM

## 2023-05-08 DIAGNOSIS — Z9109 Other allergy status, other than to drugs and biological substances: Secondary | ICD-10-CM

## 2023-05-08 DIAGNOSIS — N3281 Overactive bladder: Secondary | ICD-10-CM

## 2023-05-08 DIAGNOSIS — H2513 Age-related nuclear cataract, bilateral: Secondary | ICD-10-CM

## 2023-05-08 DIAGNOSIS — I2699 Other pulmonary embolism without acute cor pulmonale: Secondary | ICD-10-CM

## 2023-05-08 DIAGNOSIS — J449 Chronic obstructive pulmonary disease, unspecified: Secondary | ICD-10-CM

## 2023-05-08 DIAGNOSIS — R011 Cardiac murmur, unspecified: Secondary | ICD-10-CM

## 2023-05-08 DIAGNOSIS — M329 Systemic lupus erythematosus, unspecified: Secondary | ICD-10-CM

## 2023-05-08 DIAGNOSIS — M3500 Sicca syndrome, unspecified: Secondary | ICD-10-CM

## 2023-05-08 DIAGNOSIS — I6350 Cerebral infarction due to unspecified occlusion or stenosis of unspecified cerebral artery: Secondary | ICD-10-CM

## 2023-05-08 DIAGNOSIS — D649 Anemia, unspecified: Secondary | ICD-10-CM

## 2023-05-08 DIAGNOSIS — I8290 Acute embolism and thrombosis of unspecified vein: Secondary | ICD-10-CM

## 2023-05-08 DIAGNOSIS — F419 Anxiety disorder, unspecified: Secondary | ICD-10-CM

## 2023-05-08 DIAGNOSIS — M3501 Sicca syndrome with keratoconjunctivitis: Secondary | ICD-10-CM

## 2023-05-08 DIAGNOSIS — H43393 Other vitreous opacities, bilateral: Secondary | ICD-10-CM

## 2023-05-08 DIAGNOSIS — R251 Tremor, unspecified: Secondary | ICD-10-CM

## 2023-05-08 DIAGNOSIS — F32A Depression: Secondary | ICD-10-CM

## 2023-05-08 DIAGNOSIS — M1990 Unspecified osteoarthritis, unspecified site: Secondary | ICD-10-CM

## 2023-05-08 DIAGNOSIS — Z91199 Personal history of noncompliance with medical treatment, presenting hazards to health: Secondary | ICD-10-CM

## 2023-05-08 DIAGNOSIS — I2510 Atherosclerotic heart disease of native coronary artery without angina pectoris: Secondary | ICD-10-CM

## 2023-05-08 DIAGNOSIS — E785 Hyperlipidemia, unspecified: Secondary | ICD-10-CM

## 2023-05-08 DIAGNOSIS — Z9189 Other specified personal risk factors, not elsewhere classified: Secondary | ICD-10-CM

## 2023-05-08 DIAGNOSIS — G43909 Migraine, unspecified, not intractable, without status migrainosus: Secondary | ICD-10-CM

## 2023-05-08 DIAGNOSIS — M8580 Other specified disorders of bone density and structure, unspecified site: Secondary | ICD-10-CM

## 2023-05-08 DIAGNOSIS — K219 Gastro-esophageal reflux disease without esophagitis: Secondary | ICD-10-CM

## 2023-05-08 DIAGNOSIS — I10 Essential (primary) hypertension: Principal | ICD-10-CM

## 2023-05-08 MED ORDER — MOUNJARO 10 MG/0.5ML SC SOPN
10 mg | SUBCUTANEOUS | 3 refills | Status: CP
Start: 2023-05-08 — End: ?

## 2023-05-21 ENCOUNTER — Ambulatory Visit
Admit: 2023-05-21 | Discharge: 2023-05-22 | Payer: Medicare Other | Attending: Internal Medicine | Primary: Internal Medicine

## 2023-05-21 DIAGNOSIS — I2699 Other pulmonary embolism without acute cor pulmonale: Secondary | ICD-10-CM

## 2023-05-21 DIAGNOSIS — J449 Chronic obstructive pulmonary disease, unspecified: Secondary | ICD-10-CM

## 2023-05-21 DIAGNOSIS — R011 Cardiac murmur, unspecified: Secondary | ICD-10-CM

## 2023-05-21 DIAGNOSIS — Z9109 Other allergy status, other than to drugs and biological substances: Secondary | ICD-10-CM

## 2023-05-21 DIAGNOSIS — I2510 Atherosclerotic heart disease of native coronary artery without angina pectoris: Secondary | ICD-10-CM

## 2023-05-21 DIAGNOSIS — Z91199 Personal history of noncompliance with medical treatment, presenting hazards to health: Secondary | ICD-10-CM

## 2023-05-21 DIAGNOSIS — S0510XA Contusion of eyeball and orbital tissues, unspecified eye, initial encounter: Secondary | ICD-10-CM

## 2023-05-21 DIAGNOSIS — F419 Anxiety disorder, unspecified: Secondary | ICD-10-CM

## 2023-05-21 DIAGNOSIS — K219 Gastro-esophageal reflux disease without esophagitis: Secondary | ICD-10-CM

## 2023-05-21 DIAGNOSIS — I6350 Cerebral infarction due to unspecified occlusion or stenosis of unspecified cerebral artery: Secondary | ICD-10-CM

## 2023-05-21 DIAGNOSIS — I10 Essential (primary) hypertension: Principal | ICD-10-CM

## 2023-05-21 DIAGNOSIS — M1990 Unspecified osteoarthritis, unspecified site: Secondary | ICD-10-CM

## 2023-05-21 DIAGNOSIS — N3281 Overactive bladder: Secondary | ICD-10-CM

## 2023-05-21 DIAGNOSIS — I8290 Acute embolism and thrombosis of unspecified vein: Secondary | ICD-10-CM

## 2023-05-21 DIAGNOSIS — Z9189 Other specified personal risk factors, not elsewhere classified: Secondary | ICD-10-CM

## 2023-05-21 DIAGNOSIS — G43009 Migraine without aura, not intractable, without status migrainosus: Principal | ICD-10-CM

## 2023-05-21 DIAGNOSIS — G43909 Migraine, unspecified, not intractable, without status migrainosus: Secondary | ICD-10-CM

## 2023-05-21 DIAGNOSIS — R251 Tremor, unspecified: Secondary | ICD-10-CM

## 2023-05-21 DIAGNOSIS — M329 Systemic lupus erythematosus, unspecified: Secondary | ICD-10-CM

## 2023-05-21 DIAGNOSIS — M8580 Other specified disorders of bone density and structure, unspecified site: Secondary | ICD-10-CM

## 2023-05-21 DIAGNOSIS — G459 Transient cerebral ischemic attack, unspecified: Secondary | ICD-10-CM

## 2023-05-21 DIAGNOSIS — F32A Depression: Secondary | ICD-10-CM

## 2023-05-21 DIAGNOSIS — M3500 Sicca syndrome, unspecified: Secondary | ICD-10-CM

## 2023-05-21 DIAGNOSIS — E785 Hyperlipidemia, unspecified: Secondary | ICD-10-CM

## 2023-05-21 DIAGNOSIS — D649 Anemia, unspecified: Secondary | ICD-10-CM

## 2023-05-21 MED ORDER — LASMIDITAN SUCCINATE 100 MG PO TABS
100 mg | ORAL | 1 refills | Status: CP | PRN
Start: 2023-05-21 — End: ?

## 2023-06-01 DIAGNOSIS — M797 Fibromyalgia: Secondary | ICD-10-CM

## 2023-06-01 DIAGNOSIS — H6993 Unspecified Eustachian tube disorder, bilateral: Principal | ICD-10-CM

## 2023-06-02 MED ORDER — FLUTICASONE PROPIONATE 50 MCG/ACT NA SUSP
1 | Freq: Every day | NASAL | 1 refills | 30.00000 days | Status: CP
Start: 2023-06-02 — End: ?

## 2023-06-02 MED ORDER — CYCLOBENZAPRINE HCL 10 MG PO TABS
10 mg | Freq: Every evening | ORAL | 1 refills | Status: CP
Start: 2023-06-02 — End: ?

## 2023-06-03 DIAGNOSIS — M797 Fibromyalgia: Secondary | ICD-10-CM

## 2023-06-03 DIAGNOSIS — M3500 Sicca syndrome, unspecified: Secondary | ICD-10-CM

## 2023-06-03 DIAGNOSIS — M3219 Other organ or system involvement in systemic lupus erythematosus: Principal | ICD-10-CM

## 2023-06-03 MED ORDER — PREGABALIN 100 MG PO CAPS
100 mg | Freq: Two times a day (BID) | ORAL | 2 refills | Status: CN
Start: 2023-06-03 — End: ?

## 2023-06-03 MED ORDER — HYDROXYCHLOROQUINE SULFATE 200 MG PO TABS
200 mg | Freq: Two times a day (BID) | ORAL | 0 refills | 60.00 days | Status: CP
Start: 2023-06-03 — End: ?

## 2023-06-06 ENCOUNTER — Telehealth: Payer: Medicare Other | Attending: Internal Medicine | Primary: Internal Medicine

## 2023-06-06 DIAGNOSIS — G459 Transient cerebral ischemic attack, unspecified: Secondary | ICD-10-CM

## 2023-06-06 DIAGNOSIS — I2699 Other pulmonary embolism without acute cor pulmonale: Secondary | ICD-10-CM

## 2023-06-06 DIAGNOSIS — M329 Systemic lupus erythematosus, unspecified: Secondary | ICD-10-CM

## 2023-06-06 DIAGNOSIS — R251 Tremor, unspecified: Secondary | ICD-10-CM

## 2023-06-06 DIAGNOSIS — M1990 Unspecified osteoarthritis, unspecified site: Secondary | ICD-10-CM

## 2023-06-06 DIAGNOSIS — I10 Essential (primary) hypertension: Principal | ICD-10-CM

## 2023-06-06 DIAGNOSIS — M3500 Sicca syndrome, unspecified: Secondary | ICD-10-CM

## 2023-06-06 DIAGNOSIS — I2510 Atherosclerotic heart disease of native coronary artery without angina pectoris: Secondary | ICD-10-CM

## 2023-06-06 DIAGNOSIS — D649 Anemia, unspecified: Secondary | ICD-10-CM

## 2023-06-06 DIAGNOSIS — R011 Cardiac murmur, unspecified: Secondary | ICD-10-CM

## 2023-06-06 DIAGNOSIS — Z91199 Personal history of noncompliance with medical treatment, presenting hazards to health: Secondary | ICD-10-CM

## 2023-06-06 DIAGNOSIS — F419 Anxiety disorder, unspecified: Secondary | ICD-10-CM

## 2023-06-06 DIAGNOSIS — E559 Vitamin D deficiency, unspecified: Secondary | ICD-10-CM

## 2023-06-06 DIAGNOSIS — K219 Gastro-esophageal reflux disease without esophagitis: Secondary | ICD-10-CM

## 2023-06-06 DIAGNOSIS — S0510XA Contusion of eyeball and orbital tissues, unspecified eye, initial encounter: Secondary | ICD-10-CM

## 2023-06-06 DIAGNOSIS — F32A Depression: Secondary | ICD-10-CM

## 2023-06-06 DIAGNOSIS — N3281 Overactive bladder: Secondary | ICD-10-CM

## 2023-06-06 DIAGNOSIS — M8580 Other specified disorders of bone density and structure, unspecified site: Secondary | ICD-10-CM

## 2023-06-06 DIAGNOSIS — Z79899 Other long term (current) drug therapy: Secondary | ICD-10-CM

## 2023-06-06 DIAGNOSIS — Z9109 Other allergy status, other than to drugs and biological substances: Secondary | ICD-10-CM

## 2023-06-06 DIAGNOSIS — M3219 Other organ or system involvement in systemic lupus erythematosus: Principal | ICD-10-CM

## 2023-06-06 DIAGNOSIS — E785 Hyperlipidemia, unspecified: Secondary | ICD-10-CM

## 2023-06-06 DIAGNOSIS — Z9189 Other specified personal risk factors, not elsewhere classified: Secondary | ICD-10-CM

## 2023-06-06 DIAGNOSIS — Z796 Long-term use of immunosuppressant medication: Secondary | ICD-10-CM

## 2023-06-06 DIAGNOSIS — I8290 Acute embolism and thrombosis of unspecified vein: Secondary | ICD-10-CM

## 2023-06-06 DIAGNOSIS — I6350 Cerebral infarction due to unspecified occlusion or stenosis of unspecified cerebral artery: Secondary | ICD-10-CM

## 2023-06-06 DIAGNOSIS — J449 Chronic obstructive pulmonary disease, unspecified: Secondary | ICD-10-CM

## 2023-06-06 DIAGNOSIS — M797 Fibromyalgia: Secondary | ICD-10-CM

## 2023-06-06 DIAGNOSIS — G43909 Migraine, unspecified, not intractable, without status migrainosus: Secondary | ICD-10-CM

## 2023-06-09 ENCOUNTER — Encounter: Payer: Medicare Other | Attending: Clinical | Primary: Internal Medicine

## 2023-06-11 ENCOUNTER — Encounter: Payer: Medicare Other | Attending: Nurse Practitioner | Primary: Internal Medicine

## 2023-06-12 DIAGNOSIS — M797 Fibromyalgia: Principal | ICD-10-CM

## 2023-06-12 MED ORDER — PREGABALIN 100 MG PO CAPS
100 mg | Freq: Two times a day (BID) | ORAL | 2 refills | 30.00000 days | Status: CP
Start: 2023-06-12 — End: ?

## 2023-06-30 ENCOUNTER — Ambulatory Visit: Payer: 59 | Admitting: Neurology

## 2023-07-02 ENCOUNTER — Ambulatory Visit (INDEPENDENT_AMBULATORY_CARE_PROVIDER_SITE_OTHER): Payer: 59 | Admitting: Neurology

## 2023-07-02 ENCOUNTER — Encounter: Payer: Self-pay | Admitting: Neurology

## 2023-07-02 ENCOUNTER — Encounter: Attending: Internal Medicine | Primary: Internal Medicine

## 2023-07-02 VITALS — BP 128/70 | Ht 67.0 in | Wt 227.0 lb

## 2023-07-02 DIAGNOSIS — G43E09 Chronic migraine with aura, not intractable, without status migrainosus: Secondary | ICD-10-CM

## 2023-07-02 MED ORDER — AIMOVIG 140 MG/ML ~~LOC~~ SOAJ: 140.0000 mg | SUBCUTANEOUS | 3 refills | Status: AC

## 2023-07-02 MED ORDER — UBRELVY 100 MG PO TABS: ORAL_TABLET | ORAL | 6 refills | Status: AC

## 2023-07-02 MED ORDER — LAMOTRIGINE 25 MG PO TABS: ORAL_TABLET | ORAL | 11 refills | Status: AC

## 2023-07-02 NOTE — Progress Notes (Signed)
Chief Complaint  Patient presents with   New Patient (Initial Visit)    Rm 15, alone, NP, migraines and stroke like symptoms, currently takes aimovig, still has headaches      ASSESSMENT AND PLAN  Michelle Nash is a 57 y.o. female   Chronic migraine headaches  Was previously managed by the neurologist at [place], tried and failed 2 different preventive medication such as Topamax, nortriptyline, Botox injection, and abortive treatment such as Imitrex, Maxalt, Zomig,  Did very well with Aimovig 140 mg monthly injection,  Was treated with Reyvow as abortive treatment, which is serotonin (5-HT) 49F receptor agonist, as needed, which did help her migraine some, but still last 1 to 3 days,  After discussed with patient, decided on continue aimovig, try Ubrelvy as needed  Her migraines are almost always preceded by aura, mental confusion, blurry vision, will add on lamotrigine 25 titrating to 50 mg every night as preventive medication  Return To Clinic With NP In 6 Months   DIAGNOSTIC DATA (LABS, IMAGING, TESTING) - I reviewed patient records, labs, notes, testing and imaging myself where available.   MEDICAL HISTORY:  Michelle Nash, is a 57 year old female seen in request by her primary care nurse practitioner Michelle Nash, for evaluation of migraine, initial evaluation July 02, 2023   I reviewed and summarized the referring note. PMHX. HTN Lupus Fibromyogia Hx of bilateral knee replacement DM  She has a long history of migraine, "as long as I can remember", bilateral retro-orbital area severe headache with light noise sensitivity, nausea, vomiting, often proceeding by all worse, such as mental confusion, blurry vision, headache can last 5 to 6 days,  Over the years, she was given different preventive medications, with suboptimal control such as Topamax, nortriptyline, Botox injection, eventually she was treated with aimovig around 2020, responding well, reported 90%  improvement  Now she is having migraine headache about once or twice each months, in 2023 was given Reyvow by her [place] neurologist, which did help her symptoms some, but the headaches still last 1 to 2 days,  Previously she has tried Imitrex, Maxalt, Zomig as needed with suboptimal response,  Personally reviewed MRI of brain in January 2024 that was normal  Laboratory 2024, A1c 5.7, negative HIV, lipid panel, CMP, CBC   PHYSICAL EXAM:   Vitals:   07/02/23 0852  BP: 128/70  Weight: 227 lb (103 kg)  Height: 5\' 7"  (1.702 m)   Not recorded     Body mass index is 35.55 kg/m.  PHYSICAL EXAMNIATION:  Gen: NAD, conversant, well nourised, well groomed                     Cardiovascular: Regular rate rhythm, no peripheral edema, warm, nontender. Eyes: Conjunctivae clear without exudates or hemorrhage Neck: Supple, no carotid bruits. Pulmonary: Clear to auscultation bilaterally   NEUROLOGICAL EXAM:  MENTAL STATUS: Speech/cognition: Awake, alert, oriented to history taking and casual conversation CRANIAL NERVES: CN II: Visual fields are full to confrontation. Pupils are round equal and briskly reactive to light. CN III, IV, VI: extraocular movement are normal. No ptosis. CN V: Facial sensation is intact to light touch CN VII: Face is symmetric with normal eye closure  CN VIII: Hearing is normal to causal conversation. CN IX, X: Phonation is normal. CN XI: Head turning and shoulder shrug are intact  MOTOR: There is no pronator drift of out-stretched arms. Muscle bulk and tone are normal. Muscle strength is normal.  REFLEXES: Reflexes are 2+  and symmetric at the biceps, triceps, knees, and ankles. Plantar responses are flexor.  SENSORY: Intact to light touch, pinprick and vibratory sensation are intact in fingers and toes.  COORDINATION: There is no trunk or limb dysmetria noted.  GAIT/STANCE: Posture is normal. Gait is steady with normal steps, base, arm swing, and  turning. Heel and toe walking are normal. Tandem gait is normal.  Romberg is absent.  REVIEW OF SYSTEMS:  Full 14 system review of systems performed and notable only for as above All other review of systems were negative.   ALLERGIES: Allergies  Allergen Reactions   Lisinopril Cough    ACE cough   Tylenol [Acetaminophen] Nausea And Vomiting    HOME MEDICATIONS: Current Outpatient Medications  Medication Sig Dispense Refill   cyclobenzaprine (FLEXERIL) 10 MG tablet Take 1 tablet by mouth at bedtime.     cycloSPORINE (RESTASIS) 0.05 % ophthalmic emulsion Place 1 drop into both eyes 2 (two) times daily.     diltiazem (CARDIZEM CD) 180 MG 24 hr capsule Take 180 mg by mouth every evening.     DULoxetine (CYMBALTA) 30 MG capsule Take 30 mg by mouth 2 (two) times daily.     Erenumab-aooe (AIMOVIG) 140 MG/ML SOAJ Inject 140 mg into the skin every 30 (thirty) days.     hydroxychloroquine (PLAQUENIL) 200 MG tablet Take 200 mg by mouth 2 (two) times daily.     oxyCODONE (ROXICODONE) 5 MG immediate release tablet Take 1 tablet (5 mg total) by mouth every 6 (six) hours as needed for severe pain. 8 tablet 0   pilocarpine (SALAGEN) 5 MG tablet Take 5 mg by mouth daily as needed (Dry mouth).     pregabalin (LYRICA) 100 MG capsule Take 100 mg by mouth 2 (two) times daily.     valsartan-hydrochlorothiazide (DIOVAN-HCT) 320-25 MG tablet Take 1 tablet by mouth daily.     aspirin EC 81 MG EC tablet Take 1 tablet (81 mg total) by mouth daily. Swallow whole. (Patient not taking: Reported on 12/31/2022) 30 tablet 11   benzonatate (TESSALON) 100 MG capsule Take 1 capsule (100 mg total) by mouth 3 (three) times daily as needed for cough. (Patient not taking: Reported on 12/31/2022) 15 capsule 0   ibuprofen (ADVIL) 600 MG tablet Take 1 tablet (600 mg total) by mouth every 6 (six) hours as needed. (Patient not taking: Reported on 07/02/2023) 30 tablet 0   MOUNJARO 2.5 MG/0.5ML Pen Inject 2.5 mg into the skin once a  week. (Patient not taking: Reported on 12/31/2022)     MOUNJARO 5 MG/0.5ML Pen Inject 10 mg into the skin once a week.     predniSONE (DELTASONE) 5 MG tablet Take 5 mg by mouth daily with breakfast.     promethazine-dextromethorphan (PROMETHAZINE-DM) 6.25-15 MG/5ML syrup Take 5 mLs by mouth 4 (four) times daily as needed for cough. (Patient not taking: Reported on 12/31/2022) 118 mL 0   No current facility-administered medications for this visit.    PAST MEDICAL HISTORY: Past Medical History:  Diagnosis Date   Coronary artery spasm (HCC)    Fibromyalgia    Lupus (HCC)    Osteoarthritis    Pulmonary emboli (HCC)    Sjogren's disease (HCC)    Stroke (HCC)     PAST SURGICAL HISTORY: Past Surgical History:  Procedure Laterality Date   CESAREAN SECTION     CHOLECYSTECTOMY      FAMILY HISTORY: Family History  Problem Relation Age of Onset   Heart disease Mother  Stroke Mother    Hypertension Mother     SOCIAL HISTORY: Social History   Socioeconomic History   Marital status: Single    Spouse name: Not on file   Number of children: Not on file   Years of education: Not on file   Highest education level: Not on file  Occupational History   Not on file  Tobacco Use   Smoking status: Never   Smokeless tobacco: Never  Vaping Use   Vaping status: Never Used  Substance and Sexual Activity   Alcohol use: Never   Drug use: Never   Sexual activity: Not Currently    Birth control/protection: None  Other Topics Concern   Not on file  Social History Narrative   Right handed   Caffeine- 1 cup daily   Lives alone   Social Determinants of Health   Financial Resource Strain: High Risk (01/06/2023)   [facility]   Overall Financial Resource Strain (CARDIA)    Difficulty of Paying Living Expenses: Hard  Food Insecurity: No Food Insecurity (01/06/2023)   [facility]   Hunger Vital Sign    Worried About Running Out of Food in the  Last Year: Never true    Ran Out of Food in the Last Year: Never true  Transportation Needs: No Transportation Needs (01/06/2023)   [facility]   PRAPARE - Transportation    Lack of Transportation (Medical): No    Lack of Transportation (Non-Medical): No  Physical Activity: Inactive (01/06/2023)   [facility]   Exercise Vital Sign    Days of Exercise per Week: 0 days    Minutes of Exercise per Session: 0 min  Stress: Stress Concern Present (01/06/2023)   [facility]   [employer] of Occupational Health - Occupational Stress Questionnaire    Feeling of Stress : Very much  Social Connections: Moderately Isolated (01/06/2023)   [facility]   Social Connection and Isolation Panel [NHANES]    Frequency of Communication with Friends and Family: More than three times a week    Frequency of Social Gatherings with Friends and Family: Once a week    Attends Religious Services: More than 4 times per year    Active Member of [employer] or Organizations: No    Attends Banker Meetings: Never    Marital Status: Never married  Intimate Partner Violence: Unknown (03/21/2022)   Received from [employer], [facility]   HITS    Physically Hurt: Not on file    Insult or Talk Down To: Not on file    Threaten Physical Harm: Not on file    Scream or Curse: Not on file      Levert Feinstein, M.D. Ph.D.  [facility] [address], [address] [place], [place] [address] Ph: [phone] Fax: [phone]  CC:  Michelle Picker, NP [address] [place],  [place] [address]  System, Provider Not In

## 2023-07-07 ENCOUNTER — Other Ambulatory Visit (HOSPITAL_COMMUNITY): Payer: Self-pay

## 2023-07-15 ENCOUNTER — Telehealth: Payer: Self-pay

## 2023-07-15 ENCOUNTER — Other Ambulatory Visit (HOSPITAL_COMMUNITY): Payer: Self-pay

## 2023-07-15 NOTE — Telephone Encounter (Signed)
Pharmacy Patient Advocate Encounter   Received notification from CoverMyMeds that prior authorization for Ubrelvy 100MG  tablets is required/requested.   Insurance verification completed.   The patient is insured through [facility] .   Per test claim: PA required; PA submitted to [facility] via CoverMyMeds Key/confirmation #/EOC [id number] Status is pending

## 2023-07-16 ENCOUNTER — Other Ambulatory Visit (HOSPITAL_COMMUNITY): Payer: Self-pay

## 2023-07-16 NOTE — Telephone Encounter (Signed)
Pharmacy Patient Advocate Encounter  Received notification from [facility] that Prior Authorization for Ubrelvy 100MG  tablets has been APPROVED from 07/15/2023 to 12/16/2023. Ran test claim, Copay is $0  PA #/Case ID/Reference #: PA Case ID #: [id number]

## 2023-07-18 ENCOUNTER — Encounter: Payer: Medicare Other | Attending: Physician Assistant | Primary: Internal Medicine

## 2023-07-31 ENCOUNTER — Encounter: Attending: Internal Medicine | Primary: Internal Medicine

## 2023-08-02 DIAGNOSIS — G43009 Migraine without aura, not intractable, without status migrainosus: Principal | ICD-10-CM

## 2023-08-04 MED ORDER — AIMOVIG 140 MG/ML SC SOAJ
SUBCUTANEOUS | 3 refills | 30.00000 days | Status: CP
Start: 2023-08-04 — End: ?

## 2023-08-12 DIAGNOSIS — I10 Essential (primary) hypertension: Principal | ICD-10-CM

## 2023-08-13 MED ORDER — DILTIAZEM HCL ER 180 MG PO CP24
ORAL | 2 refills | 30.00000 days | Status: CP
Start: 2023-08-13 — End: ?

## 2023-08-28 DIAGNOSIS — F329 Major depressive disorder, single episode, unspecified: Principal | ICD-10-CM

## 2023-08-28 MED ORDER — DULOXETINE HCL 30 MG PO CPEP
30 mg | Freq: Two times a day (BID) | ORAL | 2 refills | Status: CP
Start: 2023-08-28 — End: ?

## 2023-08-29 DIAGNOSIS — M3219 Other organ or system involvement in systemic lupus erythematosus: Principal | ICD-10-CM

## 2023-08-29 DIAGNOSIS — M3500 Sicca syndrome, unspecified: Secondary | ICD-10-CM

## 2023-08-29 MED ORDER — HYDROXYCHLOROQUINE SULFATE 200 MG PO TABS
200 mg | Freq: Two times a day (BID) | ORAL | 0 refills | Status: CP
Start: 2023-08-29 — End: ?

## 2023-09-03 MED ORDER — MOUNJARO 10 MG/0.5ML SC SOPN
10 mg | SUBCUTANEOUS | 3 refills | Status: CP
Start: 2023-09-03 — End: ?

## 2023-09-08 ENCOUNTER — Encounter: Payer: Medicare Other | Attending: Internal Medicine | Primary: Internal Medicine

## 2023-09-09 ENCOUNTER — Telehealth: Payer: Medicare Other | Attending: Internal Medicine | Primary: Internal Medicine

## 2023-09-09 DIAGNOSIS — M1990 Unspecified osteoarthritis, unspecified site: Secondary | ICD-10-CM

## 2023-09-09 DIAGNOSIS — Z9109 Other allergy status, other than to drugs and biological substances: Secondary | ICD-10-CM

## 2023-09-09 DIAGNOSIS — Z79899 Other long term (current) drug therapy: Secondary | ICD-10-CM

## 2023-09-09 DIAGNOSIS — D649 Anemia, unspecified: Secondary | ICD-10-CM

## 2023-09-09 DIAGNOSIS — Z796 Long-term use of immunosuppressant medication: Secondary | ICD-10-CM

## 2023-09-09 DIAGNOSIS — N3281 Overactive bladder: Secondary | ICD-10-CM

## 2023-09-09 DIAGNOSIS — F419 Anxiety disorder, unspecified: Secondary | ICD-10-CM

## 2023-09-09 DIAGNOSIS — Z91199 Personal history of noncompliance with medical treatment, presenting hazards to health: Secondary | ICD-10-CM

## 2023-09-09 DIAGNOSIS — I2699 Other pulmonary embolism without acute cor pulmonale: Secondary | ICD-10-CM

## 2023-09-09 DIAGNOSIS — I8290 Acute embolism and thrombosis of unspecified vein: Secondary | ICD-10-CM

## 2023-09-09 DIAGNOSIS — M8580 Other specified disorders of bone density and structure, unspecified site: Secondary | ICD-10-CM

## 2023-09-09 DIAGNOSIS — E785 Hyperlipidemia, unspecified: Secondary | ICD-10-CM

## 2023-09-09 DIAGNOSIS — G43909 Migraine, unspecified, not intractable, without status migrainosus: Secondary | ICD-10-CM

## 2023-09-09 DIAGNOSIS — G459 Transient cerebral ischemic attack, unspecified: Secondary | ICD-10-CM

## 2023-09-09 DIAGNOSIS — R011 Cardiac murmur, unspecified: Secondary | ICD-10-CM

## 2023-09-09 DIAGNOSIS — I10 Essential (primary) hypertension: Principal | ICD-10-CM

## 2023-09-09 DIAGNOSIS — M797 Fibromyalgia: Secondary | ICD-10-CM

## 2023-09-09 DIAGNOSIS — I2510 Atherosclerotic heart disease of native coronary artery without angina pectoris: Secondary | ICD-10-CM

## 2023-09-09 DIAGNOSIS — I6350 Cerebral infarction due to unspecified occlusion or stenosis of unspecified cerebral artery: Secondary | ICD-10-CM

## 2023-09-09 DIAGNOSIS — R251 Tremor, unspecified: Secondary | ICD-10-CM

## 2023-09-09 DIAGNOSIS — Z9189 Other specified personal risk factors, not elsewhere classified: Secondary | ICD-10-CM

## 2023-09-09 DIAGNOSIS — M329 Systemic lupus erythematosus, unspecified: Secondary | ICD-10-CM

## 2023-09-09 DIAGNOSIS — F322 Major depressive disorder, single episode, severe without psychotic features: Principal | ICD-10-CM

## 2023-09-09 DIAGNOSIS — E559 Vitamin D deficiency, unspecified: Secondary | ICD-10-CM

## 2023-09-09 DIAGNOSIS — M3500 Sicca syndrome, unspecified: Secondary | ICD-10-CM

## 2023-09-09 DIAGNOSIS — J449 Chronic obstructive pulmonary disease, unspecified: Secondary | ICD-10-CM

## 2023-09-09 DIAGNOSIS — K219 Gastro-esophageal reflux disease without esophagitis: Secondary | ICD-10-CM

## 2023-09-09 DIAGNOSIS — F32A Depression: Secondary | ICD-10-CM

## 2023-09-09 DIAGNOSIS — M8589 Other specified disorders of bone density and structure, multiple sites: Secondary | ICD-10-CM

## 2023-09-09 DIAGNOSIS — S0510XA Contusion of eyeball and orbital tissues, unspecified eye, initial encounter: Secondary | ICD-10-CM

## 2023-09-09 MED ORDER — ERGOCALCIFEROL 1.25 MG (50000 UT) PO CAPS
1.25 mg | ORAL | 1 refills | 28.00000 days | Status: CP
Start: 2023-09-09 — End: ?

## 2023-09-09 MED ORDER — PREDNISONE 5 MG PO TABS
5 mg | Freq: Every day | ORAL | 1 refills | 6.00000 days | Status: CP
Start: 2023-09-09 — End: ?

## 2023-09-09 MED ORDER — HYDROXYCHLOROQUINE SULFATE 200 MG PO TABS
200 mg | Freq: Two times a day (BID) | ORAL | 1 refills | Status: CP
Start: 2023-09-09 — End: ?

## 2023-09-09 MED ORDER — PILOCARPINE HCL 5 MG PO TABS
5 mg | Freq: Two times a day (BID) | ORAL | 3 refills | Status: CP | PRN
Start: 2023-09-09 — End: ?

## 2023-09-09 MED ORDER — ARIPIPRAZOLE 2 MG PO TABS
2 mg | Freq: Every day | ORAL | 3 refills | Status: CP
Start: 2023-09-09 — End: ?

## 2023-09-09 MED ORDER — AZATHIOPRINE 50 MG PO TABS
50 mg | Freq: Every day | ORAL | 1 refills | Status: CP
Start: 2023-09-09 — End: ?

## 2023-10-06 ENCOUNTER — Ambulatory Visit: Payer: Medicare Other | Attending: Internal Medicine | Primary: Internal Medicine

## 2023-10-06 DIAGNOSIS — G459 Transient cerebral ischemic attack, unspecified: Secondary | ICD-10-CM

## 2023-10-06 DIAGNOSIS — J449 Chronic obstructive pulmonary disease, unspecified: Secondary | ICD-10-CM

## 2023-10-06 DIAGNOSIS — K219 Gastro-esophageal reflux disease without esophagitis: Secondary | ICD-10-CM

## 2023-10-06 DIAGNOSIS — I2510 Atherosclerotic heart disease of native coronary artery without angina pectoris: Secondary | ICD-10-CM

## 2023-10-06 DIAGNOSIS — M1990 Unspecified osteoarthritis, unspecified site: Secondary | ICD-10-CM

## 2023-10-06 DIAGNOSIS — I8290 Acute embolism and thrombosis of unspecified vein: Secondary | ICD-10-CM

## 2023-10-06 DIAGNOSIS — I2699 Other pulmonary embolism without acute cor pulmonale: Secondary | ICD-10-CM

## 2023-10-06 DIAGNOSIS — Z6836 Body mass index (BMI) 36.0-36.9, adult: Secondary | ICD-10-CM

## 2023-10-06 DIAGNOSIS — M797 Fibromyalgia: Principal | ICD-10-CM

## 2023-10-06 DIAGNOSIS — I6350 Cerebral infarction due to unspecified occlusion or stenosis of unspecified cerebral artery: Secondary | ICD-10-CM

## 2023-10-06 DIAGNOSIS — Z9109 Other allergy status, other than to drugs and biological substances: Secondary | ICD-10-CM

## 2023-10-06 DIAGNOSIS — R251 Tremor, unspecified: Secondary | ICD-10-CM

## 2023-10-06 DIAGNOSIS — Z91199 Personal history of noncompliance with medical treatment, presenting hazards to health: Secondary | ICD-10-CM

## 2023-10-06 DIAGNOSIS — F419 Anxiety disorder, unspecified: Secondary | ICD-10-CM

## 2023-10-06 DIAGNOSIS — N3281 Overactive bladder: Secondary | ICD-10-CM

## 2023-10-06 DIAGNOSIS — Z9189 Other specified personal risk factors, not elsewhere classified: Secondary | ICD-10-CM

## 2023-10-06 DIAGNOSIS — Z79899 Other long term (current) drug therapy: Secondary | ICD-10-CM

## 2023-10-06 DIAGNOSIS — IMO0002 Lupus: Secondary | ICD-10-CM

## 2023-10-06 DIAGNOSIS — E785 Hyperlipidemia, unspecified: Secondary | ICD-10-CM

## 2023-10-06 DIAGNOSIS — R635 Abnormal weight gain: Secondary | ICD-10-CM

## 2023-10-06 DIAGNOSIS — M3500 Sicca syndrome, unspecified: Secondary | ICD-10-CM

## 2023-10-06 DIAGNOSIS — F32A Depression: Secondary | ICD-10-CM

## 2023-10-06 DIAGNOSIS — D649 Anemia, unspecified: Secondary | ICD-10-CM

## 2023-10-06 DIAGNOSIS — S0510XA Contusion of eyeball and orbital tissues, unspecified eye, initial encounter: Secondary | ICD-10-CM

## 2023-10-06 DIAGNOSIS — M8580 Other specified disorders of bone density and structure, unspecified site: Secondary | ICD-10-CM

## 2023-10-06 DIAGNOSIS — G43909 Migraine, unspecified, not intractable, without status migrainosus: Secondary | ICD-10-CM

## 2023-10-06 DIAGNOSIS — R011 Cardiac murmur, unspecified: Secondary | ICD-10-CM

## 2023-10-06 DIAGNOSIS — I10 Essential (primary) hypertension: Principal | ICD-10-CM

## 2023-10-06 MED ORDER — TIRZEPATIDE 12.5 MG/0.5ML SC SOAJ
12.5 mg | SUBCUTANEOUS | 2 refills | 28.00000 days | Status: CP
Start: 2023-10-06 — End: ?

## 2023-10-10 ENCOUNTER — Inpatient Hospital Stay: Admit: 2023-10-10 | Discharge: 2023-10-11 | Payer: Medicare Other | Primary: Internal Medicine

## 2023-10-10 ENCOUNTER — Ambulatory Visit: Payer: Medicare Other | Attending: Sports Medicine | Primary: Internal Medicine

## 2023-10-10 ENCOUNTER — Encounter: Payer: Medicare Other | Primary: Internal Medicine

## 2023-10-10 DIAGNOSIS — J449 Chronic obstructive pulmonary disease, unspecified: Secondary | ICD-10-CM

## 2023-10-10 DIAGNOSIS — M25561 Pain in right knee: Secondary | ICD-10-CM

## 2023-10-10 DIAGNOSIS — M3500 Sicca syndrome, unspecified: Secondary | ICD-10-CM

## 2023-10-10 DIAGNOSIS — I2510 Atherosclerotic heart disease of native coronary artery without angina pectoris: Secondary | ICD-10-CM

## 2023-10-10 DIAGNOSIS — M8580 Other specified disorders of bone density and structure, unspecified site: Secondary | ICD-10-CM

## 2023-10-10 DIAGNOSIS — F419 Anxiety disorder, unspecified: Secondary | ICD-10-CM

## 2023-10-10 DIAGNOSIS — IMO0002 Lupus: Secondary | ICD-10-CM

## 2023-10-10 DIAGNOSIS — K219 Gastro-esophageal reflux disease without esophagitis: Secondary | ICD-10-CM

## 2023-10-10 DIAGNOSIS — Z96651 Presence of right artificial knee joint: Principal | ICD-10-CM

## 2023-10-10 DIAGNOSIS — Z9109 Other allergy status, other than to drugs and biological substances: Secondary | ICD-10-CM

## 2023-10-10 DIAGNOSIS — N3281 Overactive bladder: Secondary | ICD-10-CM

## 2023-10-10 DIAGNOSIS — I6350 Cerebral infarction due to unspecified occlusion or stenosis of unspecified cerebral artery: Secondary | ICD-10-CM

## 2023-10-10 DIAGNOSIS — G459 Transient cerebral ischemic attack, unspecified: Secondary | ICD-10-CM

## 2023-10-10 DIAGNOSIS — Z96652 Presence of left artificial knee joint: Secondary | ICD-10-CM

## 2023-10-10 DIAGNOSIS — G43909 Migraine, unspecified, not intractable, without status migrainosus: Secondary | ICD-10-CM

## 2023-10-10 DIAGNOSIS — E785 Hyperlipidemia, unspecified: Secondary | ICD-10-CM

## 2023-10-10 DIAGNOSIS — D649 Anemia, unspecified: Secondary | ICD-10-CM

## 2023-10-10 DIAGNOSIS — R251 Tremor, unspecified: Secondary | ICD-10-CM

## 2023-10-10 DIAGNOSIS — I2699 Other pulmonary embolism without acute cor pulmonale: Secondary | ICD-10-CM

## 2023-10-10 DIAGNOSIS — S0510XA Contusion of eyeball and orbital tissues, unspecified eye, initial encounter: Secondary | ICD-10-CM

## 2023-10-10 DIAGNOSIS — I8290 Acute embolism and thrombosis of unspecified vein: Secondary | ICD-10-CM

## 2023-10-10 DIAGNOSIS — M25562 Pain in left knee: Secondary | ICD-10-CM

## 2023-10-10 DIAGNOSIS — Z91199 Personal history of noncompliance with medical treatment, presenting hazards to health: Secondary | ICD-10-CM

## 2023-10-10 DIAGNOSIS — F32A Depression: Secondary | ICD-10-CM

## 2023-10-10 DIAGNOSIS — M1990 Unspecified osteoarthritis, unspecified site: Secondary | ICD-10-CM

## 2023-10-10 DIAGNOSIS — M8589 Other specified disorders of bone density and structure, multiple sites: Principal | ICD-10-CM

## 2023-10-10 DIAGNOSIS — I10 Essential (primary) hypertension: Principal | ICD-10-CM

## 2023-10-10 DIAGNOSIS — R011 Cardiac murmur, unspecified: Secondary | ICD-10-CM

## 2023-10-10 DIAGNOSIS — Z9189 Other specified personal risk factors, not elsewhere classified: Secondary | ICD-10-CM

## 2023-11-06 ENCOUNTER — Encounter: Payer: Medicare Other | Attending: Internal Medicine | Primary: Internal Medicine

## 2023-11-10 ENCOUNTER — Encounter: Payer: Medicare Other | Primary: Internal Medicine

## 2023-11-17 ENCOUNTER — Ambulatory Visit: Payer: Medicare Other | Primary: Internal Medicine

## 2023-11-17 DIAGNOSIS — G459 Transient cerebral ischemic attack, unspecified: Secondary | ICD-10-CM

## 2023-11-17 DIAGNOSIS — M797 Fibromyalgia: Secondary | ICD-10-CM

## 2023-11-17 DIAGNOSIS — F32A Depression: Secondary | ICD-10-CM

## 2023-11-17 DIAGNOSIS — Z6836 Body mass index (BMI) 36.0-36.9, adult: Secondary | ICD-10-CM

## 2023-11-17 DIAGNOSIS — M810 Age-related osteoporosis without current pathological fracture: Secondary | ICD-10-CM

## 2023-11-17 DIAGNOSIS — I6350 Cerebral infarction due to unspecified occlusion or stenosis of unspecified cerebral artery: Secondary | ICD-10-CM

## 2023-11-17 DIAGNOSIS — Z599 Problem related to housing and economic circumstances, unspecified: Secondary | ICD-10-CM

## 2023-11-17 DIAGNOSIS — Z9189 Other specified personal risk factors, not elsewhere classified: Secondary | ICD-10-CM

## 2023-11-17 DIAGNOSIS — Z9109 Other allergy status, other than to drugs and biological substances: Secondary | ICD-10-CM

## 2023-11-17 DIAGNOSIS — K219 Gastro-esophageal reflux disease without esophagitis: Secondary | ICD-10-CM

## 2023-11-17 DIAGNOSIS — M1990 Unspecified osteoarthritis, unspecified site: Secondary | ICD-10-CM

## 2023-11-17 DIAGNOSIS — Z1231 Encounter for screening mammogram for malignant neoplasm of breast: Secondary | ICD-10-CM

## 2023-11-17 DIAGNOSIS — J449 Chronic obstructive pulmonary disease, unspecified: Secondary | ICD-10-CM

## 2023-11-17 DIAGNOSIS — E785 Hyperlipidemia, unspecified: Secondary | ICD-10-CM

## 2023-11-17 DIAGNOSIS — G43909 Migraine, unspecified, not intractable, without status migrainosus: Secondary | ICD-10-CM

## 2023-11-17 DIAGNOSIS — R251 Tremor, unspecified: Secondary | ICD-10-CM

## 2023-11-17 DIAGNOSIS — N3281 Overactive bladder: Secondary | ICD-10-CM

## 2023-11-17 DIAGNOSIS — I2699 Other pulmonary embolism without acute cor pulmonale: Secondary | ICD-10-CM

## 2023-11-17 DIAGNOSIS — IMO0002 Lupus: Secondary | ICD-10-CM

## 2023-11-17 DIAGNOSIS — M3500 Sicca syndrome, unspecified: Secondary | ICD-10-CM

## 2023-11-17 DIAGNOSIS — I8290 Acute embolism and thrombosis of unspecified vein: Secondary | ICD-10-CM

## 2023-11-17 DIAGNOSIS — D649 Anemia, unspecified: Secondary | ICD-10-CM

## 2023-11-17 DIAGNOSIS — Z723 Lack of physical exercise: Secondary | ICD-10-CM

## 2023-11-17 DIAGNOSIS — Z91199 Personal history of noncompliance with medical treatment, presenting hazards to health: Secondary | ICD-10-CM

## 2023-11-17 DIAGNOSIS — Z604 Social exclusion and rejection: Secondary | ICD-10-CM

## 2023-11-17 DIAGNOSIS — I2510 Atherosclerotic heart disease of native coronary artery without angina pectoris: Secondary | ICD-10-CM

## 2023-11-17 DIAGNOSIS — M8580 Other specified disorders of bone density and structure, unspecified site: Secondary | ICD-10-CM

## 2023-11-17 DIAGNOSIS — S0510XA Contusion of eyeball and orbital tissues, unspecified eye, initial encounter: Secondary | ICD-10-CM

## 2023-11-17 DIAGNOSIS — F419 Anxiety disorder, unspecified: Secondary | ICD-10-CM

## 2023-11-17 DIAGNOSIS — I10 Essential (primary) hypertension: Principal | ICD-10-CM

## 2023-11-17 DIAGNOSIS — Z733 Stress, not elsewhere classified: Secondary | ICD-10-CM

## 2023-11-17 DIAGNOSIS — R011 Cardiac murmur, unspecified: Secondary | ICD-10-CM

## 2023-11-17 MED ORDER — PREGABALIN 100 MG PO CAPS
100 mg | Freq: Two times a day (BID) | ORAL | 2 refills | Status: CN
Start: 2023-11-17 — End: ?

## 2023-11-17 MED ORDER — PREGABALIN 100 MG PO CAPS
100 mg | Freq: Two times a day (BID) | ORAL | 2 refills | 30.00000 days | Status: CP
Start: 2023-11-17 — End: ?

## 2023-11-17 MED ORDER — TIRZEPATIDE 15 MG/0.5ML SC SOAJ
15 mg | SUBCUTANEOUS | 2 refills | 28.00000 days | Status: CP
Start: 2023-11-17 — End: ?

## 2023-11-17 MED ORDER — HYDROXYZINE HCL 25 MG PO TABS
25 mg | Freq: Three times a day (TID) | ORAL | 0 refills | Status: CP | PRN
Start: 2023-11-17 — End: ?

## 2023-11-18 DIAGNOSIS — M810 Age-related osteoporosis without current pathological fracture: Principal | ICD-10-CM

## 2023-11-18 MED ORDER — IBANDRONATE SODIUM 150 MG PO TABS
150 mg | ORAL | 3 refills | Status: CP
Start: 2023-11-18 — End: ?

## 2023-11-20 DIAGNOSIS — I10 Essential (primary) hypertension: Principal | ICD-10-CM

## 2023-11-20 MED ORDER — VALSARTAN-HYDROCHLOROTHIAZIDE 320-25 MG PO TABS
1 | ORAL_TABLET | Freq: Every day | ORAL | 3 refills | Status: CP
Start: 2023-11-20 — End: ?

## 2023-11-25 ENCOUNTER — Ambulatory Visit: Payer: Medicare Other | Primary: Internal Medicine

## 2023-11-25 DIAGNOSIS — F4310 Post-traumatic stress disorder, unspecified: Principal | ICD-10-CM

## 2023-11-25 DIAGNOSIS — F331 Major depressive disorder, recurrent, moderate: Secondary | ICD-10-CM

## 2023-11-25 DIAGNOSIS — F329 Major depressive disorder, single episode, unspecified: Secondary | ICD-10-CM

## 2023-11-25 MED ORDER — DULOXETINE HCL 30 MG PO CPEP
30 mg | Freq: Every evening | ORAL | 0 refills | Status: CP
Start: 2023-11-25 — End: ?

## 2023-11-25 MED ORDER — SERTRALINE HCL 50 MG PO TABS
50 mg | Freq: Every day | ORAL | 1 refills | Status: CP
Start: 2023-11-25 — End: ?

## 2023-11-27 DIAGNOSIS — D649 Anemia, unspecified: Secondary | ICD-10-CM

## 2023-11-27 DIAGNOSIS — Z9109 Other allergy status, other than to drugs and biological substances: Secondary | ICD-10-CM

## 2023-11-27 DIAGNOSIS — F32A Depression: Secondary | ICD-10-CM

## 2023-11-27 DIAGNOSIS — Z91199 Personal history of noncompliance with medical treatment, presenting hazards to health: Secondary | ICD-10-CM

## 2023-11-27 DIAGNOSIS — J449 Chronic obstructive pulmonary disease, unspecified: Secondary | ICD-10-CM

## 2023-11-27 DIAGNOSIS — G459 Transient cerebral ischemic attack, unspecified: Secondary | ICD-10-CM

## 2023-11-27 DIAGNOSIS — I2699 Other pulmonary embolism without acute cor pulmonale: Secondary | ICD-10-CM

## 2023-11-27 DIAGNOSIS — F419 Anxiety disorder, unspecified: Secondary | ICD-10-CM

## 2023-11-27 DIAGNOSIS — I6350 Cerebral infarction due to unspecified occlusion or stenosis of unspecified cerebral artery: Secondary | ICD-10-CM

## 2023-11-27 DIAGNOSIS — I8290 Acute embolism and thrombosis of unspecified vein: Secondary | ICD-10-CM

## 2023-11-27 DIAGNOSIS — R251 Tremor, unspecified: Secondary | ICD-10-CM

## 2023-11-27 DIAGNOSIS — N3281 Overactive bladder: Secondary | ICD-10-CM

## 2023-11-27 DIAGNOSIS — Z9189 Other specified personal risk factors, not elsewhere classified: Secondary | ICD-10-CM

## 2023-11-27 DIAGNOSIS — K219 Gastro-esophageal reflux disease without esophagitis: Secondary | ICD-10-CM

## 2023-11-27 DIAGNOSIS — I10 Essential (primary) hypertension: Principal | ICD-10-CM

## 2023-11-27 DIAGNOSIS — E785 Hyperlipidemia, unspecified: Secondary | ICD-10-CM

## 2023-11-27 DIAGNOSIS — I2510 Atherosclerotic heart disease of native coronary artery without angina pectoris: Secondary | ICD-10-CM

## 2023-11-27 DIAGNOSIS — S0510XA Contusion of eyeball and orbital tissues, unspecified eye, initial encounter: Secondary | ICD-10-CM

## 2023-11-27 DIAGNOSIS — M8580 Other specified disorders of bone density and structure, unspecified site: Secondary | ICD-10-CM

## 2023-11-27 DIAGNOSIS — R011 Cardiac murmur, unspecified: Secondary | ICD-10-CM

## 2023-11-27 DIAGNOSIS — G43909 Migraine, unspecified, not intractable, without status migrainosus: Secondary | ICD-10-CM

## 2023-11-27 DIAGNOSIS — M3500 Sicca syndrome, unspecified: Secondary | ICD-10-CM

## 2023-11-27 DIAGNOSIS — M1990 Unspecified osteoarthritis, unspecified site: Secondary | ICD-10-CM

## 2023-11-27 DIAGNOSIS — IMO0002 Lupus: Secondary | ICD-10-CM

## 2023-12-24 ENCOUNTER — Encounter: Primary: Internal Medicine

## 2023-12-24 ENCOUNTER — Encounter: Attending: Internal Medicine | Primary: Internal Medicine

## 2023-12-29 ENCOUNTER — Telehealth: Payer: Medicare Other | Primary: Internal Medicine

## 2023-12-29 DIAGNOSIS — F331 Major depressive disorder, recurrent, moderate: Secondary | ICD-10-CM

## 2023-12-29 DIAGNOSIS — F4310 Post-traumatic stress disorder, unspecified: Principal | ICD-10-CM

## 2023-12-29 MED ORDER — SERTRALINE HCL 100 MG PO TABS
100 mg | Freq: Every day | ORAL | 1 refills | 30.00 days | Status: CP
Start: 2023-12-29 — End: ?

## 2023-12-31 DIAGNOSIS — Z9189 Other specified personal risk factors, not elsewhere classified: Secondary | ICD-10-CM

## 2023-12-31 DIAGNOSIS — G459 Transient cerebral ischemic attack, unspecified: Secondary | ICD-10-CM

## 2023-12-31 DIAGNOSIS — R011 Cardiac murmur, unspecified: Secondary | ICD-10-CM

## 2023-12-31 DIAGNOSIS — F32A Depression: Secondary | ICD-10-CM

## 2023-12-31 DIAGNOSIS — Z91199 Personal history of noncompliance with medical treatment, presenting hazards to health: Secondary | ICD-10-CM

## 2023-12-31 DIAGNOSIS — I6350 Cerebral infarction due to unspecified occlusion or stenosis of unspecified cerebral artery: Secondary | ICD-10-CM

## 2023-12-31 DIAGNOSIS — M1990 Unspecified osteoarthritis, unspecified site: Secondary | ICD-10-CM

## 2023-12-31 DIAGNOSIS — M3500 Sicca syndrome, unspecified: Secondary | ICD-10-CM

## 2023-12-31 DIAGNOSIS — I2699 Other pulmonary embolism without acute cor pulmonale: Secondary | ICD-10-CM

## 2023-12-31 DIAGNOSIS — I8290 Acute embolism and thrombosis of unspecified vein: Secondary | ICD-10-CM

## 2023-12-31 DIAGNOSIS — Z9109 Other allergy status, other than to drugs and biological substances: Secondary | ICD-10-CM

## 2023-12-31 DIAGNOSIS — E785 Hyperlipidemia, unspecified: Secondary | ICD-10-CM

## 2023-12-31 DIAGNOSIS — R251 Tremor, unspecified: Secondary | ICD-10-CM

## 2023-12-31 DIAGNOSIS — F419 Anxiety disorder, unspecified: Secondary | ICD-10-CM

## 2023-12-31 DIAGNOSIS — I2510 Atherosclerotic heart disease of native coronary artery without angina pectoris: Secondary | ICD-10-CM

## 2023-12-31 DIAGNOSIS — D649 Anemia, unspecified: Secondary | ICD-10-CM

## 2023-12-31 DIAGNOSIS — IMO0002 Lupus: Secondary | ICD-10-CM

## 2023-12-31 DIAGNOSIS — G43909 Migraine, unspecified, not intractable, without status migrainosus: Secondary | ICD-10-CM

## 2023-12-31 DIAGNOSIS — K219 Gastro-esophageal reflux disease without esophagitis: Secondary | ICD-10-CM

## 2023-12-31 DIAGNOSIS — N3281 Overactive bladder: Secondary | ICD-10-CM

## 2023-12-31 DIAGNOSIS — M8580 Other specified disorders of bone density and structure, unspecified site: Secondary | ICD-10-CM

## 2023-12-31 DIAGNOSIS — I10 Essential (primary) hypertension: Principal | ICD-10-CM

## 2023-12-31 DIAGNOSIS — J449 Chronic obstructive pulmonary disease, unspecified: Secondary | ICD-10-CM

## 2023-12-31 DIAGNOSIS — S0510XA Contusion of eyeball and orbital tissues, unspecified eye, initial encounter: Secondary | ICD-10-CM

## 2024-01-01 NOTE — Progress Notes (Deleted)
No chief complaint on file.     ASSESSMENT AND PLAN  Michelle Nash is a 58 y.o. female   Chronic migraine headaches  Was previously managed by the neurologist at [place], tried and failed 2 different preventive medication such as Topamax, nortriptyline, Botox injection, and abortive treatment such as Imitrex, Maxalt, Zomig,  Did very well with Aimovig 140 mg monthly injection,  Was treated with Reyvow as abortive treatment, which is serotonin (5-HT) 60F receptor agonist, as needed, which did help her migraine some, but still last 1 to 3 days,  After discussed with patient, decided on continue aimovig, try Ubrelvy as needed  Her migraines are almost always preceded by aura, mental confusion, blurry vision, will add on lamotrigine 25 titrating to 50 mg every night as preventive medication        DIAGNOSTIC DATA (LABS, IMAGING, TESTING) - I reviewed patient records, labs, notes, testing and imaging myself where available.   MEDICAL HISTORY:   Update 01/05/2024 JM:        Consult visit 07/02/2023 Dr. Terrace Arabia: Michelle Nash, is a 58 year old female seen in request by her primary care nurse practitioner Elmer Picker, for evaluation of migraine, initial evaluation July 02, 2023   I reviewed and summarized the referring note. PMHX. HTN Lupus Fibromyogia Hx of bilateral knee replacement DM  She has a long history of migraine, "as long as I can remember", bilateral retro-orbital area severe headache with light noise sensitivity, nausea, vomiting, often proceeding by all worse, such as mental confusion, blurry vision, headache can last 5 to 6 days,  Over the years, she was given different preventive medications, with suboptimal control such as Topamax, nortriptyline, Botox injection, eventually she was treated with aimovig around 2020, responding well, reported 90% improvement  Now she is having migraine headache about once or twice each months, in 2023 was given Reyvow by her  [place] neurologist, which did help her symptoms some, but the headaches still last 1 to 2 days,  Previously she has tried Imitrex, Maxalt, Zomig as needed with suboptimal response,  Personally reviewed MRI of brain in January 2024 that was normal  Laboratory 2024, A1c 5.7, negative HIV, lipid panel, CMP, CBC   PHYSICAL EXAM:   There were no vitals filed for this visit.  Not recorded     There is no height or weight on file to calculate BMI.  PHYSICAL EXAMNIATION:  Gen: NAD, conversant, well nourised, well groomed                     Cardiovascular: Regular rate rhythm, no peripheral edema, warm, nontender. Eyes: Conjunctivae clear without exudates or hemorrhage Neck: Supple, no carotid bruits. Pulmonary: Clear to auscultation bilaterally   NEUROLOGICAL EXAM:  MENTAL STATUS: Speech/cognition: Awake, alert, oriented to history taking and casual conversation CRANIAL NERVES: CN II: Visual fields are full to confrontation. Pupils are round equal and briskly reactive to light. CN III, IV, VI: extraocular movement are normal. No ptosis. CN V: Facial sensation is intact to light touch CN VII: Face is symmetric with normal eye closure  CN VIII: Hearing is normal to causal conversation. CN IX, X: Phonation is normal. CN XI: Head turning and shoulder shrug are intact  MOTOR: There is no pronator drift of out-stretched arms. Muscle bulk and tone are normal. Muscle strength is normal.  REFLEXES: Reflexes are 2+ and symmetric at the biceps, triceps, knees, and ankles. Plantar responses are flexor.  SENSORY: Intact to light touch, pinprick and vibratory  sensation are intact in fingers and toes.  COORDINATION: There is no trunk or limb dysmetria noted.  GAIT/STANCE: Posture is normal. Gait is steady with normal steps, base, arm swing, and turning. Heel and toe walking are normal. Tandem gait is normal.  Romberg is absent.  REVIEW OF SYSTEMS:  Full 14 system review of  systems performed and notable only for as above All other review of systems were negative.   ALLERGIES: Allergies  Allergen Reactions   Lisinopril Cough    ACE cough   Tylenol [Acetaminophen] Nausea And Vomiting    HOME MEDICATIONS: Current Outpatient Medications  Medication Sig Dispense Refill   aspirin EC 81 MG EC tablet Take 1 tablet (81 mg total) by mouth daily. Swallow whole. (Patient not taking: Reported on 12/31/2022) 30 tablet 11   azaTHIOprine (IMURAN) 50 MG tablet Take 50 mg by mouth daily.     benzonatate (TESSALON) 100 MG capsule Take 1 capsule (100 mg total) by mouth 3 (three) times daily as needed for cough. (Patient not taking: Reported on 12/31/2022) 15 capsule 0   cyclobenzaprine (FLEXERIL) 10 MG tablet Take 1 tablet by mouth at bedtime.     cycloSPORINE (RESTASIS) 0.05 % ophthalmic emulsion Place 1 drop into both eyes 2 (two) times daily.     diltiazem (CARDIZEM CD) 180 MG 24 hr capsule Take 180 mg by mouth every evening.     DULoxetine (CYMBALTA) 30 MG capsule Take 30 mg by mouth 2 (two) times daily.     Erenumab-aooe (AIMOVIG) 140 MG/ML SOAJ Inject 140 mg into the skin every 30 (thirty) days. 3 mL 3   hydroxychloroquine (PLAQUENIL) 200 MG tablet Take 200 mg by mouth 2 (two) times daily.     ibuprofen (ADVIL) 600 MG tablet Take 1 tablet (600 mg total) by mouth every 6 (six) hours as needed. (Patient not taking: Reported on 07/02/2023) 30 tablet 0   lamoTRIgine (LAMICTAL) 25 MG tablet One tab at bedtime xone week, then 2 tabs qhs 60 tablet 11   MOUNJARO 2.5 MG/0.5ML Pen Inject 2.5 mg into the skin once a week. (Patient not taking: Reported on 12/31/2022)     MOUNJARO 5 MG/0.5ML Pen Inject 10 mg into the skin once a week.     oxyCODONE (ROXICODONE) 5 MG immediate release tablet Take 1 tablet (5 mg total) by mouth every 6 (six) hours as needed for severe pain. 8 tablet 0   pilocarpine (SALAGEN) 5 MG tablet Take 5 mg by mouth daily as needed (Dry mouth).     predniSONE  (DELTASONE) 5 MG tablet Take 5 mg by mouth daily with breakfast.     pregabalin (LYRICA) 100 MG capsule Take 100 mg by mouth 2 (two) times daily.     promethazine-dextromethorphan (PROMETHAZINE-DM) 6.25-15 MG/5ML syrup Take 5 mLs by mouth 4 (four) times daily as needed for cough. (Patient not taking: Reported on 12/31/2022) 118 mL 0   Ubrogepant (UBRELVY) 100 MG TABS Take 1-2 tablets 30 minutes prior to MRI, may repeat once as needed. Must have driver. 12 tablet 6   valsartan-hydrochlorothiazide (DIOVAN-HCT) 320-25 MG tablet Take 1 tablet by mouth daily.     No current facility-administered medications for this visit.    PAST MEDICAL HISTORY: Past Medical History:  Diagnosis Date   Coronary artery spasm (HCC)    Fibromyalgia    Lupus (HCC)    Osteoarthritis    Pulmonary emboli (HCC)    Sjogren's disease (HCC)    Stroke (HCC)  PAST SURGICAL HISTORY: Past Surgical History:  Procedure Laterality Date   CESAREAN SECTION     CHOLECYSTECTOMY      FAMILY HISTORY: Family History  Problem Relation Age of Onset   Heart disease Mother    Stroke Mother    Hypertension Mother     SOCIAL HISTORY: Social History   Socioeconomic History   Marital status: Single    Spouse name: Not on file   Number of children: Not on file   Years of education: Not on file   Highest education level: Not on file  Occupational History   Not on file  Tobacco Use   Smoking status: Never   Smokeless tobacco: Never  Vaping Use   Vaping status: Never Used  Substance and Sexual Activity   Alcohol use: Never   Drug use: Never   Sexual activity: Not Currently    Birth control/protection: None  Other Topics Concern   Not on file  Social History Narrative   Right handed   Caffeine- 1 cup daily   Lives alone   Social Drivers of Health   Financial Resource Strain: High Risk (01/06/2023)   [facility]   Overall Financial Resource Strain (CARDIA)    Difficulty of Paying  Living Expenses: Hard  Food Insecurity: No Food Insecurity (01/06/2023)   [facility]   Hunger Vital Sign    Worried About Running Out of Food in the Last Year: Never true    Ran Out of Food in the Last Year: Never true  Transportation Needs: No Transportation Needs (01/06/2023)   [facility]   PRAPARE - Transportation    Lack of Transportation (Medical): No    Lack of Transportation (Non-Medical): No  Physical Activity: Inactive (01/06/2023)   [facility]   Exercise Vital Sign    Days of Exercise per Week: 0 days    Minutes of Exercise per Session: 0 min  Stress: Stress Concern Present (01/06/2023)   [facility]   [employer] of Occupational Health - Occupational Stress Questionnaire    Feeling of Stress : Very much  Social Connections: Moderately Isolated (01/06/2023)   [facility]   Social Connection and Isolation Panel [NHANES]    Frequency of Communication with Friends and Family: More than three times a week    Frequency of Social Gatherings with Friends and Family: Once a week    Attends Religious Services: More than 4 times per year    Active Member of [employer] or Organizations: No    Attends Banker Meetings: Never    Marital Status: Never married  Intimate Partner Violence: Unknown (03/21/2022)   Received from [employer]   HITS    Physically Hurt: Not on file    Insult or Talk Down To: Not on file    Threaten Physical Harm: Not on file    Scream or Curse: Not on file     I spent *** minutes of face-to-face and non-face-to-face time with patient.  This included previsit chart review, lab review, study review, order entry, electronic health record documentation, patient education and discussion regarding above diagnoses and treatment plan and answered all other questions to patient's satisfaction  Ihor Austin,  [facility]  [facility] [address] [place], [place] [address]  Phone [phone] Fax [phone] Note: This document was prepared with digital dictation  and possible smart Lobbyist. Any transcriptional errors that result from this process are unintentional.

## 2024-01-05 ENCOUNTER — Ambulatory Visit: Payer: 59 | Admitting: Adult Health

## 2024-01-05 ENCOUNTER — Encounter: Payer: Self-pay | Admitting: Adult Health

## 2024-01-26 ENCOUNTER — Telehealth: Payer: Medicare Other | Primary: Internal Medicine

## 2024-01-26 DIAGNOSIS — F4310 Post-traumatic stress disorder, unspecified: Principal | ICD-10-CM

## 2024-01-26 DIAGNOSIS — F331 Major depressive disorder, recurrent, moderate: Secondary | ICD-10-CM

## 2024-01-26 MED ORDER — CLONAZEPAM 0.5 MG PO TABS
.5 mg | Freq: Every evening | ORAL | 0 refills | 30.00 days | Status: CP | PRN
Start: 2024-01-26 — End: ?

## 2024-01-26 MED ORDER — SERTRALINE HCL 100 MG PO TABS
150 mg | Freq: Every day | ORAL | 1 refills | 30.00000 days | Status: CP
Start: 2024-01-26 — End: ?

## 2024-01-27 ENCOUNTER — Ambulatory Visit: Payer: Medicare Other | Primary: Internal Medicine

## 2024-01-27 DIAGNOSIS — M8580 Other specified disorders of bone density and structure, unspecified site: Secondary | ICD-10-CM

## 2024-01-27 DIAGNOSIS — I2699 Other pulmonary embolism without acute cor pulmonale: Secondary | ICD-10-CM

## 2024-01-27 DIAGNOSIS — M1990 Unspecified osteoarthritis, unspecified site: Secondary | ICD-10-CM

## 2024-01-27 DIAGNOSIS — G43909 Migraine, unspecified, not intractable, without status migrainosus: Secondary | ICD-10-CM

## 2024-01-27 DIAGNOSIS — J449 Chronic obstructive pulmonary disease, unspecified: Secondary | ICD-10-CM

## 2024-01-27 DIAGNOSIS — G4700 Insomnia, unspecified: Secondary | ICD-10-CM

## 2024-01-27 DIAGNOSIS — E7800 Pure hypercholesterolemia, unspecified: Secondary | ICD-10-CM

## 2024-01-27 DIAGNOSIS — F32A Depression: Secondary | ICD-10-CM

## 2024-01-27 DIAGNOSIS — R251 Tremor, unspecified: Secondary | ICD-10-CM

## 2024-01-27 DIAGNOSIS — E785 Hyperlipidemia, unspecified: Secondary | ICD-10-CM

## 2024-01-27 DIAGNOSIS — R011 Cardiac murmur, unspecified: Secondary | ICD-10-CM

## 2024-01-27 DIAGNOSIS — M797 Fibromyalgia: Secondary | ICD-10-CM

## 2024-01-27 DIAGNOSIS — K219 Gastro-esophageal reflux disease without esophagitis: Secondary | ICD-10-CM

## 2024-01-27 DIAGNOSIS — IMO0002 Lupus: Secondary | ICD-10-CM

## 2024-01-27 DIAGNOSIS — F322 Major depressive disorder, single episode, severe without psychotic features: Secondary | ICD-10-CM

## 2024-01-27 DIAGNOSIS — I2510 Atherosclerotic heart disease of native coronary artery without angina pectoris: Secondary | ICD-10-CM

## 2024-01-27 DIAGNOSIS — I959 Hypotension, unspecified: Secondary | ICD-10-CM

## 2024-01-27 DIAGNOSIS — Z9189 Other specified personal risk factors, not elsewhere classified: Secondary | ICD-10-CM

## 2024-01-27 DIAGNOSIS — M3501 Sicca syndrome with keratoconjunctivitis: Secondary | ICD-10-CM

## 2024-01-27 DIAGNOSIS — I8290 Acute embolism and thrombosis of unspecified vein: Secondary | ICD-10-CM

## 2024-01-27 DIAGNOSIS — D649 Anemia, unspecified: Secondary | ICD-10-CM

## 2024-01-27 DIAGNOSIS — G894 Chronic pain syndrome: Secondary | ICD-10-CM

## 2024-01-27 DIAGNOSIS — F419 Anxiety disorder, unspecified: Secondary | ICD-10-CM

## 2024-01-27 DIAGNOSIS — S0510XA Contusion of eyeball and orbital tissues, unspecified eye, initial encounter: Secondary | ICD-10-CM

## 2024-01-27 DIAGNOSIS — N3281 Overactive bladder: Secondary | ICD-10-CM

## 2024-01-27 DIAGNOSIS — M3500 Sicca syndrome, unspecified: Secondary | ICD-10-CM

## 2024-01-27 DIAGNOSIS — Z91199 Personal history of noncompliance with medical treatment, presenting hazards to health: Secondary | ICD-10-CM

## 2024-01-27 DIAGNOSIS — Z6833 Body mass index (BMI) 33.0-33.9, adult: Principal | ICD-10-CM

## 2024-01-27 DIAGNOSIS — I6350 Cerebral infarction due to unspecified occlusion or stenosis of unspecified cerebral artery: Secondary | ICD-10-CM

## 2024-01-27 DIAGNOSIS — Z9109 Other allergy status, other than to drugs and biological substances: Secondary | ICD-10-CM

## 2024-01-27 DIAGNOSIS — I10 Essential (primary) hypertension: Principal | ICD-10-CM

## 2024-01-27 DIAGNOSIS — G459 Transient cerebral ischemic attack, unspecified: Secondary | ICD-10-CM

## 2024-01-27 MED ORDER — ARIPIPRAZOLE 2 MG PO TABS
2 mg | Freq: Every day | ORAL
Start: 2024-01-27 — End: ?

## 2024-01-28 DIAGNOSIS — K219 Gastro-esophageal reflux disease without esophagitis: Secondary | ICD-10-CM

## 2024-01-28 DIAGNOSIS — D649 Anemia, unspecified: Secondary | ICD-10-CM

## 2024-01-28 DIAGNOSIS — M1990 Unspecified osteoarthritis, unspecified site: Secondary | ICD-10-CM

## 2024-01-28 DIAGNOSIS — Z9189 Other specified personal risk factors, not elsewhere classified: Secondary | ICD-10-CM

## 2024-01-28 DIAGNOSIS — Z91199 Personal history of noncompliance with medical treatment, presenting hazards to health: Secondary | ICD-10-CM

## 2024-01-28 DIAGNOSIS — I10 Essential (primary) hypertension: Principal | ICD-10-CM

## 2024-01-28 DIAGNOSIS — G43909 Migraine, unspecified, not intractable, without status migrainosus: Secondary | ICD-10-CM

## 2024-01-28 DIAGNOSIS — Z9109 Other allergy status, other than to drugs and biological substances: Secondary | ICD-10-CM

## 2024-01-28 DIAGNOSIS — H6993 Unspecified Eustachian tube disorder, bilateral: Principal | ICD-10-CM

## 2024-01-28 DIAGNOSIS — N3281 Overactive bladder: Secondary | ICD-10-CM

## 2024-01-28 DIAGNOSIS — M8580 Other specified disorders of bone density and structure, unspecified site: Secondary | ICD-10-CM

## 2024-01-28 DIAGNOSIS — I2699 Other pulmonary embolism without acute cor pulmonale: Secondary | ICD-10-CM

## 2024-01-28 DIAGNOSIS — F32A Depression: Secondary | ICD-10-CM

## 2024-01-28 DIAGNOSIS — R251 Tremor, unspecified: Secondary | ICD-10-CM

## 2024-01-28 DIAGNOSIS — G459 Transient cerebral ischemic attack, unspecified: Secondary | ICD-10-CM

## 2024-01-28 DIAGNOSIS — J449 Chronic obstructive pulmonary disease, unspecified: Secondary | ICD-10-CM

## 2024-01-28 DIAGNOSIS — I8290 Acute embolism and thrombosis of unspecified vein: Secondary | ICD-10-CM

## 2024-01-28 DIAGNOSIS — R011 Cardiac murmur, unspecified: Secondary | ICD-10-CM

## 2024-01-28 DIAGNOSIS — F419 Anxiety disorder, unspecified: Secondary | ICD-10-CM

## 2024-01-28 DIAGNOSIS — I2510 Atherosclerotic heart disease of native coronary artery without angina pectoris: Secondary | ICD-10-CM

## 2024-01-28 DIAGNOSIS — I6350 Cerebral infarction due to unspecified occlusion or stenosis of unspecified cerebral artery: Secondary | ICD-10-CM

## 2024-01-28 DIAGNOSIS — E785 Hyperlipidemia, unspecified: Secondary | ICD-10-CM

## 2024-01-28 DIAGNOSIS — S0510XA Contusion of eyeball and orbital tissues, unspecified eye, initial encounter: Secondary | ICD-10-CM

## 2024-01-28 DIAGNOSIS — M3500 Sicca syndrome, unspecified: Secondary | ICD-10-CM

## 2024-01-28 DIAGNOSIS — IMO0002 Lupus: Secondary | ICD-10-CM

## 2024-01-29 MED ORDER — FLUTICASONE PROPIONATE 50 MCG/ACT NA SUSP
1 | Freq: Every day | NASAL | 1 refills | 30.00000 days | Status: CP
Start: 2024-01-29 — End: ?

## 2024-02-02 ENCOUNTER — Ambulatory Visit: Payer: Medicare Other | Attending: Internal Medicine | Primary: Internal Medicine

## 2024-02-02 DIAGNOSIS — F32A Depression: Secondary | ICD-10-CM

## 2024-02-02 DIAGNOSIS — I8290 Acute embolism and thrombosis of unspecified vein: Secondary | ICD-10-CM

## 2024-02-02 DIAGNOSIS — M3500 Sicca syndrome, unspecified: Secondary | ICD-10-CM

## 2024-02-02 DIAGNOSIS — K219 Gastro-esophageal reflux disease without esophagitis: Secondary | ICD-10-CM

## 2024-02-02 DIAGNOSIS — D649 Anemia, unspecified: Secondary | ICD-10-CM

## 2024-02-02 DIAGNOSIS — I2699 Other pulmonary embolism without acute cor pulmonale: Secondary | ICD-10-CM

## 2024-02-02 DIAGNOSIS — R011 Cardiac murmur, unspecified: Secondary | ICD-10-CM

## 2024-02-02 DIAGNOSIS — J449 Chronic obstructive pulmonary disease, unspecified: Secondary | ICD-10-CM

## 2024-02-02 DIAGNOSIS — Z6834 Body mass index (BMI) 34.0-34.9, adult: Principal | ICD-10-CM

## 2024-02-02 DIAGNOSIS — Z9189 Other specified personal risk factors, not elsewhere classified: Secondary | ICD-10-CM

## 2024-02-02 DIAGNOSIS — F419 Anxiety disorder, unspecified: Secondary | ICD-10-CM

## 2024-02-02 DIAGNOSIS — IMO0002 Lupus: Secondary | ICD-10-CM

## 2024-02-02 DIAGNOSIS — Z9109 Other allergy status, other than to drugs and biological substances: Secondary | ICD-10-CM

## 2024-02-02 DIAGNOSIS — M1990 Unspecified osteoarthritis, unspecified site: Secondary | ICD-10-CM

## 2024-02-02 DIAGNOSIS — I2510 Atherosclerotic heart disease of native coronary artery without angina pectoris: Secondary | ICD-10-CM

## 2024-02-02 DIAGNOSIS — Z91199 Personal history of noncompliance with medical treatment, presenting hazards to health: Secondary | ICD-10-CM

## 2024-02-02 DIAGNOSIS — R251 Tremor, unspecified: Secondary | ICD-10-CM

## 2024-02-02 DIAGNOSIS — N3281 Overactive bladder: Secondary | ICD-10-CM

## 2024-02-02 DIAGNOSIS — E785 Hyperlipidemia, unspecified: Secondary | ICD-10-CM

## 2024-02-02 DIAGNOSIS — M8580 Other specified disorders of bone density and structure, unspecified site: Secondary | ICD-10-CM

## 2024-02-02 DIAGNOSIS — I6350 Cerebral infarction due to unspecified occlusion or stenosis of unspecified cerebral artery: Secondary | ICD-10-CM

## 2024-02-02 DIAGNOSIS — G43909 Migraine, unspecified, not intractable, without status migrainosus: Secondary | ICD-10-CM

## 2024-02-02 DIAGNOSIS — G459 Transient cerebral ischemic attack, unspecified: Secondary | ICD-10-CM

## 2024-02-02 DIAGNOSIS — I10 Essential (primary) hypertension: Secondary | ICD-10-CM

## 2024-02-02 DIAGNOSIS — S0510XA Contusion of eyeball and orbital tissues, unspecified eye, initial encounter: Secondary | ICD-10-CM

## 2024-02-09 ENCOUNTER — Encounter: Payer: Medicare Other | Primary: Internal Medicine

## 2024-02-12 ENCOUNTER — Telehealth: Payer: Medicare Other | Attending: Mental Health | Primary: Internal Medicine

## 2024-02-12 DIAGNOSIS — D649 Anemia, unspecified: Secondary | ICD-10-CM

## 2024-02-12 DIAGNOSIS — M8580 Other specified disorders of bone density and structure, unspecified site: Secondary | ICD-10-CM

## 2024-02-12 DIAGNOSIS — I2699 Other pulmonary embolism without acute cor pulmonale: Secondary | ICD-10-CM

## 2024-02-12 DIAGNOSIS — E785 Hyperlipidemia, unspecified: Secondary | ICD-10-CM

## 2024-02-12 DIAGNOSIS — F419 Anxiety disorder, unspecified: Secondary | ICD-10-CM

## 2024-02-12 DIAGNOSIS — I2510 Atherosclerotic heart disease of native coronary artery without angina pectoris: Secondary | ICD-10-CM

## 2024-02-12 DIAGNOSIS — R251 Tremor, unspecified: Secondary | ICD-10-CM

## 2024-02-12 DIAGNOSIS — R011 Cardiac murmur, unspecified: Secondary | ICD-10-CM

## 2024-02-12 DIAGNOSIS — S0510XA Contusion of eyeball and orbital tissues, unspecified eye, initial encounter: Secondary | ICD-10-CM

## 2024-02-12 DIAGNOSIS — I10 Essential (primary) hypertension: Principal | ICD-10-CM

## 2024-02-12 DIAGNOSIS — M1990 Unspecified osteoarthritis, unspecified site: Secondary | ICD-10-CM

## 2024-02-12 DIAGNOSIS — I6350 Cerebral infarction due to unspecified occlusion or stenosis of unspecified cerebral artery: Secondary | ICD-10-CM

## 2024-02-12 DIAGNOSIS — F331 Major depressive disorder, recurrent, moderate: Secondary | ICD-10-CM

## 2024-02-12 DIAGNOSIS — K219 Gastro-esophageal reflux disease without esophagitis: Secondary | ICD-10-CM

## 2024-02-12 DIAGNOSIS — F4310 Post-traumatic stress disorder, unspecified: Principal | ICD-10-CM

## 2024-02-12 DIAGNOSIS — N3281 Overactive bladder: Secondary | ICD-10-CM

## 2024-02-12 DIAGNOSIS — Z9189 Other specified personal risk factors, not elsewhere classified: Secondary | ICD-10-CM

## 2024-02-12 DIAGNOSIS — J449 Chronic obstructive pulmonary disease, unspecified: Secondary | ICD-10-CM

## 2024-02-12 DIAGNOSIS — IMO0002 Lupus: Secondary | ICD-10-CM

## 2024-02-12 DIAGNOSIS — G43909 Migraine, unspecified, not intractable, without status migrainosus: Secondary | ICD-10-CM

## 2024-02-12 DIAGNOSIS — F32A Depression: Secondary | ICD-10-CM

## 2024-02-12 DIAGNOSIS — M3500 Sicca syndrome, unspecified: Secondary | ICD-10-CM

## 2024-02-12 DIAGNOSIS — G459 Transient cerebral ischemic attack, unspecified: Secondary | ICD-10-CM

## 2024-02-12 DIAGNOSIS — I8290 Acute embolism and thrombosis of unspecified vein: Secondary | ICD-10-CM

## 2024-02-12 DIAGNOSIS — Z9109 Other allergy status, other than to drugs and biological substances: Secondary | ICD-10-CM

## 2024-02-12 DIAGNOSIS — Z91199 Personal history of noncompliance with medical treatment, presenting hazards to health: Secondary | ICD-10-CM

## 2024-02-21 DIAGNOSIS — M797 Fibromyalgia: Principal | ICD-10-CM

## 2024-02-23 ENCOUNTER — Ambulatory Visit: Primary: Internal Medicine

## 2024-02-23 DIAGNOSIS — F4310 Post-traumatic stress disorder, unspecified: Principal | ICD-10-CM

## 2024-02-23 DIAGNOSIS — F331 Major depressive disorder, recurrent, moderate: Secondary | ICD-10-CM

## 2024-02-23 MED ORDER — PREGABALIN 100 MG PO CAPS
100 mg | Freq: Two times a day (BID) | ORAL | 2 refills | 30.00000 days | Status: CP
Start: 2024-02-23 — End: ?

## 2024-02-23 MED ORDER — CLONAZEPAM 0.5 MG PO TABS
.5 mg | Freq: Every evening | ORAL | 0 refills | Status: CP | PRN
Start: 2024-02-23 — End: ?

## 2024-02-23 MED ORDER — SERTRALINE HCL 100 MG PO TABS
200 mg | Freq: Every day | ORAL | 1 refills | 45.00 days | Status: CP
Start: 2024-02-23 — End: ?

## 2024-02-24 ENCOUNTER — Encounter: Attending: Mental Health | Primary: Internal Medicine

## 2024-02-25 DIAGNOSIS — R011 Cardiac murmur, unspecified: Secondary | ICD-10-CM

## 2024-02-25 DIAGNOSIS — Z9189 Other specified personal risk factors, not elsewhere classified: Secondary | ICD-10-CM

## 2024-02-25 DIAGNOSIS — R251 Tremor, unspecified: Secondary | ICD-10-CM

## 2024-02-25 DIAGNOSIS — G43909 Migraine, unspecified, not intractable, without status migrainosus: Secondary | ICD-10-CM

## 2024-02-25 DIAGNOSIS — N3281 Overactive bladder: Secondary | ICD-10-CM

## 2024-02-25 DIAGNOSIS — M3500 Sicca syndrome, unspecified: Secondary | ICD-10-CM

## 2024-02-25 DIAGNOSIS — I10 Essential (primary) hypertension: Principal | ICD-10-CM

## 2024-02-25 DIAGNOSIS — E785 Hyperlipidemia, unspecified: Secondary | ICD-10-CM

## 2024-02-25 DIAGNOSIS — Z91199 Personal history of noncompliance with medical treatment, presenting hazards to health: Secondary | ICD-10-CM

## 2024-02-25 DIAGNOSIS — I2699 Other pulmonary embolism without acute cor pulmonale: Secondary | ICD-10-CM

## 2024-02-25 DIAGNOSIS — G459 Transient cerebral ischemic attack, unspecified: Secondary | ICD-10-CM

## 2024-02-25 DIAGNOSIS — F32A Depression: Secondary | ICD-10-CM

## 2024-02-25 DIAGNOSIS — I6350 Cerebral infarction due to unspecified occlusion or stenosis of unspecified cerebral artery: Secondary | ICD-10-CM

## 2024-02-25 DIAGNOSIS — I2510 Atherosclerotic heart disease of native coronary artery without angina pectoris: Secondary | ICD-10-CM

## 2024-02-25 DIAGNOSIS — F419 Anxiety disorder, unspecified: Secondary | ICD-10-CM

## 2024-02-25 DIAGNOSIS — S0510XA Contusion of eyeball and orbital tissues, unspecified eye, initial encounter: Secondary | ICD-10-CM

## 2024-02-25 DIAGNOSIS — M1990 Unspecified osteoarthritis, unspecified site: Secondary | ICD-10-CM

## 2024-02-25 DIAGNOSIS — M8580 Other specified disorders of bone density and structure, unspecified site: Secondary | ICD-10-CM

## 2024-02-25 DIAGNOSIS — J449 Chronic obstructive pulmonary disease, unspecified: Secondary | ICD-10-CM

## 2024-02-25 DIAGNOSIS — IMO0002 Lupus: Secondary | ICD-10-CM

## 2024-02-25 DIAGNOSIS — I8290 Acute embolism and thrombosis of unspecified vein: Secondary | ICD-10-CM

## 2024-02-25 DIAGNOSIS — Z9109 Other allergy status, other than to drugs and biological substances: Secondary | ICD-10-CM

## 2024-02-25 DIAGNOSIS — D649 Anemia, unspecified: Secondary | ICD-10-CM

## 2024-02-25 DIAGNOSIS — K219 Gastro-esophageal reflux disease without esophagitis: Secondary | ICD-10-CM

## 2024-02-26 ENCOUNTER — Telehealth: Attending: Mental Health | Primary: Internal Medicine

## 2024-02-26 DIAGNOSIS — F4310 Post-traumatic stress disorder, unspecified: Principal | ICD-10-CM

## 2024-02-26 DIAGNOSIS — I10 Essential (primary) hypertension: Principal | ICD-10-CM

## 2024-02-26 DIAGNOSIS — D649 Anemia, unspecified: Secondary | ICD-10-CM

## 2024-02-26 DIAGNOSIS — M3500 Sicca syndrome, unspecified: Secondary | ICD-10-CM

## 2024-02-26 DIAGNOSIS — E785 Hyperlipidemia, unspecified: Secondary | ICD-10-CM

## 2024-02-26 DIAGNOSIS — I2510 Atherosclerotic heart disease of native coronary artery without angina pectoris: Secondary | ICD-10-CM

## 2024-02-26 DIAGNOSIS — J449 Chronic obstructive pulmonary disease, unspecified: Secondary | ICD-10-CM

## 2024-02-26 DIAGNOSIS — F331 Major depressive disorder, recurrent, moderate: Secondary | ICD-10-CM

## 2024-02-26 DIAGNOSIS — I6350 Cerebral infarction due to unspecified occlusion or stenosis of unspecified cerebral artery: Secondary | ICD-10-CM

## 2024-02-26 DIAGNOSIS — R011 Cardiac murmur, unspecified: Secondary | ICD-10-CM

## 2024-02-26 DIAGNOSIS — I8290 Acute embolism and thrombosis of unspecified vein: Secondary | ICD-10-CM

## 2024-02-26 DIAGNOSIS — S0510XA Contusion of eyeball and orbital tissues, unspecified eye, initial encounter: Secondary | ICD-10-CM

## 2024-02-26 DIAGNOSIS — G43909 Migraine, unspecified, not intractable, without status migrainosus: Secondary | ICD-10-CM

## 2024-02-26 DIAGNOSIS — Z9189 Other specified personal risk factors, not elsewhere classified: Secondary | ICD-10-CM

## 2024-02-26 DIAGNOSIS — Z9109 Other allergy status, other than to drugs and biological substances: Secondary | ICD-10-CM

## 2024-02-26 DIAGNOSIS — K219 Gastro-esophageal reflux disease without esophagitis: Secondary | ICD-10-CM

## 2024-02-26 DIAGNOSIS — G459 Transient cerebral ischemic attack, unspecified: Secondary | ICD-10-CM

## 2024-02-26 DIAGNOSIS — F32A Depression: Secondary | ICD-10-CM

## 2024-02-26 DIAGNOSIS — N3281 Overactive bladder: Secondary | ICD-10-CM

## 2024-02-26 DIAGNOSIS — R251 Tremor, unspecified: Secondary | ICD-10-CM

## 2024-02-26 DIAGNOSIS — Z91199 Personal history of noncompliance with medical treatment, presenting hazards to health: Secondary | ICD-10-CM

## 2024-02-26 DIAGNOSIS — M1990 Unspecified osteoarthritis, unspecified site: Secondary | ICD-10-CM

## 2024-02-26 DIAGNOSIS — I2699 Other pulmonary embolism without acute cor pulmonale: Secondary | ICD-10-CM

## 2024-02-26 DIAGNOSIS — M8580 Other specified disorders of bone density and structure, unspecified site: Secondary | ICD-10-CM

## 2024-02-26 DIAGNOSIS — IMO0002 Lupus: Secondary | ICD-10-CM

## 2024-02-26 DIAGNOSIS — F419 Anxiety disorder, unspecified: Secondary | ICD-10-CM

## 2024-03-01 DIAGNOSIS — Z6836 Body mass index (BMI) 36.0-36.9, adult: Principal | ICD-10-CM

## 2024-03-01 MED ORDER — HYDROXYZINE HCL 25 MG PO TABS
25 mg | Freq: Three times a day (TID) | ORAL | 0 refills | Status: CP | PRN
Start: 2024-03-01 — End: ?

## 2024-03-01 MED ORDER — MOUNJARO 15 MG/0.5ML SC SOAJ
2 refills | Status: CP
Start: 2024-03-01 — End: ?

## 2024-03-06 DIAGNOSIS — M797 Fibromyalgia: Principal | ICD-10-CM

## 2024-03-08 MED ORDER — CYCLOBENZAPRINE HCL 10 MG PO TABS
10 mg | Freq: Every evening | ORAL | 1 refills | Status: CP
Start: 2024-03-08 — End: ?

## 2024-03-26 ENCOUNTER — Encounter: Payer: MEDICARE | Primary: Internal Medicine

## 2024-03-26 DIAGNOSIS — M797 Fibromyalgia: Principal | ICD-10-CM

## 2024-03-26 MED ORDER — ARIPIPRAZOLE 2 MG PO TABS
2 mg | Freq: Every day | ORAL | 3 refills | Status: CP
Start: 2024-03-26 — End: ?

## 2024-04-01 ENCOUNTER — Telehealth: Payer: MEDICARE | Attending: Mental Health | Primary: Internal Medicine

## 2024-04-01 DIAGNOSIS — F4310 Post-traumatic stress disorder, unspecified: Principal | ICD-10-CM

## 2024-04-05 ENCOUNTER — Telehealth: Payer: MEDICARE | Primary: Internal Medicine

## 2024-04-05 DIAGNOSIS — S0510XA Contusion of eyeball and orbital tissues, unspecified eye, initial encounter: Secondary | ICD-10-CM

## 2024-04-05 DIAGNOSIS — N3281 Overactive bladder: Secondary | ICD-10-CM

## 2024-04-05 DIAGNOSIS — I6350 Cerebral infarction due to unspecified occlusion or stenosis of unspecified cerebral artery: Secondary | ICD-10-CM

## 2024-04-05 DIAGNOSIS — I2510 Atherosclerotic heart disease of native coronary artery without angina pectoris: Secondary | ICD-10-CM

## 2024-04-05 DIAGNOSIS — E785 Hyperlipidemia, unspecified: Secondary | ICD-10-CM

## 2024-04-05 DIAGNOSIS — M3500 Sicca syndrome, unspecified: Secondary | ICD-10-CM

## 2024-04-05 DIAGNOSIS — I8290 Acute embolism and thrombosis of unspecified vein: Secondary | ICD-10-CM

## 2024-04-05 DIAGNOSIS — F419 Anxiety disorder, unspecified: Secondary | ICD-10-CM

## 2024-04-05 DIAGNOSIS — M8580 Other specified disorders of bone density and structure, unspecified site: Secondary | ICD-10-CM

## 2024-04-05 DIAGNOSIS — F32A Depression: Secondary | ICD-10-CM

## 2024-04-05 DIAGNOSIS — R011 Cardiac murmur, unspecified: Secondary | ICD-10-CM

## 2024-04-05 DIAGNOSIS — J449 Chronic obstructive pulmonary disease, unspecified: Secondary | ICD-10-CM

## 2024-04-05 DIAGNOSIS — D649 Anemia, unspecified: Secondary | ICD-10-CM

## 2024-04-05 DIAGNOSIS — I2699 Other pulmonary embolism without acute cor pulmonale: Secondary | ICD-10-CM

## 2024-04-05 DIAGNOSIS — G459 Transient cerebral ischemic attack, unspecified: Secondary | ICD-10-CM

## 2024-04-05 DIAGNOSIS — F4310 Post-traumatic stress disorder, unspecified: Principal | ICD-10-CM

## 2024-04-05 DIAGNOSIS — K219 Gastro-esophageal reflux disease without esophagitis: Secondary | ICD-10-CM

## 2024-04-05 DIAGNOSIS — R251 Tremor, unspecified: Secondary | ICD-10-CM

## 2024-04-05 DIAGNOSIS — Z9109 Other allergy status, other than to drugs and biological substances: Secondary | ICD-10-CM

## 2024-04-05 DIAGNOSIS — IMO0002 Lupus: Secondary | ICD-10-CM

## 2024-04-05 DIAGNOSIS — I10 Essential (primary) hypertension: Principal | ICD-10-CM

## 2024-04-05 DIAGNOSIS — M1990 Unspecified osteoarthritis, unspecified site: Secondary | ICD-10-CM

## 2024-04-05 DIAGNOSIS — Z9189 Other specified personal risk factors, not elsewhere classified: Secondary | ICD-10-CM

## 2024-04-05 DIAGNOSIS — G43909 Migraine, unspecified, not intractable, without status migrainosus: Secondary | ICD-10-CM

## 2024-04-05 DIAGNOSIS — F331 Major depressive disorder, recurrent, moderate: Secondary | ICD-10-CM

## 2024-04-05 DIAGNOSIS — Z91199 Personal history of noncompliance with medical treatment, presenting hazards to health: Secondary | ICD-10-CM

## 2024-04-05 MED ORDER — SERTRALINE HCL 100 MG PO TABS
200 mg | Freq: Every day | ORAL | 0 refills | 45.00 days | Status: CP
Start: 2024-04-05 — End: ?

## 2024-04-05 MED ORDER — CLONAZEPAM 0.5 MG PO TABS
.5 mg | Freq: Every evening | ORAL | 0 refills | 30.00 days | Status: CP | PRN
Start: 2024-04-05 — End: ?

## 2024-04-07 MED ORDER — HYDROXYZINE HCL 25 MG PO TABS
25 mg | Freq: Three times a day (TID) | ORAL | 1 refills | Status: CP | PRN
Start: 2024-04-07 — End: ?

## 2024-04-14 ENCOUNTER — Ambulatory Visit: Payer: MEDICARE | Primary: Internal Medicine

## 2024-04-14 DIAGNOSIS — M1990 Unspecified osteoarthritis, unspecified site: Secondary | ICD-10-CM

## 2024-04-14 DIAGNOSIS — F419 Anxiety disorder, unspecified: Secondary | ICD-10-CM

## 2024-04-14 DIAGNOSIS — R011 Cardiac murmur, unspecified: Secondary | ICD-10-CM

## 2024-04-14 DIAGNOSIS — M8580 Other specified disorders of bone density and structure, unspecified site: Secondary | ICD-10-CM

## 2024-04-14 DIAGNOSIS — Z91199 Personal history of noncompliance with medical treatment, presenting hazards to health: Secondary | ICD-10-CM

## 2024-04-14 DIAGNOSIS — Z9189 Other specified personal risk factors, not elsewhere classified: Secondary | ICD-10-CM

## 2024-04-14 DIAGNOSIS — N3281 Overactive bladder: Secondary | ICD-10-CM

## 2024-04-14 DIAGNOSIS — I10 Essential (primary) hypertension: Principal | ICD-10-CM

## 2024-04-14 DIAGNOSIS — G43909 Migraine, unspecified, not intractable, without status migrainosus: Secondary | ICD-10-CM

## 2024-04-14 DIAGNOSIS — S0510XA Contusion of eyeball and orbital tissues, unspecified eye, initial encounter: Secondary | ICD-10-CM

## 2024-04-14 DIAGNOSIS — R251 Tremor, unspecified: Secondary | ICD-10-CM

## 2024-04-14 DIAGNOSIS — J449 Chronic obstructive pulmonary disease, unspecified: Secondary | ICD-10-CM

## 2024-04-14 DIAGNOSIS — F32A Depression: Secondary | ICD-10-CM

## 2024-04-14 DIAGNOSIS — IMO0002 Lupus: Secondary | ICD-10-CM

## 2024-04-14 DIAGNOSIS — M3500 Sicca syndrome, unspecified: Secondary | ICD-10-CM

## 2024-04-14 DIAGNOSIS — I2510 Atherosclerotic heart disease of native coronary artery without angina pectoris: Secondary | ICD-10-CM

## 2024-04-14 DIAGNOSIS — E785 Hyperlipidemia, unspecified: Secondary | ICD-10-CM

## 2024-04-14 DIAGNOSIS — I2699 Other pulmonary embolism without acute cor pulmonale: Secondary | ICD-10-CM

## 2024-04-14 DIAGNOSIS — I6350 Cerebral infarction due to unspecified occlusion or stenosis of unspecified cerebral artery: Secondary | ICD-10-CM

## 2024-04-14 DIAGNOSIS — Z6832 Body mass index (BMI) 32.0-32.9, adult: Principal | ICD-10-CM

## 2024-04-14 DIAGNOSIS — I8290 Acute embolism and thrombosis of unspecified vein: Secondary | ICD-10-CM

## 2024-04-14 DIAGNOSIS — Z9109 Other allergy status, other than to drugs and biological substances: Secondary | ICD-10-CM

## 2024-04-14 DIAGNOSIS — K219 Gastro-esophageal reflux disease without esophagitis: Secondary | ICD-10-CM

## 2024-04-14 DIAGNOSIS — G459 Transient cerebral ischemic attack, unspecified: Secondary | ICD-10-CM

## 2024-04-14 DIAGNOSIS — D649 Anemia, unspecified: Secondary | ICD-10-CM

## 2024-04-15 ENCOUNTER — Encounter: Payer: MEDICARE | Attending: Mental Health | Primary: Internal Medicine

## 2024-04-15 DIAGNOSIS — Z91199 Personal history of noncompliance with medical treatment, presenting hazards to health: Secondary | ICD-10-CM

## 2024-04-15 DIAGNOSIS — E785 Hyperlipidemia, unspecified: Secondary | ICD-10-CM

## 2024-04-15 DIAGNOSIS — I8290 Acute embolism and thrombosis of unspecified vein: Secondary | ICD-10-CM

## 2024-04-15 DIAGNOSIS — Z9189 Other specified personal risk factors, not elsewhere classified: Secondary | ICD-10-CM

## 2024-04-15 DIAGNOSIS — N3281 Overactive bladder: Secondary | ICD-10-CM

## 2024-04-15 DIAGNOSIS — IMO0002 Lupus: Secondary | ICD-10-CM

## 2024-04-15 DIAGNOSIS — I2699 Other pulmonary embolism without acute cor pulmonale: Secondary | ICD-10-CM

## 2024-04-15 DIAGNOSIS — I2510 Atherosclerotic heart disease of native coronary artery without angina pectoris: Secondary | ICD-10-CM

## 2024-04-15 DIAGNOSIS — G43909 Migraine, unspecified, not intractable, without status migrainosus: Secondary | ICD-10-CM

## 2024-04-15 DIAGNOSIS — F32A Depression: Secondary | ICD-10-CM

## 2024-04-15 DIAGNOSIS — I10 Essential (primary) hypertension: Principal | ICD-10-CM

## 2024-04-15 DIAGNOSIS — I6350 Cerebral infarction due to unspecified occlusion or stenosis of unspecified cerebral artery: Secondary | ICD-10-CM

## 2024-04-15 DIAGNOSIS — S0510XA Contusion of eyeball and orbital tissues, unspecified eye, initial encounter: Secondary | ICD-10-CM

## 2024-04-15 DIAGNOSIS — M8580 Other specified disorders of bone density and structure, unspecified site: Secondary | ICD-10-CM

## 2024-04-15 DIAGNOSIS — F419 Anxiety disorder, unspecified: Secondary | ICD-10-CM

## 2024-04-15 DIAGNOSIS — D649 Anemia, unspecified: Secondary | ICD-10-CM

## 2024-04-15 DIAGNOSIS — J449 Chronic obstructive pulmonary disease, unspecified: Secondary | ICD-10-CM

## 2024-04-15 DIAGNOSIS — R011 Cardiac murmur, unspecified: Secondary | ICD-10-CM

## 2024-04-15 DIAGNOSIS — K219 Gastro-esophageal reflux disease without esophagitis: Secondary | ICD-10-CM

## 2024-04-15 DIAGNOSIS — G459 Transient cerebral ischemic attack, unspecified: Secondary | ICD-10-CM

## 2024-04-15 DIAGNOSIS — M1990 Unspecified osteoarthritis, unspecified site: Secondary | ICD-10-CM

## 2024-04-15 DIAGNOSIS — M3500 Sicca syndrome, unspecified: Secondary | ICD-10-CM

## 2024-04-15 DIAGNOSIS — R251 Tremor, unspecified: Secondary | ICD-10-CM

## 2024-04-15 DIAGNOSIS — Z9109 Other allergy status, other than to drugs and biological substances: Secondary | ICD-10-CM

## 2024-04-16 DIAGNOSIS — N3281 Overactive bladder: Secondary | ICD-10-CM

## 2024-04-16 DIAGNOSIS — J449 Chronic obstructive pulmonary disease, unspecified: Secondary | ICD-10-CM

## 2024-04-16 DIAGNOSIS — G43909 Migraine, unspecified, not intractable, without status migrainosus: Secondary | ICD-10-CM

## 2024-04-16 DIAGNOSIS — E785 Hyperlipidemia, unspecified: Secondary | ICD-10-CM

## 2024-04-16 DIAGNOSIS — F32A Depression: Secondary | ICD-10-CM

## 2024-04-16 DIAGNOSIS — M8580 Other specified disorders of bone density and structure, unspecified site: Secondary | ICD-10-CM

## 2024-04-16 DIAGNOSIS — K219 Gastro-esophageal reflux disease without esophagitis: Secondary | ICD-10-CM

## 2024-04-16 DIAGNOSIS — M1990 Unspecified osteoarthritis, unspecified site: Secondary | ICD-10-CM

## 2024-04-16 DIAGNOSIS — F419 Anxiety disorder, unspecified: Secondary | ICD-10-CM

## 2024-04-16 DIAGNOSIS — M3500 Sicca syndrome, unspecified: Secondary | ICD-10-CM

## 2024-04-16 DIAGNOSIS — I6350 Cerebral infarction due to unspecified occlusion or stenosis of unspecified cerebral artery: Secondary | ICD-10-CM

## 2024-04-16 DIAGNOSIS — Z9189 Other specified personal risk factors, not elsewhere classified: Secondary | ICD-10-CM

## 2024-04-16 DIAGNOSIS — D649 Anemia, unspecified: Secondary | ICD-10-CM

## 2024-04-16 DIAGNOSIS — I2510 Atherosclerotic heart disease of native coronary artery without angina pectoris: Secondary | ICD-10-CM

## 2024-04-16 DIAGNOSIS — R251 Tremor, unspecified: Secondary | ICD-10-CM

## 2024-04-16 DIAGNOSIS — I8290 Acute embolism and thrombosis of unspecified vein: Secondary | ICD-10-CM

## 2024-04-16 DIAGNOSIS — IMO0002 Lupus: Secondary | ICD-10-CM

## 2024-04-16 DIAGNOSIS — Z91199 Personal history of noncompliance with medical treatment, presenting hazards to health: Secondary | ICD-10-CM

## 2024-04-16 DIAGNOSIS — I2699 Other pulmonary embolism without acute cor pulmonale: Secondary | ICD-10-CM

## 2024-04-16 DIAGNOSIS — R011 Cardiac murmur, unspecified: Secondary | ICD-10-CM

## 2024-04-16 DIAGNOSIS — Z9109 Other allergy status, other than to drugs and biological substances: Secondary | ICD-10-CM

## 2024-04-16 DIAGNOSIS — S0510XA Contusion of eyeball and orbital tissues, unspecified eye, initial encounter: Secondary | ICD-10-CM

## 2024-04-16 DIAGNOSIS — I10 Essential (primary) hypertension: Principal | ICD-10-CM

## 2024-04-16 DIAGNOSIS — G459 Transient cerebral ischemic attack, unspecified: Secondary | ICD-10-CM

## 2024-04-20 MED ORDER — DULOXETINE HCL 30 MG PO CPEP
30 mg | Freq: Two times a day (BID) | ORAL
Start: 2024-04-20 — End: ?

## 2024-04-20 MED ORDER — HYDROCHLOROTHIAZIDE 25 MG PO TABS
25 mg | Freq: Every day | ORAL
Start: 2024-04-20 — End: ?

## 2024-04-20 MED ORDER — ALENDRONATE SODIUM 70 MG PO TABS
70 mg | ORAL
Start: 2024-04-20 — End: ?

## 2024-04-20 MED ORDER — ERGOCALCIFEROL 1.25 MG (50000 UT) PO CAPS
50000 [IU] | ORAL
Start: 2024-04-20 — End: ?

## 2024-04-27 ENCOUNTER — Encounter: Payer: MEDICARE | Attending: Mental Health | Primary: Internal Medicine

## 2024-04-28 ENCOUNTER — Telehealth: Payer: MEDICARE | Attending: Mental Health | Primary: Internal Medicine

## 2024-04-28 DIAGNOSIS — G459 Transient cerebral ischemic attack, unspecified: Secondary | ICD-10-CM

## 2024-04-28 DIAGNOSIS — N3281 Overactive bladder: Secondary | ICD-10-CM

## 2024-04-28 DIAGNOSIS — M1990 Unspecified osteoarthritis, unspecified site: Secondary | ICD-10-CM

## 2024-04-28 DIAGNOSIS — J449 Chronic obstructive pulmonary disease, unspecified: Secondary | ICD-10-CM

## 2024-04-28 DIAGNOSIS — D649 Anemia, unspecified: Secondary | ICD-10-CM

## 2024-04-28 DIAGNOSIS — I8290 Acute embolism and thrombosis of unspecified vein: Secondary | ICD-10-CM

## 2024-04-28 DIAGNOSIS — F419 Anxiety disorder, unspecified: Secondary | ICD-10-CM

## 2024-04-28 DIAGNOSIS — F4310 Post-traumatic stress disorder, unspecified: Principal | ICD-10-CM

## 2024-04-28 DIAGNOSIS — Z9189 Other specified personal risk factors, not elsewhere classified: Secondary | ICD-10-CM

## 2024-04-28 DIAGNOSIS — G43909 Migraine, unspecified, not intractable, without status migrainosus: Secondary | ICD-10-CM

## 2024-04-28 DIAGNOSIS — Z91199 Personal history of noncompliance with medical treatment, presenting hazards to health: Secondary | ICD-10-CM

## 2024-04-28 DIAGNOSIS — Z9109 Other allergy status, other than to drugs and biological substances: Secondary | ICD-10-CM

## 2024-04-28 DIAGNOSIS — R011 Cardiac murmur, unspecified: Secondary | ICD-10-CM

## 2024-04-28 DIAGNOSIS — I6350 Cerebral infarction due to unspecified occlusion or stenosis of unspecified cerebral artery: Secondary | ICD-10-CM

## 2024-04-28 DIAGNOSIS — M8580 Other specified disorders of bone density and structure, unspecified site: Secondary | ICD-10-CM

## 2024-04-28 DIAGNOSIS — I2510 Atherosclerotic heart disease of native coronary artery without angina pectoris: Secondary | ICD-10-CM

## 2024-04-28 DIAGNOSIS — M3500 Sicca syndrome, unspecified: Secondary | ICD-10-CM

## 2024-04-28 DIAGNOSIS — I10 Essential (primary) hypertension: Principal | ICD-10-CM

## 2024-04-28 DIAGNOSIS — S0510XA Contusion of eyeball and orbital tissues, unspecified eye, initial encounter: Secondary | ICD-10-CM

## 2024-04-28 DIAGNOSIS — IMO0002 Lupus: Secondary | ICD-10-CM

## 2024-04-28 DIAGNOSIS — I2699 Other pulmonary embolism without acute cor pulmonale: Secondary | ICD-10-CM

## 2024-04-28 DIAGNOSIS — K219 Gastro-esophageal reflux disease without esophagitis: Secondary | ICD-10-CM

## 2024-04-28 DIAGNOSIS — F32A Depression: Secondary | ICD-10-CM

## 2024-04-28 DIAGNOSIS — E785 Hyperlipidemia, unspecified: Secondary | ICD-10-CM

## 2024-04-28 DIAGNOSIS — R251 Tremor, unspecified: Secondary | ICD-10-CM

## 2024-04-29 MED ORDER — DILT-XR 180 MG PO CP24
180 mg | Freq: Every day | ORAL | 2 refills | 30.00000 days | Status: CP
Start: 2024-04-29 — End: ?

## 2024-05-03 ENCOUNTER — Telehealth: Payer: MEDICARE | Primary: Internal Medicine

## 2024-05-03 DIAGNOSIS — IMO0002 Lupus: Secondary | ICD-10-CM

## 2024-05-03 DIAGNOSIS — Z91199 Personal history of noncompliance with medical treatment, presenting hazards to health: Secondary | ICD-10-CM

## 2024-05-03 DIAGNOSIS — F419 Anxiety disorder, unspecified: Secondary | ICD-10-CM

## 2024-05-03 DIAGNOSIS — D649 Anemia, unspecified: Secondary | ICD-10-CM

## 2024-05-03 DIAGNOSIS — M3500 Sicca syndrome, unspecified: Secondary | ICD-10-CM

## 2024-05-03 DIAGNOSIS — I6350 Cerebral infarction due to unspecified occlusion or stenosis of unspecified cerebral artery: Secondary | ICD-10-CM

## 2024-05-03 DIAGNOSIS — F32A Depression: Secondary | ICD-10-CM

## 2024-05-03 DIAGNOSIS — I2699 Other pulmonary embolism without acute cor pulmonale: Secondary | ICD-10-CM

## 2024-05-03 DIAGNOSIS — G43909 Migraine, unspecified, not intractable, without status migrainosus: Secondary | ICD-10-CM

## 2024-05-03 DIAGNOSIS — N3281 Overactive bladder: Secondary | ICD-10-CM

## 2024-05-03 DIAGNOSIS — S0510XA Contusion of eyeball and orbital tissues, unspecified eye, initial encounter: Secondary | ICD-10-CM

## 2024-05-03 DIAGNOSIS — K219 Gastro-esophageal reflux disease without esophagitis: Secondary | ICD-10-CM

## 2024-05-03 DIAGNOSIS — R011 Cardiac murmur, unspecified: Secondary | ICD-10-CM

## 2024-05-03 DIAGNOSIS — I2510 Atherosclerotic heart disease of native coronary artery without angina pectoris: Secondary | ICD-10-CM

## 2024-05-03 DIAGNOSIS — M1990 Unspecified osteoarthritis, unspecified site: Secondary | ICD-10-CM

## 2024-05-03 DIAGNOSIS — E785 Hyperlipidemia, unspecified: Secondary | ICD-10-CM

## 2024-05-03 DIAGNOSIS — M8580 Other specified disorders of bone density and structure, unspecified site: Secondary | ICD-10-CM

## 2024-05-03 DIAGNOSIS — J449 Chronic obstructive pulmonary disease, unspecified: Secondary | ICD-10-CM

## 2024-05-03 DIAGNOSIS — I10 Essential (primary) hypertension: Principal | ICD-10-CM

## 2024-05-03 DIAGNOSIS — I8290 Acute embolism and thrombosis of unspecified vein: Secondary | ICD-10-CM

## 2024-05-03 DIAGNOSIS — G459 Transient cerebral ischemic attack, unspecified: Secondary | ICD-10-CM

## 2024-05-03 DIAGNOSIS — Z9189 Other specified personal risk factors, not elsewhere classified: Secondary | ICD-10-CM

## 2024-05-03 DIAGNOSIS — R251 Tremor, unspecified: Secondary | ICD-10-CM

## 2024-05-03 DIAGNOSIS — Z9109 Other allergy status, other than to drugs and biological substances: Secondary | ICD-10-CM

## 2024-05-03 MED ORDER — CLONAZEPAM 0.5 MG PO TABS
.5 mg | Freq: Every evening | ORAL | 0 refills | 30.00 days | Status: CP | PRN
Start: 2024-05-03 — End: ?

## 2024-05-05 ENCOUNTER — Encounter: Payer: MEDICARE | Attending: Mental Health | Primary: Internal Medicine

## 2024-05-12 ENCOUNTER — Telehealth: Payer: MEDICARE | Attending: Mental Health | Primary: Internal Medicine

## 2024-05-12 DIAGNOSIS — I2699 Other pulmonary embolism without acute cor pulmonale: Secondary | ICD-10-CM

## 2024-05-12 DIAGNOSIS — F32A Depression: Secondary | ICD-10-CM

## 2024-05-12 DIAGNOSIS — F4310 Post-traumatic stress disorder, unspecified: Secondary | ICD-10-CM

## 2024-05-12 DIAGNOSIS — G43909 Migraine, unspecified, not intractable, without status migrainosus: Secondary | ICD-10-CM

## 2024-05-12 DIAGNOSIS — R251 Tremor, unspecified: Secondary | ICD-10-CM

## 2024-05-12 DIAGNOSIS — I6350 Cerebral infarction due to unspecified occlusion or stenosis of unspecified cerebral artery: Secondary | ICD-10-CM

## 2024-05-12 DIAGNOSIS — M8580 Other specified disorders of bone density and structure, unspecified site: Secondary | ICD-10-CM

## 2024-05-12 DIAGNOSIS — Z9189 Other specified personal risk factors, not elsewhere classified: Secondary | ICD-10-CM

## 2024-05-12 DIAGNOSIS — IMO0002 Lupus: Secondary | ICD-10-CM

## 2024-05-12 DIAGNOSIS — K219 Gastro-esophageal reflux disease without esophagitis: Secondary | ICD-10-CM

## 2024-05-12 DIAGNOSIS — D649 Anemia, unspecified: Secondary | ICD-10-CM

## 2024-05-12 DIAGNOSIS — G459 Transient cerebral ischemic attack, unspecified: Secondary | ICD-10-CM

## 2024-05-12 DIAGNOSIS — R011 Cardiac murmur, unspecified: Secondary | ICD-10-CM

## 2024-05-12 DIAGNOSIS — Z9109 Other allergy status, other than to drugs and biological substances: Secondary | ICD-10-CM

## 2024-05-12 DIAGNOSIS — Z91199 Personal history of noncompliance with medical treatment, presenting hazards to health: Secondary | ICD-10-CM

## 2024-05-12 DIAGNOSIS — I10 Essential (primary) hypertension: Principal | ICD-10-CM

## 2024-05-12 DIAGNOSIS — I2510 Atherosclerotic heart disease of native coronary artery without angina pectoris: Secondary | ICD-10-CM

## 2024-05-12 DIAGNOSIS — N3281 Overactive bladder: Secondary | ICD-10-CM

## 2024-05-12 DIAGNOSIS — M3500 Sicca syndrome, unspecified: Secondary | ICD-10-CM

## 2024-05-12 DIAGNOSIS — S0510XA Contusion of eyeball and orbital tissues, unspecified eye, initial encounter: Secondary | ICD-10-CM

## 2024-05-12 DIAGNOSIS — F419 Anxiety disorder, unspecified: Secondary | ICD-10-CM

## 2024-05-12 DIAGNOSIS — M1990 Unspecified osteoarthritis, unspecified site: Secondary | ICD-10-CM

## 2024-05-12 DIAGNOSIS — E785 Hyperlipidemia, unspecified: Secondary | ICD-10-CM

## 2024-05-12 DIAGNOSIS — J449 Chronic obstructive pulmonary disease, unspecified: Secondary | ICD-10-CM

## 2024-05-12 DIAGNOSIS — I8290 Acute embolism and thrombosis of unspecified vein: Secondary | ICD-10-CM

## 2024-05-17 ENCOUNTER — Telehealth: Payer: MEDICARE | Primary: Internal Medicine

## 2024-05-17 MED ORDER — CLONAZEPAM 0.5 MG PO TABS
.25 mg | Freq: Every evening | ORAL | PRN
Start: 2024-05-17 — End: ?

## 2024-05-19 DIAGNOSIS — F32A Depression: Secondary | ICD-10-CM

## 2024-05-19 DIAGNOSIS — I2699 Other pulmonary embolism without acute cor pulmonale: Secondary | ICD-10-CM

## 2024-05-19 DIAGNOSIS — J449 Chronic obstructive pulmonary disease, unspecified: Secondary | ICD-10-CM

## 2024-05-19 DIAGNOSIS — I6350 Cerebral infarction due to unspecified occlusion or stenosis of unspecified cerebral artery: Secondary | ICD-10-CM

## 2024-05-19 DIAGNOSIS — F419 Anxiety disorder, unspecified: Secondary | ICD-10-CM

## 2024-05-19 DIAGNOSIS — M8580 Other specified disorders of bone density and structure, unspecified site: Secondary | ICD-10-CM

## 2024-05-19 DIAGNOSIS — N3281 Overactive bladder: Secondary | ICD-10-CM

## 2024-05-19 DIAGNOSIS — Z91199 Personal history of noncompliance with medical treatment, presenting hazards to health: Secondary | ICD-10-CM

## 2024-05-19 DIAGNOSIS — I10 Essential (primary) hypertension: Principal | ICD-10-CM

## 2024-05-19 DIAGNOSIS — R011 Cardiac murmur, unspecified: Secondary | ICD-10-CM

## 2024-05-19 DIAGNOSIS — S0510XA Contusion of eyeball and orbital tissues, unspecified eye, initial encounter: Secondary | ICD-10-CM

## 2024-05-19 DIAGNOSIS — E785 Hyperlipidemia, unspecified: Secondary | ICD-10-CM

## 2024-05-19 DIAGNOSIS — G43909 Migraine, unspecified, not intractable, without status migrainosus: Secondary | ICD-10-CM

## 2024-05-19 DIAGNOSIS — Z9189 Other specified personal risk factors, not elsewhere classified: Secondary | ICD-10-CM

## 2024-05-19 DIAGNOSIS — D649 Anemia, unspecified: Secondary | ICD-10-CM

## 2024-05-19 DIAGNOSIS — R251 Tremor, unspecified: Secondary | ICD-10-CM

## 2024-05-19 DIAGNOSIS — G459 Transient cerebral ischemic attack, unspecified: Secondary | ICD-10-CM

## 2024-05-19 DIAGNOSIS — M3500 Sicca syndrome, unspecified: Secondary | ICD-10-CM

## 2024-05-19 DIAGNOSIS — IMO0002 Lupus: Secondary | ICD-10-CM

## 2024-05-19 DIAGNOSIS — I2510 Atherosclerotic heart disease of native coronary artery without angina pectoris: Secondary | ICD-10-CM

## 2024-05-19 DIAGNOSIS — Z9109 Other allergy status, other than to drugs and biological substances: Secondary | ICD-10-CM

## 2024-05-19 DIAGNOSIS — M1990 Unspecified osteoarthritis, unspecified site: Secondary | ICD-10-CM

## 2024-05-19 DIAGNOSIS — I8290 Acute embolism and thrombosis of unspecified vein: Secondary | ICD-10-CM

## 2024-05-19 DIAGNOSIS — K219 Gastro-esophageal reflux disease without esophagitis: Secondary | ICD-10-CM

## 2024-05-31 ENCOUNTER — Telehealth: Payer: MEDICARE | Primary: Internal Medicine

## 2024-05-31 DIAGNOSIS — Z9189 Other specified personal risk factors, not elsewhere classified: Secondary | ICD-10-CM

## 2024-05-31 DIAGNOSIS — F4310 Post-traumatic stress disorder, unspecified: Secondary | ICD-10-CM

## 2024-05-31 DIAGNOSIS — R251 Tremor, unspecified: Secondary | ICD-10-CM

## 2024-05-31 DIAGNOSIS — F419 Anxiety disorder, unspecified: Secondary | ICD-10-CM

## 2024-05-31 DIAGNOSIS — IMO0002 Lupus: Secondary | ICD-10-CM

## 2024-05-31 DIAGNOSIS — R011 Cardiac murmur, unspecified: Secondary | ICD-10-CM

## 2024-05-31 DIAGNOSIS — I10 Essential (primary) hypertension: Principal | ICD-10-CM

## 2024-05-31 DIAGNOSIS — M3500 Sicca syndrome, unspecified: Secondary | ICD-10-CM

## 2024-05-31 DIAGNOSIS — I2699 Other pulmonary embolism without acute cor pulmonale: Secondary | ICD-10-CM

## 2024-05-31 DIAGNOSIS — F331 Major depressive disorder, recurrent, moderate: Secondary | ICD-10-CM

## 2024-05-31 DIAGNOSIS — I2510 Atherosclerotic heart disease of native coronary artery without angina pectoris: Secondary | ICD-10-CM

## 2024-05-31 DIAGNOSIS — K219 Gastro-esophageal reflux disease without esophagitis: Secondary | ICD-10-CM

## 2024-05-31 DIAGNOSIS — Z9109 Other allergy status, other than to drugs and biological substances: Secondary | ICD-10-CM

## 2024-05-31 DIAGNOSIS — N3281 Overactive bladder: Secondary | ICD-10-CM

## 2024-05-31 DIAGNOSIS — M8580 Other specified disorders of bone density and structure, unspecified site: Secondary | ICD-10-CM

## 2024-05-31 DIAGNOSIS — J449 Chronic obstructive pulmonary disease, unspecified: Secondary | ICD-10-CM

## 2024-05-31 DIAGNOSIS — I8290 Acute embolism and thrombosis of unspecified vein: Secondary | ICD-10-CM

## 2024-05-31 DIAGNOSIS — I6350 Cerebral infarction due to unspecified occlusion or stenosis of unspecified cerebral artery: Secondary | ICD-10-CM

## 2024-05-31 DIAGNOSIS — M1990 Unspecified osteoarthritis, unspecified site: Secondary | ICD-10-CM

## 2024-05-31 DIAGNOSIS — G459 Transient cerebral ischemic attack, unspecified: Secondary | ICD-10-CM

## 2024-05-31 DIAGNOSIS — F32A Depression: Secondary | ICD-10-CM

## 2024-05-31 DIAGNOSIS — G43909 Migraine, unspecified, not intractable, without status migrainosus: Secondary | ICD-10-CM

## 2024-05-31 DIAGNOSIS — Z91199 Personal history of noncompliance with medical treatment, presenting hazards to health: Secondary | ICD-10-CM

## 2024-05-31 DIAGNOSIS — E785 Hyperlipidemia, unspecified: Secondary | ICD-10-CM

## 2024-05-31 DIAGNOSIS — S0510XA Contusion of eyeball and orbital tissues, unspecified eye, initial encounter: Secondary | ICD-10-CM

## 2024-05-31 DIAGNOSIS — D649 Anemia, unspecified: Secondary | ICD-10-CM

## 2024-05-31 MED ORDER — CLONAZEPAM 0.5 MG PO TABS
.25 mg | Freq: Every evening | ORAL | 0 refills | 30.00000 days | Status: CP | PRN
Start: 2024-05-31 — End: ?

## 2024-05-31 MED ORDER — SERTRALINE HCL 100 MG PO TABS
200 mg | Freq: Every day | ORAL | 0 refills | 30.00000 days | Status: CP
Start: 2024-05-31 — End: ?

## 2024-06-02 DIAGNOSIS — H6993 Unspecified Eustachian tube disorder, bilateral: Principal | ICD-10-CM

## 2024-06-03 MED ORDER — FLUTICASONE PROPIONATE 50 MCG/ACT NA SUSP
1 | Freq: Every day | NASAL | 1 refills | 30.00000 days | Status: CP
Start: 2024-06-03 — End: ?

## 2024-06-07 DIAGNOSIS — Z6836 Body mass index (BMI) 36.0-36.9, adult: Principal | ICD-10-CM

## 2024-06-07 MED ORDER — MOUNJARO 15 MG/0.5ML SC SOAJ
2 refills
Start: 2024-06-07 — End: ?

## 2024-06-08 ENCOUNTER — Encounter: Payer: MEDICARE | Attending: Mental Health | Primary: Internal Medicine

## 2024-06-08 ENCOUNTER — Ambulatory Visit: Payer: MEDICARE | Primary: Internal Medicine

## 2024-06-08 DIAGNOSIS — E785 Hyperlipidemia, unspecified: Secondary | ICD-10-CM

## 2024-06-08 DIAGNOSIS — K219 Gastro-esophageal reflux disease without esophagitis: Secondary | ICD-10-CM

## 2024-06-08 DIAGNOSIS — Z91199 Personal history of noncompliance with medical treatment, presenting hazards to health: Secondary | ICD-10-CM

## 2024-06-08 DIAGNOSIS — G894 Chronic pain syndrome: Secondary | ICD-10-CM

## 2024-06-08 DIAGNOSIS — K5909 Other constipation: Secondary | ICD-10-CM

## 2024-06-08 DIAGNOSIS — Z6832 Body mass index (BMI) 32.0-32.9, adult: Principal | ICD-10-CM

## 2024-06-08 DIAGNOSIS — F419 Anxiety disorder, unspecified: Secondary | ICD-10-CM

## 2024-06-08 DIAGNOSIS — I10 Essential (primary) hypertension: Principal | ICD-10-CM

## 2024-06-08 DIAGNOSIS — G43909 Migraine, unspecified, not intractable, without status migrainosus: Secondary | ICD-10-CM

## 2024-06-08 DIAGNOSIS — N3281 Overactive bladder: Secondary | ICD-10-CM

## 2024-06-08 DIAGNOSIS — Z9189 Other specified personal risk factors, not elsewhere classified: Secondary | ICD-10-CM

## 2024-06-08 DIAGNOSIS — I2699 Other pulmonary embolism without acute cor pulmonale: Secondary | ICD-10-CM

## 2024-06-08 DIAGNOSIS — M1990 Unspecified osteoarthritis, unspecified site: Secondary | ICD-10-CM

## 2024-06-08 DIAGNOSIS — G459 Transient cerebral ischemic attack, unspecified: Secondary | ICD-10-CM

## 2024-06-08 DIAGNOSIS — M8580 Other specified disorders of bone density and structure, unspecified site: Secondary | ICD-10-CM

## 2024-06-08 DIAGNOSIS — S0510XA Contusion of eyeball and orbital tissues, unspecified eye, initial encounter: Secondary | ICD-10-CM

## 2024-06-08 DIAGNOSIS — R011 Cardiac murmur, unspecified: Secondary | ICD-10-CM

## 2024-06-08 DIAGNOSIS — I6350 Cerebral infarction due to unspecified occlusion or stenosis of unspecified cerebral artery: Secondary | ICD-10-CM

## 2024-06-08 DIAGNOSIS — R251 Tremor, unspecified: Secondary | ICD-10-CM

## 2024-06-08 DIAGNOSIS — I8290 Acute embolism and thrombosis of unspecified vein: Secondary | ICD-10-CM

## 2024-06-08 DIAGNOSIS — F32A Depression: Secondary | ICD-10-CM

## 2024-06-08 DIAGNOSIS — M3500 Sicca syndrome, unspecified: Secondary | ICD-10-CM

## 2024-06-08 DIAGNOSIS — I2510 Atherosclerotic heart disease of native coronary artery without angina pectoris: Secondary | ICD-10-CM

## 2024-06-08 DIAGNOSIS — Z9109 Other allergy status, other than to drugs and biological substances: Secondary | ICD-10-CM

## 2024-06-08 DIAGNOSIS — D649 Anemia, unspecified: Secondary | ICD-10-CM

## 2024-06-08 DIAGNOSIS — IMO0002 Lupus: Secondary | ICD-10-CM

## 2024-06-08 DIAGNOSIS — J449 Chronic obstructive pulmonary disease, unspecified: Secondary | ICD-10-CM

## 2024-06-08 DIAGNOSIS — Z713 Dietary counseling and surveillance: Secondary | ICD-10-CM

## 2024-06-08 DIAGNOSIS — Z7689 Persons encountering health services in other specified circumstances: Secondary | ICD-10-CM

## 2024-06-08 MED ORDER — MOUNJARO 15 MG/0.5ML SC SOAJ
15 mg | SUBCUTANEOUS | 2 refills | 28.00000 days | Status: CP
Start: 2024-06-08 — End: ?

## 2024-06-09 DIAGNOSIS — S0510XA Contusion of eyeball and orbital tissues, unspecified eye, initial encounter: Secondary | ICD-10-CM

## 2024-06-09 DIAGNOSIS — F32A Depression: Secondary | ICD-10-CM

## 2024-06-09 DIAGNOSIS — M3500 Sicca syndrome, unspecified: Secondary | ICD-10-CM

## 2024-06-09 DIAGNOSIS — G43909 Migraine, unspecified, not intractable, without status migrainosus: Secondary | ICD-10-CM

## 2024-06-09 DIAGNOSIS — IMO0002 Lupus: Secondary | ICD-10-CM

## 2024-06-09 DIAGNOSIS — I8290 Acute embolism and thrombosis of unspecified vein: Secondary | ICD-10-CM

## 2024-06-09 DIAGNOSIS — I2699 Other pulmonary embolism without acute cor pulmonale: Secondary | ICD-10-CM

## 2024-06-09 DIAGNOSIS — R011 Cardiac murmur, unspecified: Secondary | ICD-10-CM

## 2024-06-09 DIAGNOSIS — M8580 Other specified disorders of bone density and structure, unspecified site: Secondary | ICD-10-CM

## 2024-06-09 DIAGNOSIS — J449 Chronic obstructive pulmonary disease, unspecified: Secondary | ICD-10-CM

## 2024-06-09 DIAGNOSIS — E785 Hyperlipidemia, unspecified: Secondary | ICD-10-CM

## 2024-06-09 DIAGNOSIS — I10 Essential (primary) hypertension: Principal | ICD-10-CM

## 2024-06-09 DIAGNOSIS — R251 Tremor, unspecified: Secondary | ICD-10-CM

## 2024-06-09 DIAGNOSIS — M1990 Unspecified osteoarthritis, unspecified site: Secondary | ICD-10-CM

## 2024-06-09 DIAGNOSIS — K219 Gastro-esophageal reflux disease without esophagitis: Secondary | ICD-10-CM

## 2024-06-09 DIAGNOSIS — N3281 Overactive bladder: Secondary | ICD-10-CM

## 2024-06-09 DIAGNOSIS — F419 Anxiety disorder, unspecified: Secondary | ICD-10-CM

## 2024-06-09 DIAGNOSIS — D649 Anemia, unspecified: Secondary | ICD-10-CM

## 2024-06-09 DIAGNOSIS — Z91199 Personal history of noncompliance with medical treatment, presenting hazards to health: Secondary | ICD-10-CM

## 2024-06-09 DIAGNOSIS — Z9189 Other specified personal risk factors, not elsewhere classified: Secondary | ICD-10-CM

## 2024-06-09 DIAGNOSIS — I2510 Atherosclerotic heart disease of native coronary artery without angina pectoris: Secondary | ICD-10-CM

## 2024-06-09 DIAGNOSIS — G459 Transient cerebral ischemic attack, unspecified: Secondary | ICD-10-CM

## 2024-06-09 DIAGNOSIS — Z9109 Other allergy status, other than to drugs and biological substances: Secondary | ICD-10-CM

## 2024-06-09 DIAGNOSIS — I6350 Cerebral infarction due to unspecified occlusion or stenosis of unspecified cerebral artery: Secondary | ICD-10-CM

## 2024-06-11 MED ORDER — HYDROXYZINE HCL 25 MG PO TABS
25 mg | Freq: Three times a day (TID) | ORAL | 1 refills | 30.00000 days | Status: CP | PRN
Start: 2024-06-11 — End: ?

## 2024-06-28 ENCOUNTER — Telehealth: Payer: MEDICARE | Primary: Internal Medicine

## 2024-06-28 DIAGNOSIS — R251 Tremor, unspecified: Secondary | ICD-10-CM

## 2024-06-28 DIAGNOSIS — M1990 Unspecified osteoarthritis, unspecified site: Secondary | ICD-10-CM

## 2024-06-28 DIAGNOSIS — E785 Hyperlipidemia, unspecified: Secondary | ICD-10-CM

## 2024-06-28 DIAGNOSIS — F331 Major depressive disorder, recurrent, moderate: Secondary | ICD-10-CM

## 2024-06-28 DIAGNOSIS — J449 Chronic obstructive pulmonary disease, unspecified: Secondary | ICD-10-CM

## 2024-06-28 DIAGNOSIS — N3281 Overactive bladder: Secondary | ICD-10-CM

## 2024-06-28 DIAGNOSIS — I6350 Cerebral infarction due to unspecified occlusion or stenosis of unspecified cerebral artery: Secondary | ICD-10-CM

## 2024-06-28 DIAGNOSIS — I2510 Atherosclerotic heart disease of native coronary artery without angina pectoris: Secondary | ICD-10-CM

## 2024-06-28 DIAGNOSIS — D649 Anemia, unspecified: Secondary | ICD-10-CM

## 2024-06-28 DIAGNOSIS — F4310 Post-traumatic stress disorder, unspecified: Secondary | ICD-10-CM

## 2024-06-28 DIAGNOSIS — G459 Transient cerebral ischemic attack, unspecified: Secondary | ICD-10-CM

## 2024-06-28 DIAGNOSIS — S0510XA Contusion of eyeball and orbital tissues, unspecified eye, initial encounter: Secondary | ICD-10-CM

## 2024-06-28 DIAGNOSIS — Z9109 Other allergy status, other than to drugs and biological substances: Secondary | ICD-10-CM

## 2024-06-28 DIAGNOSIS — M8580 Other specified disorders of bone density and structure, unspecified site: Secondary | ICD-10-CM

## 2024-06-28 DIAGNOSIS — Z91199 Personal history of noncompliance with medical treatment, presenting hazards to health: Secondary | ICD-10-CM

## 2024-06-28 DIAGNOSIS — K219 Gastro-esophageal reflux disease without esophagitis: Secondary | ICD-10-CM

## 2024-06-28 DIAGNOSIS — Z9189 Other specified personal risk factors, not elsewhere classified: Secondary | ICD-10-CM

## 2024-06-28 DIAGNOSIS — R011 Cardiac murmur, unspecified: Secondary | ICD-10-CM

## 2024-06-28 DIAGNOSIS — I8290 Acute embolism and thrombosis of unspecified vein: Secondary | ICD-10-CM

## 2024-06-28 DIAGNOSIS — F32A Depression: Secondary | ICD-10-CM

## 2024-06-28 DIAGNOSIS — I10 Essential (primary) hypertension: Principal | ICD-10-CM

## 2024-06-28 DIAGNOSIS — I2699 Other pulmonary embolism without acute cor pulmonale: Secondary | ICD-10-CM

## 2024-06-28 DIAGNOSIS — F419 Anxiety disorder, unspecified: Secondary | ICD-10-CM

## 2024-06-28 DIAGNOSIS — IMO0002 Lupus: Secondary | ICD-10-CM

## 2024-06-28 DIAGNOSIS — M3500 Sicca syndrome, unspecified: Secondary | ICD-10-CM

## 2024-06-28 DIAGNOSIS — G43909 Migraine, unspecified, not intractable, without status migrainosus: Secondary | ICD-10-CM

## 2024-06-28 MED ORDER — HYDROXYZINE HCL 25 MG PO TABS
12.5 mg | Freq: Every day | ORAL | PRN
Start: 2024-06-28 — End: ?

## 2024-06-28 MED ORDER — CLONAZEPAM 0.5 MG PO TABS
.25 mg | Freq: Every evening | ORAL | 0 refills | 30.00000 days | Status: CP | PRN
Start: 2024-06-28 — End: ?

## 2024-06-28 MED ORDER — HYDROXYZINE HCL 25 MG PO TABS
12.5 mg | Freq: Three times a day (TID) | ORAL | 0 refills | 30.00000 days | Status: CP | PRN
Start: 2024-06-28 — End: 2024-06-28

## 2024-06-30 ENCOUNTER — Telehealth: Payer: MEDICARE | Attending: Mental Health | Primary: Internal Medicine

## 2024-06-30 DIAGNOSIS — IMO0002 Lupus: Secondary | ICD-10-CM

## 2024-06-30 DIAGNOSIS — Z9109 Other allergy status, other than to drugs and biological substances: Secondary | ICD-10-CM

## 2024-06-30 DIAGNOSIS — D649 Anemia, unspecified: Secondary | ICD-10-CM

## 2024-06-30 DIAGNOSIS — I10 Essential (primary) hypertension: Principal | ICD-10-CM

## 2024-06-30 DIAGNOSIS — I8290 Acute embolism and thrombosis of unspecified vein: Secondary | ICD-10-CM

## 2024-06-30 DIAGNOSIS — I2510 Atherosclerotic heart disease of native coronary artery without angina pectoris: Secondary | ICD-10-CM

## 2024-06-30 DIAGNOSIS — K219 Gastro-esophageal reflux disease without esophagitis: Secondary | ICD-10-CM

## 2024-06-30 DIAGNOSIS — S0510XA Contusion of eyeball and orbital tissues, unspecified eye, initial encounter: Secondary | ICD-10-CM

## 2024-06-30 DIAGNOSIS — G43909 Migraine, unspecified, not intractable, without status migrainosus: Secondary | ICD-10-CM

## 2024-06-30 DIAGNOSIS — G459 Transient cerebral ischemic attack, unspecified: Secondary | ICD-10-CM

## 2024-06-30 DIAGNOSIS — R011 Cardiac murmur, unspecified: Secondary | ICD-10-CM

## 2024-06-30 DIAGNOSIS — Z91199 Personal history of noncompliance with medical treatment, presenting hazards to health: Secondary | ICD-10-CM

## 2024-06-30 DIAGNOSIS — F419 Anxiety disorder, unspecified: Secondary | ICD-10-CM

## 2024-06-30 DIAGNOSIS — M3500 Sicca syndrome, unspecified: Secondary | ICD-10-CM

## 2024-06-30 DIAGNOSIS — F32A Depression: Secondary | ICD-10-CM

## 2024-06-30 DIAGNOSIS — M8580 Other specified disorders of bone density and structure, unspecified site: Secondary | ICD-10-CM

## 2024-06-30 DIAGNOSIS — J449 Chronic obstructive pulmonary disease, unspecified: Secondary | ICD-10-CM

## 2024-06-30 DIAGNOSIS — R251 Tremor, unspecified: Secondary | ICD-10-CM

## 2024-06-30 DIAGNOSIS — I6350 Cerebral infarction due to unspecified occlusion or stenosis of unspecified cerebral artery: Secondary | ICD-10-CM

## 2024-06-30 DIAGNOSIS — F4310 Post-traumatic stress disorder, unspecified: Principal | ICD-10-CM

## 2024-06-30 DIAGNOSIS — E785 Hyperlipidemia, unspecified: Secondary | ICD-10-CM

## 2024-06-30 DIAGNOSIS — N3281 Overactive bladder: Secondary | ICD-10-CM

## 2024-06-30 DIAGNOSIS — Z9189 Other specified personal risk factors, not elsewhere classified: Secondary | ICD-10-CM

## 2024-06-30 DIAGNOSIS — I2699 Other pulmonary embolism without acute cor pulmonale: Secondary | ICD-10-CM

## 2024-06-30 DIAGNOSIS — M1990 Unspecified osteoarthritis, unspecified site: Secondary | ICD-10-CM

## 2024-07-07 ENCOUNTER — Telehealth: Payer: MEDICARE | Attending: Mental Health | Primary: Internal Medicine

## 2024-07-07 DIAGNOSIS — I2510 Atherosclerotic heart disease of native coronary artery without angina pectoris: Secondary | ICD-10-CM

## 2024-07-07 DIAGNOSIS — Z9109 Other allergy status, other than to drugs and biological substances: Secondary | ICD-10-CM

## 2024-07-07 DIAGNOSIS — I8290 Acute embolism and thrombosis of unspecified vein: Secondary | ICD-10-CM

## 2024-07-07 DIAGNOSIS — G43909 Migraine, unspecified, not intractable, without status migrainosus: Secondary | ICD-10-CM

## 2024-07-07 DIAGNOSIS — D649 Anemia, unspecified: Secondary | ICD-10-CM

## 2024-07-07 DIAGNOSIS — G459 Transient cerebral ischemic attack, unspecified: Secondary | ICD-10-CM

## 2024-07-07 DIAGNOSIS — M1990 Unspecified osteoarthritis, unspecified site: Secondary | ICD-10-CM

## 2024-07-07 DIAGNOSIS — F4310 Post-traumatic stress disorder, unspecified: Principal | ICD-10-CM

## 2024-07-07 DIAGNOSIS — I10 Essential (primary) hypertension: Principal | ICD-10-CM

## 2024-07-07 DIAGNOSIS — K219 Gastro-esophageal reflux disease without esophagitis: Secondary | ICD-10-CM

## 2024-07-07 DIAGNOSIS — N3281 Overactive bladder: Secondary | ICD-10-CM

## 2024-07-07 DIAGNOSIS — E785 Hyperlipidemia, unspecified: Secondary | ICD-10-CM

## 2024-07-07 DIAGNOSIS — S0510XA Contusion of eyeball and orbital tissues, unspecified eye, initial encounter: Secondary | ICD-10-CM

## 2024-07-07 DIAGNOSIS — M3500 Sicca syndrome, unspecified: Secondary | ICD-10-CM

## 2024-07-07 DIAGNOSIS — Z91199 Personal history of noncompliance with medical treatment, presenting hazards to health: Secondary | ICD-10-CM

## 2024-07-07 DIAGNOSIS — M8580 Other specified disorders of bone density and structure, unspecified site: Secondary | ICD-10-CM

## 2024-07-07 DIAGNOSIS — J449 Chronic obstructive pulmonary disease, unspecified: Secondary | ICD-10-CM

## 2024-07-07 DIAGNOSIS — F32A Depression: Secondary | ICD-10-CM

## 2024-07-07 DIAGNOSIS — Z9189 Other specified personal risk factors, not elsewhere classified: Secondary | ICD-10-CM

## 2024-07-07 DIAGNOSIS — R011 Cardiac murmur, unspecified: Secondary | ICD-10-CM

## 2024-07-07 DIAGNOSIS — I2699 Other pulmonary embolism without acute cor pulmonale: Secondary | ICD-10-CM

## 2024-07-07 DIAGNOSIS — IMO0002 Lupus: Secondary | ICD-10-CM

## 2024-07-07 DIAGNOSIS — I6350 Cerebral infarction due to unspecified occlusion or stenosis of unspecified cerebral artery: Secondary | ICD-10-CM

## 2024-07-07 DIAGNOSIS — R251 Tremor, unspecified: Secondary | ICD-10-CM

## 2024-07-07 DIAGNOSIS — G43009 Migraine without aura, not intractable, without status migrainosus: Principal | ICD-10-CM

## 2024-07-07 DIAGNOSIS — F419 Anxiety disorder, unspecified: Secondary | ICD-10-CM

## 2024-07-07 MED ORDER — AIMOVIG 140 MG/ML SC SOAJ
140 mg | SUBCUTANEOUS | 1 refills | 30.00000 days | Status: CP
Start: 2024-07-07 — End: ?

## 2024-07-08 ENCOUNTER — Encounter: Payer: MEDICARE | Primary: Internal Medicine

## 2024-07-15 ENCOUNTER — Encounter: Payer: MEDICARE | Attending: Mental Health | Primary: Internal Medicine

## 2024-07-19 ENCOUNTER — Encounter: Payer: MEDICARE | Primary: Internal Medicine

## 2024-07-20 ENCOUNTER — Encounter: Payer: MEDICARE | Primary: Internal Medicine

## 2024-07-22 ENCOUNTER — Encounter: Payer: MEDICARE | Attending: Mental Health | Primary: Internal Medicine

## 2024-07-26 ENCOUNTER — Telehealth: Payer: MEDICARE | Attending: Mental Health | Primary: Internal Medicine

## 2024-07-26 DIAGNOSIS — R011 Cardiac murmur, unspecified: Secondary | ICD-10-CM

## 2024-07-26 DIAGNOSIS — R251 Tremor, unspecified: Secondary | ICD-10-CM

## 2024-07-26 DIAGNOSIS — I6350 Cerebral infarction due to unspecified occlusion or stenosis of unspecified cerebral artery: Secondary | ICD-10-CM

## 2024-07-26 DIAGNOSIS — D649 Anemia, unspecified: Secondary | ICD-10-CM

## 2024-07-26 DIAGNOSIS — Z9109 Other allergy status, other than to drugs and biological substances: Secondary | ICD-10-CM

## 2024-07-26 DIAGNOSIS — S0510XA Contusion of eyeball and orbital tissues, unspecified eye, initial encounter: Secondary | ICD-10-CM

## 2024-07-26 DIAGNOSIS — Z91199 Personal history of noncompliance with medical treatment, presenting hazards to health: Secondary | ICD-10-CM

## 2024-07-26 DIAGNOSIS — M8580 Other specified disorders of bone density and structure, unspecified site: Secondary | ICD-10-CM

## 2024-07-26 DIAGNOSIS — K219 Gastro-esophageal reflux disease without esophagitis: Secondary | ICD-10-CM

## 2024-07-26 DIAGNOSIS — G43009 Migraine without aura, not intractable, without status migrainosus: Principal | ICD-10-CM

## 2024-07-26 DIAGNOSIS — I10 Essential (primary) hypertension: Principal | ICD-10-CM

## 2024-07-26 DIAGNOSIS — G459 Transient cerebral ischemic attack, unspecified: Secondary | ICD-10-CM

## 2024-07-26 DIAGNOSIS — I8290 Acute embolism and thrombosis of unspecified vein: Secondary | ICD-10-CM

## 2024-07-26 DIAGNOSIS — N3281 Overactive bladder: Secondary | ICD-10-CM

## 2024-07-26 DIAGNOSIS — E785 Hyperlipidemia, unspecified: Secondary | ICD-10-CM

## 2024-07-26 DIAGNOSIS — F32A Depression: Secondary | ICD-10-CM

## 2024-07-26 DIAGNOSIS — G43909 Migraine, unspecified, not intractable, without status migrainosus: Secondary | ICD-10-CM

## 2024-07-26 DIAGNOSIS — Z9189 Other specified personal risk factors, not elsewhere classified: Secondary | ICD-10-CM

## 2024-07-26 DIAGNOSIS — F4310 Post-traumatic stress disorder, unspecified: Principal | ICD-10-CM

## 2024-07-26 DIAGNOSIS — M3500 Sicca syndrome, unspecified: Secondary | ICD-10-CM

## 2024-07-26 DIAGNOSIS — I2510 Atherosclerotic heart disease of native coronary artery without angina pectoris: Secondary | ICD-10-CM

## 2024-07-26 DIAGNOSIS — I2699 Other pulmonary embolism without acute cor pulmonale: Secondary | ICD-10-CM

## 2024-07-26 DIAGNOSIS — M1990 Unspecified osteoarthritis, unspecified site: Secondary | ICD-10-CM

## 2024-07-26 DIAGNOSIS — F419 Anxiety disorder, unspecified: Secondary | ICD-10-CM

## 2024-07-26 DIAGNOSIS — J449 Chronic obstructive pulmonary disease, unspecified: Secondary | ICD-10-CM

## 2024-07-26 DIAGNOSIS — IMO0002 Lupus: Secondary | ICD-10-CM

## 2024-07-27 MED ORDER — LASMIDITAN SUCCINATE 100 MG PO TABS
100 mg | ORAL | 1 refills | PRN
Start: 2024-07-27 — End: ?

## 2024-08-09 ENCOUNTER — Encounter: Payer: MEDICARE | Attending: Mental Health | Primary: Internal Medicine

## 2024-08-11 ENCOUNTER — Telehealth: Payer: MEDICARE | Primary: Internal Medicine

## 2024-08-11 DIAGNOSIS — F419 Anxiety disorder, unspecified: Secondary | ICD-10-CM

## 2024-08-11 DIAGNOSIS — F331 Major depressive disorder, recurrent, moderate: Secondary | ICD-10-CM

## 2024-08-11 DIAGNOSIS — K219 Gastro-esophageal reflux disease without esophagitis: Secondary | ICD-10-CM

## 2024-08-11 DIAGNOSIS — D649 Anemia, unspecified: Secondary | ICD-10-CM

## 2024-08-11 DIAGNOSIS — F32A Depression: Secondary | ICD-10-CM

## 2024-08-11 DIAGNOSIS — Z91199 Personal history of noncompliance with medical treatment, presenting hazards to health: Secondary | ICD-10-CM

## 2024-08-11 DIAGNOSIS — Z9189 Other specified personal risk factors, not elsewhere classified: Secondary | ICD-10-CM

## 2024-08-11 DIAGNOSIS — N3281 Overactive bladder: Secondary | ICD-10-CM

## 2024-08-11 DIAGNOSIS — I8290 Acute embolism and thrombosis of unspecified vein: Secondary | ICD-10-CM

## 2024-08-11 DIAGNOSIS — I2510 Atherosclerotic heart disease of native coronary artery without angina pectoris: Secondary | ICD-10-CM

## 2024-08-11 DIAGNOSIS — M1990 Unspecified osteoarthritis, unspecified site: Secondary | ICD-10-CM

## 2024-08-11 DIAGNOSIS — G43909 Migraine, unspecified, not intractable, without status migrainosus: Secondary | ICD-10-CM

## 2024-08-11 DIAGNOSIS — J449 Chronic obstructive pulmonary disease, unspecified: Secondary | ICD-10-CM

## 2024-08-11 DIAGNOSIS — E785 Hyperlipidemia, unspecified: Secondary | ICD-10-CM

## 2024-08-11 DIAGNOSIS — IMO0002 Lupus: Secondary | ICD-10-CM

## 2024-08-11 DIAGNOSIS — Z9109 Other allergy status, other than to drugs and biological substances: Secondary | ICD-10-CM

## 2024-08-11 DIAGNOSIS — M797 Fibromyalgia: Principal | ICD-10-CM

## 2024-08-11 DIAGNOSIS — S0510XA Contusion of eyeball and orbital tissues, unspecified eye, initial encounter: Secondary | ICD-10-CM

## 2024-08-11 DIAGNOSIS — R251 Tremor, unspecified: Secondary | ICD-10-CM

## 2024-08-11 DIAGNOSIS — F4310 Post-traumatic stress disorder, unspecified: Secondary | ICD-10-CM

## 2024-08-11 DIAGNOSIS — G459 Transient cerebral ischemic attack, unspecified: Secondary | ICD-10-CM

## 2024-08-11 DIAGNOSIS — I10 Essential (primary) hypertension: Principal | ICD-10-CM

## 2024-08-11 DIAGNOSIS — I6350 Cerebral infarction due to unspecified occlusion or stenosis of unspecified cerebral artery: Secondary | ICD-10-CM

## 2024-08-11 DIAGNOSIS — I2699 Other pulmonary embolism without acute cor pulmonale: Secondary | ICD-10-CM

## 2024-08-11 DIAGNOSIS — R011 Cardiac murmur, unspecified: Secondary | ICD-10-CM

## 2024-08-11 DIAGNOSIS — M8580 Other specified disorders of bone density and structure, unspecified site: Secondary | ICD-10-CM

## 2024-08-11 DIAGNOSIS — M3500 Sicca syndrome, unspecified: Secondary | ICD-10-CM

## 2024-08-11 MED ORDER — ARIPIPRAZOLE 5 MG PO TABS
5 mg | Freq: Every day | ORAL | 0 refills | 30.00000 days | Status: CP
Start: 2024-08-11 — End: ?

## 2024-08-17 DIAGNOSIS — Z6832 Body mass index (BMI) 32.0-32.9, adult: Principal | ICD-10-CM

## 2024-08-18 MED ORDER — MOUNJARO 15 MG/0.5ML SC SOAJ
SUBCUTANEOUS | 2 refills | 28.00000 days | Status: CP
Start: 2024-08-18 — End: ?

## 2024-08-25 ENCOUNTER — Telehealth: Payer: MEDICARE | Attending: Mental Health | Primary: Internal Medicine

## 2024-08-25 DIAGNOSIS — I2510 Atherosclerotic heart disease of native coronary artery without angina pectoris: Secondary | ICD-10-CM

## 2024-08-25 DIAGNOSIS — Z9189 Other specified personal risk factors, not elsewhere classified: Secondary | ICD-10-CM

## 2024-08-25 DIAGNOSIS — Z9109 Other allergy status, other than to drugs and biological substances: Secondary | ICD-10-CM

## 2024-08-25 DIAGNOSIS — M1990 Unspecified osteoarthritis, unspecified site: Secondary | ICD-10-CM

## 2024-08-25 DIAGNOSIS — R251 Tremor, unspecified: Secondary | ICD-10-CM

## 2024-08-25 DIAGNOSIS — S0510XA Contusion of eyeball and orbital tissues, unspecified eye, initial encounter: Secondary | ICD-10-CM

## 2024-08-25 DIAGNOSIS — F4310 Post-traumatic stress disorder, unspecified: Principal | ICD-10-CM

## 2024-08-25 DIAGNOSIS — R011 Cardiac murmur, unspecified: Secondary | ICD-10-CM

## 2024-08-25 DIAGNOSIS — G43909 Migraine, unspecified, not intractable, without status migrainosus: Secondary | ICD-10-CM

## 2024-08-25 DIAGNOSIS — K219 Gastro-esophageal reflux disease without esophagitis: Secondary | ICD-10-CM

## 2024-08-25 DIAGNOSIS — E785 Hyperlipidemia, unspecified: Secondary | ICD-10-CM

## 2024-08-25 DIAGNOSIS — N3281 Overactive bladder: Secondary | ICD-10-CM

## 2024-08-25 DIAGNOSIS — I8290 Acute embolism and thrombosis of unspecified vein: Secondary | ICD-10-CM

## 2024-08-25 DIAGNOSIS — I6350 Cerebral infarction due to unspecified occlusion or stenosis of unspecified cerebral artery: Secondary | ICD-10-CM

## 2024-08-25 DIAGNOSIS — D649 Anemia, unspecified: Secondary | ICD-10-CM

## 2024-08-25 DIAGNOSIS — M8580 Other specified disorders of bone density and structure, unspecified site: Secondary | ICD-10-CM

## 2024-08-25 DIAGNOSIS — F32A Depression: Secondary | ICD-10-CM

## 2024-08-25 DIAGNOSIS — M3500 Sicca syndrome, unspecified: Secondary | ICD-10-CM

## 2024-08-25 DIAGNOSIS — F419 Anxiety disorder, unspecified: Secondary | ICD-10-CM

## 2024-08-25 DIAGNOSIS — I10 Essential (primary) hypertension: Principal | ICD-10-CM

## 2024-08-25 DIAGNOSIS — J449 Chronic obstructive pulmonary disease, unspecified: Secondary | ICD-10-CM

## 2024-08-25 DIAGNOSIS — Z91199 Personal history of noncompliance with medical treatment, presenting hazards to health: Secondary | ICD-10-CM

## 2024-08-25 DIAGNOSIS — IMO0002 Lupus: Secondary | ICD-10-CM

## 2024-08-25 DIAGNOSIS — I2699 Other pulmonary embolism without acute cor pulmonale: Secondary | ICD-10-CM

## 2024-08-25 DIAGNOSIS — G459 Transient cerebral ischemic attack, unspecified: Secondary | ICD-10-CM

## 2024-08-31 DIAGNOSIS — M797 Fibromyalgia: Principal | ICD-10-CM

## 2024-08-31 MED ORDER — ARIPIPRAZOLE 5 MG PO TABS
5 mg | Freq: Every day | ORAL | 11 refills
Start: 2024-08-31 — End: ?

## 2024-09-01 ENCOUNTER — Telehealth: Primary: Internal Medicine

## 2024-09-01 DIAGNOSIS — F4310 Post-traumatic stress disorder, unspecified: Secondary | ICD-10-CM

## 2024-09-01 DIAGNOSIS — Z9109 Other allergy status, other than to drugs and biological substances: Secondary | ICD-10-CM

## 2024-09-01 DIAGNOSIS — D649 Anemia, unspecified: Secondary | ICD-10-CM

## 2024-09-01 DIAGNOSIS — S0510XA Contusion of eyeball and orbital tissues, unspecified eye, initial encounter: Secondary | ICD-10-CM

## 2024-09-01 DIAGNOSIS — J449 Chronic obstructive pulmonary disease, unspecified: Secondary | ICD-10-CM

## 2024-09-01 DIAGNOSIS — R251 Tremor, unspecified: Secondary | ICD-10-CM

## 2024-09-01 DIAGNOSIS — F331 Major depressive disorder, recurrent, moderate: Secondary | ICD-10-CM

## 2024-09-01 DIAGNOSIS — G43909 Migraine, unspecified, not intractable, without status migrainosus: Secondary | ICD-10-CM

## 2024-09-01 DIAGNOSIS — I2510 Atherosclerotic heart disease of native coronary artery without angina pectoris: Secondary | ICD-10-CM

## 2024-09-01 DIAGNOSIS — M1990 Unspecified osteoarthritis, unspecified site: Secondary | ICD-10-CM

## 2024-09-01 DIAGNOSIS — I6350 Cerebral infarction due to unspecified occlusion or stenosis of unspecified cerebral artery: Secondary | ICD-10-CM

## 2024-09-01 DIAGNOSIS — M3500 Sicca syndrome, unspecified: Secondary | ICD-10-CM

## 2024-09-01 DIAGNOSIS — F419 Anxiety disorder, unspecified: Secondary | ICD-10-CM

## 2024-09-01 DIAGNOSIS — Z91199 Personal history of noncompliance with medical treatment, presenting hazards to health: Secondary | ICD-10-CM

## 2024-09-01 DIAGNOSIS — E785 Hyperlipidemia, unspecified: Secondary | ICD-10-CM

## 2024-09-01 DIAGNOSIS — R011 Cardiac murmur, unspecified: Secondary | ICD-10-CM

## 2024-09-01 DIAGNOSIS — IMO0002 Lupus: Secondary | ICD-10-CM

## 2024-09-01 DIAGNOSIS — N3281 Overactive bladder: Secondary | ICD-10-CM

## 2024-09-01 DIAGNOSIS — F32A Depression: Secondary | ICD-10-CM

## 2024-09-01 DIAGNOSIS — K219 Gastro-esophageal reflux disease without esophagitis: Secondary | ICD-10-CM

## 2024-09-01 DIAGNOSIS — M8580 Other specified disorders of bone density and structure, unspecified site: Secondary | ICD-10-CM

## 2024-09-01 DIAGNOSIS — Z9189 Other specified personal risk factors, not elsewhere classified: Secondary | ICD-10-CM

## 2024-09-01 DIAGNOSIS — I10 Essential (primary) hypertension: Principal | ICD-10-CM

## 2024-09-01 DIAGNOSIS — I2699 Other pulmonary embolism without acute cor pulmonale: Secondary | ICD-10-CM

## 2024-09-01 DIAGNOSIS — I8290 Acute embolism and thrombosis of unspecified vein: Secondary | ICD-10-CM

## 2024-09-01 DIAGNOSIS — G459 Transient cerebral ischemic attack, unspecified: Secondary | ICD-10-CM

## 2024-09-01 MED ORDER — SERTRALINE HCL 100 MG PO TABS
150 mg | Freq: Every day | ORAL | 0 refills | 30.00000 days | Status: CP
Start: 2024-09-01 — End: ?

## 2024-09-08 ENCOUNTER — Telehealth: Attending: Mental Health | Primary: Internal Medicine

## 2024-09-08 DIAGNOSIS — D649 Anemia, unspecified: Secondary | ICD-10-CM

## 2024-09-08 DIAGNOSIS — I8290 Acute embolism and thrombosis of unspecified vein: Secondary | ICD-10-CM

## 2024-09-08 DIAGNOSIS — I10 Essential (primary) hypertension: Principal | ICD-10-CM

## 2024-09-08 DIAGNOSIS — J449 Chronic obstructive pulmonary disease, unspecified: Secondary | ICD-10-CM

## 2024-09-08 DIAGNOSIS — I2510 Atherosclerotic heart disease of native coronary artery without angina pectoris: Secondary | ICD-10-CM

## 2024-09-08 DIAGNOSIS — F32A Depression: Secondary | ICD-10-CM

## 2024-09-08 DIAGNOSIS — I6350 Cerebral infarction due to unspecified occlusion or stenosis of unspecified cerebral artery: Secondary | ICD-10-CM

## 2024-09-08 DIAGNOSIS — G459 Transient cerebral ischemic attack, unspecified: Secondary | ICD-10-CM

## 2024-09-08 DIAGNOSIS — M8580 Other specified disorders of bone density and structure, unspecified site: Secondary | ICD-10-CM

## 2024-09-08 DIAGNOSIS — R251 Tremor, unspecified: Secondary | ICD-10-CM

## 2024-09-08 DIAGNOSIS — Z9189 Other specified personal risk factors, not elsewhere classified: Secondary | ICD-10-CM

## 2024-09-08 DIAGNOSIS — E785 Hyperlipidemia, unspecified: Secondary | ICD-10-CM

## 2024-09-08 DIAGNOSIS — Z91199 Personal history of noncompliance with medical treatment, presenting hazards to health: Secondary | ICD-10-CM

## 2024-09-08 DIAGNOSIS — M1990 Unspecified osteoarthritis, unspecified site: Secondary | ICD-10-CM

## 2024-09-08 DIAGNOSIS — I2699 Other pulmonary embolism without acute cor pulmonale: Secondary | ICD-10-CM

## 2024-09-08 DIAGNOSIS — N3281 Overactive bladder: Secondary | ICD-10-CM

## 2024-09-08 DIAGNOSIS — M3500 Sicca syndrome, unspecified: Secondary | ICD-10-CM

## 2024-09-08 DIAGNOSIS — F419 Anxiety disorder, unspecified: Secondary | ICD-10-CM

## 2024-09-08 DIAGNOSIS — G43909 Migraine, unspecified, not intractable, without status migrainosus: Secondary | ICD-10-CM

## 2024-09-08 DIAGNOSIS — K219 Gastro-esophageal reflux disease without esophagitis: Secondary | ICD-10-CM

## 2024-09-08 DIAGNOSIS — S0510XA Contusion of eyeball and orbital tissues, unspecified eye, initial encounter: Secondary | ICD-10-CM

## 2024-09-08 DIAGNOSIS — R011 Cardiac murmur, unspecified: Secondary | ICD-10-CM

## 2024-09-08 DIAGNOSIS — IMO0002 Lupus: Secondary | ICD-10-CM

## 2024-09-08 DIAGNOSIS — Z9109 Other allergy status, other than to drugs and biological substances: Secondary | ICD-10-CM

## 2024-09-08 DIAGNOSIS — F4310 Post-traumatic stress disorder, unspecified: Principal | ICD-10-CM

## 2024-09-15 DIAGNOSIS — F331 Major depressive disorder, recurrent, moderate: Secondary | ICD-10-CM

## 2024-09-15 DIAGNOSIS — F4310 Post-traumatic stress disorder, unspecified: Secondary | ICD-10-CM

## 2024-09-16 MED ORDER — SERTRALINE HCL 100 MG PO TABS
150 mg | Freq: Every day | ORAL | 11 refills
Start: 2024-09-16 — End: ?

## 2024-09-17 DIAGNOSIS — M797 Fibromyalgia: Principal | ICD-10-CM

## 2024-09-20 MED ORDER — PREGABALIN 100 MG PO CAPS
100 mg | Freq: Two times a day (BID) | ORAL | 2 refills | 28.00000 days | Status: CP
Start: 2024-09-20 — End: ?

## 2024-09-22 ENCOUNTER — Encounter: Attending: Mental Health | Primary: Internal Medicine

## 2024-09-29 ENCOUNTER — Encounter: Primary: Internal Medicine

## 2024-10-06 ENCOUNTER — Telehealth: Attending: Mental Health | Primary: Internal Medicine

## 2024-10-06 ENCOUNTER — Encounter: Primary: Internal Medicine

## 2024-10-06 DIAGNOSIS — I2510 Atherosclerotic heart disease of native coronary artery without angina pectoris: Secondary | ICD-10-CM

## 2024-10-06 DIAGNOSIS — I10 Essential (primary) hypertension: Principal | ICD-10-CM

## 2024-10-06 DIAGNOSIS — Z9109 Other allergy status, other than to drugs and biological substances: Secondary | ICD-10-CM

## 2024-10-06 DIAGNOSIS — J449 Chronic obstructive pulmonary disease, unspecified: Secondary | ICD-10-CM

## 2024-10-06 DIAGNOSIS — F419 Anxiety disorder, unspecified: Secondary | ICD-10-CM

## 2024-10-06 DIAGNOSIS — E785 Hyperlipidemia, unspecified: Secondary | ICD-10-CM

## 2024-10-06 DIAGNOSIS — F32A Depression: Secondary | ICD-10-CM

## 2024-10-06 DIAGNOSIS — I2699 Other pulmonary embolism without acute cor pulmonale: Secondary | ICD-10-CM

## 2024-10-06 DIAGNOSIS — M8580 Other specified disorders of bone density and structure, unspecified site: Secondary | ICD-10-CM

## 2024-10-06 DIAGNOSIS — I8290 Acute embolism and thrombosis of unspecified vein: Secondary | ICD-10-CM

## 2024-10-06 DIAGNOSIS — G43909 Migraine, unspecified, not intractable, without status migrainosus: Secondary | ICD-10-CM

## 2024-10-06 DIAGNOSIS — G459 Transient cerebral ischemic attack, unspecified: Secondary | ICD-10-CM

## 2024-10-06 DIAGNOSIS — M1990 Unspecified osteoarthritis, unspecified site: Secondary | ICD-10-CM

## 2024-10-06 DIAGNOSIS — N3281 Overactive bladder: Secondary | ICD-10-CM

## 2024-10-06 DIAGNOSIS — Z9189 Other specified personal risk factors, not elsewhere classified: Secondary | ICD-10-CM

## 2024-10-06 DIAGNOSIS — S0510XA Contusion of eyeball and orbital tissues, unspecified eye, initial encounter: Secondary | ICD-10-CM

## 2024-10-06 DIAGNOSIS — K219 Gastro-esophageal reflux disease without esophagitis: Secondary | ICD-10-CM

## 2024-10-06 DIAGNOSIS — IMO0002 Lupus: Secondary | ICD-10-CM

## 2024-10-06 DIAGNOSIS — M3500 Sicca syndrome, unspecified: Secondary | ICD-10-CM

## 2024-10-06 DIAGNOSIS — Z91199 Personal history of noncompliance with medical treatment, presenting hazards to health: Secondary | ICD-10-CM

## 2024-10-06 DIAGNOSIS — R251 Tremor, unspecified: Secondary | ICD-10-CM

## 2024-10-06 DIAGNOSIS — F4310 Post-traumatic stress disorder, unspecified: Principal | ICD-10-CM

## 2024-10-06 DIAGNOSIS — I6350 Cerebral infarction due to unspecified occlusion or stenosis of unspecified cerebral artery: Secondary | ICD-10-CM

## 2024-10-06 DIAGNOSIS — D649 Anemia, unspecified: Secondary | ICD-10-CM

## 2024-10-06 DIAGNOSIS — R011 Cardiac murmur, unspecified: Secondary | ICD-10-CM

## 2024-10-12 ENCOUNTER — Encounter: Primary: Internal Medicine

## 2024-10-18 ENCOUNTER — Encounter: Attending: Mental Health | Primary: Internal Medicine

## 2024-11-01 ENCOUNTER — Encounter: Attending: Mental Health | Primary: Internal Medicine

## 2024-12-17 ENCOUNTER — Ambulatory Visit: Payer: MEDICARE | Primary: Internal Medicine

## 2024-12-17 DIAGNOSIS — F419 Anxiety disorder, unspecified: Secondary | ICD-10-CM

## 2024-12-17 DIAGNOSIS — I2510 Atherosclerotic heart disease of native coronary artery without angina pectoris: Secondary | ICD-10-CM

## 2024-12-17 DIAGNOSIS — Z6831 Body mass index (BMI) 31.0-31.9, adult: Secondary | ICD-10-CM

## 2024-12-17 DIAGNOSIS — M1990 Unspecified osteoarthritis, unspecified site: Secondary | ICD-10-CM

## 2024-12-17 DIAGNOSIS — J449 Chronic obstructive pulmonary disease, unspecified: Secondary | ICD-10-CM

## 2024-12-17 DIAGNOSIS — G459 Transient cerebral ischemic attack, unspecified: Secondary | ICD-10-CM

## 2024-12-17 DIAGNOSIS — G43909 Migraine, unspecified, not intractable, without status migrainosus: Secondary | ICD-10-CM

## 2024-12-17 DIAGNOSIS — Z7689 Persons encountering health services in other specified circumstances: Secondary | ICD-10-CM

## 2024-12-17 DIAGNOSIS — I6350 Cerebral infarction due to unspecified occlusion or stenosis of unspecified cerebral artery: Secondary | ICD-10-CM

## 2024-12-17 DIAGNOSIS — K219 Gastro-esophageal reflux disease without esophagitis: Secondary | ICD-10-CM

## 2024-12-17 DIAGNOSIS — K5903 Drug induced constipation: Secondary | ICD-10-CM

## 2024-12-17 DIAGNOSIS — S0510XA Contusion of eyeball and orbital tissues, unspecified eye, initial encounter: Secondary | ICD-10-CM

## 2024-12-17 DIAGNOSIS — IMO0002 Lupus: Secondary | ICD-10-CM

## 2024-12-17 DIAGNOSIS — M797 Fibromyalgia: Secondary | ICD-10-CM

## 2024-12-17 DIAGNOSIS — I10 Essential (primary) hypertension: Principal | ICD-10-CM

## 2024-12-17 DIAGNOSIS — F323 Major depressive disorder, single episode, severe with psychotic features: Principal | ICD-10-CM

## 2024-12-17 DIAGNOSIS — E785 Hyperlipidemia, unspecified: Secondary | ICD-10-CM

## 2024-12-17 DIAGNOSIS — I8290 Acute embolism and thrombosis of unspecified vein: Secondary | ICD-10-CM

## 2024-12-17 DIAGNOSIS — R011 Cardiac murmur, unspecified: Secondary | ICD-10-CM

## 2024-12-17 DIAGNOSIS — M8580 Other specified disorders of bone density and structure, unspecified site: Secondary | ICD-10-CM

## 2024-12-17 DIAGNOSIS — Z713 Dietary counseling and surveillance: Secondary | ICD-10-CM

## 2024-12-17 DIAGNOSIS — I2699 Other pulmonary embolism without acute cor pulmonale: Secondary | ICD-10-CM

## 2024-12-17 DIAGNOSIS — F32A Depression: Secondary | ICD-10-CM

## 2024-12-17 DIAGNOSIS — R251 Tremor, unspecified: Secondary | ICD-10-CM

## 2024-12-17 DIAGNOSIS — Z9189 Other specified personal risk factors, not elsewhere classified: Secondary | ICD-10-CM

## 2024-12-17 DIAGNOSIS — D649 Anemia, unspecified: Secondary | ICD-10-CM

## 2024-12-17 DIAGNOSIS — R7309 Other abnormal glucose: Secondary | ICD-10-CM

## 2024-12-17 DIAGNOSIS — Z91199 Personal history of noncompliance with medical treatment, presenting hazards to health: Secondary | ICD-10-CM

## 2024-12-17 DIAGNOSIS — M3500 Sicca syndrome, unspecified: Secondary | ICD-10-CM

## 2024-12-17 DIAGNOSIS — N3281 Overactive bladder: Secondary | ICD-10-CM

## 2024-12-17 DIAGNOSIS — Z9109 Other allergy status, other than to drugs and biological substances: Secondary | ICD-10-CM

## 2024-12-17 MED ORDER — MOUNJARO 15 MG/0.5ML SC SOAJ
15 mg | SUBCUTANEOUS | 2 refills | 28.00000 days | Status: CP
Start: 2024-12-17 — End: ?

## 2024-12-17 MED ORDER — MOUNJARO 15 MG/0.5ML SC SOAJ
15 mg | SUBCUTANEOUS | 2 refills | 28.00000 days | Status: CP
Start: 2024-12-17 — End: 2024-12-17

## 2024-12-17 MED ORDER — PREGABALIN 100 MG PO CAPS
100 mg | Freq: Two times a day (BID) | ORAL | 2 refills | 30.00000 days | Status: CP
Start: 2024-12-17 — End: ?

## 2024-12-17 MED ORDER — ARIPIPRAZOLE 5 MG PO TABS
5 mg | Freq: Every day | ORAL | 0 refills | 30.00000 days | Status: CP
Start: 2024-12-17 — End: ?

## 2024-12-17 MED ORDER — LACTULOSE 10 GM/15ML PO SOLN
20 g | Freq: Every day | ORAL | 0 refills | 8.00000 days | Status: CP | PRN
Start: 2024-12-17 — End: ?

## 2024-12-21 DIAGNOSIS — K5903 Drug induced constipation: Principal | ICD-10-CM

## 2024-12-21 MED ORDER — LACTULOSE 10 GM/15ML PO SOLN
0 refills
Start: 2024-12-21 — End: ?

## 2024-12-22 ENCOUNTER — Encounter: Payer: MEDICARE | Attending: Mental Health | Primary: Internal Medicine
